# Patient Record
Sex: Male | Born: 1990
Health system: Southern US, Community
[De-identification: ages and names within clinical notes are randomized; demographics above are authoritative.]

## PROBLEM LIST (undated history)

## (undated) DIAGNOSIS — E119 Type 2 diabetes mellitus without complications: Secondary | ICD-10-CM

## (undated) HISTORY — PX: NO PAST SURGERIES: SHX2092

---
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
## 2010-05-23 ENCOUNTER — Ambulatory Visit (INDEPENDENT_AMBULATORY_CARE_PROVIDER_SITE_OTHER): Payer: BC Managed Care – PPO

## 2010-05-23 ENCOUNTER — Inpatient Hospital Stay (INDEPENDENT_AMBULATORY_CARE_PROVIDER_SITE_OTHER)
Admission: RE | Admit: 2010-05-23 | Discharge: 2010-05-23 | Disposition: A | Discharge status: Home / Self Care | Payer: BC Managed Care – PPO | Source: Ambulatory Visit | Attending: Family Medicine | Admitting: Family Medicine

## 2010-05-23 DIAGNOSIS — M779 Enthesopathy, unspecified: Secondary | ICD-10-CM

## 2011-02-10 ENCOUNTER — Emergency Department (INDEPENDENT_AMBULATORY_CARE_PROVIDER_SITE_OTHER)
Admission: EM | Admit: 2011-02-10 | Discharge: 2011-02-10 | Disposition: A | Discharge status: Home / Self Care | Payer: Self-pay | Source: Home / Self Care | Attending: Family Medicine | Admitting: Family Medicine

## 2011-02-10 DIAGNOSIS — J069 Acute upper respiratory infection, unspecified: Secondary | ICD-10-CM

## 2011-02-10 NOTE — ED Notes (Signed)
Patient complains of headache, dizziness, nausea and weakness since yesterday.  MD in room examining patient.

## 2011-02-10 NOTE — ED Provider Notes (Signed)
History     CSN: [id number] Arrival date & time: 02/10/2011  6:31 PM   First MD Initiated Contact with Patient 02/10/11 1810      No chief complaint on file.   (Consider location/radiation/quality/duration/timing/severity/associated sxs/prior treatment) HPI Comments: Dylan Watts presents for evaluation of fever, body aches, nausea, and non-productive cough since yesterday. He reports decreased appetite.   Patient is a 20 y.o. male presenting with URI. The history is provided by the patient.  URI The primary symptoms include fever, fatigue, headaches, sore throat, cough, nausea, vomiting and myalgias. The current episode started yesterday. This is a new problem. The problem has not changed since onset. The fever began yesterday. The fever has been unchanged since its onset. The maximum temperature recorded prior to his arrival was unknown.  The headache began yesterday. Headache is a new problem. The headache is not associated with aura, photophobia, eye pain or paresthesias.  Symptoms associated with the illness include chills, congestion and rhinorrhea.    No past medical history on file.  No past surgical history on file.  No family history on file.  History  Substance Use Topics  . Smoking status: Not on file  . Smokeless tobacco: Not on file  . Alcohol Use: Not on file      Review of Systems  Constitutional: Positive for fever, chills and fatigue.  HENT: Positive for congestion, sore throat, rhinorrhea and voice change.   Eyes: Negative.  Negative for photophobia and pain.  Respiratory: Positive for cough.   Gastrointestinal: Positive for nausea and vomiting.  Genitourinary: Negative.   Musculoskeletal: Positive for myalgias.  Skin: Negative.   Neurological: Positive for headaches. Negative for paresthesias.    Allergies  Review of patient's allergies indicates not on file.  Home Medications  No current outpatient prescriptions on file.  BP 128/86  Pulse 110   Temp(Src) 100.1 F (37.8 C) (Oral)  Resp 20  SpO2 98%  Physical Exam  Constitutional: He is oriented to person, place, and time. He appears well-developed and well-nourished.  HENT:  Head: Normocephalic and atraumatic.  Right Ear: Tympanic membrane and external ear normal.  Left Ear: Tympanic membrane and external ear normal.  Mouth/Throat: Uvula is midline, oropharynx is clear and moist and mucous membranes are normal. No oropharyngeal exudate, posterior oropharyngeal edema or posterior oropharyngeal erythema.  Eyes: Conjunctivae and EOM are normal. Pupils are equal, round, and reactive to light.  Neck: Normal range of motion.  Cardiovascular: Normal rate and regular rhythm.   Pulmonary/Chest: Effort normal and breath sounds normal. He has no wheezes. He has no rhonchi. He has no rales.  Neurological: He is alert and oriented to person, place, and time.  Skin: Skin is warm and dry.    ED Course  Procedures (including critical care time)  Labs Reviewed - No data to display No results found.   No diagnosis found.    MDM          Dylan Priest, MD 02/10/11 [phone]

## 2011-02-10 NOTE — ED Notes (Signed)
Patient given discharge instructions by Dr Juanetta Gosling, expressed understanding and had all questions answered.  Pt given work note to return on Monday

## 2011-02-17 NOTE — ED Notes (Signed)
Patient triaged by Rosalie Gums, rad tech.  This nurse did not assess or collect data for this patient

## 2014-11-28 ENCOUNTER — Encounter (HOSPITAL_COMMUNITY): Payer: Self-pay

## 2014-11-28 ENCOUNTER — Emergency Department (HOSPITAL_COMMUNITY): Payer: BLUE CROSS/BLUE SHIELD

## 2014-11-28 ENCOUNTER — Emergency Department (HOSPITAL_COMMUNITY)
Admission: EM | Admit: 2014-11-28 | Discharge: 2014-11-28 | Disposition: A | Discharge status: Home / Self Care | Payer: BLUE CROSS/BLUE SHIELD | Attending: Emergency Medicine | Admitting: Emergency Medicine

## 2014-11-28 DIAGNOSIS — R630 Anorexia: Secondary | ICD-10-CM | POA: Insufficient documentation

## 2014-11-28 DIAGNOSIS — R55 Syncope and collapse: Secondary | ICD-10-CM | POA: Diagnosis not present

## 2014-11-28 DIAGNOSIS — R000 Tachycardia, unspecified: Secondary | ICD-10-CM | POA: Insufficient documentation

## 2014-11-28 DIAGNOSIS — R110 Nausea: Secondary | ICD-10-CM | POA: Insufficient documentation

## 2014-11-28 DIAGNOSIS — R42 Dizziness and giddiness: Secondary | ICD-10-CM | POA: Insufficient documentation

## 2014-11-28 DIAGNOSIS — J069 Acute upper respiratory infection, unspecified: Secondary | ICD-10-CM | POA: Diagnosis not present

## 2014-11-28 LAB — CBC
HCT: 41.2 % (ref 39.0–52.0)
HEMOGLOBIN: 13.8 g/dL (ref 13.0–17.0)
MCH: 29.1 pg (ref 26.0–34.0)
MCHC: 33.5 g/dL (ref 30.0–36.0)
MCV: 86.9 fL (ref 78.0–100.0)
Platelets: 313 10*3/uL (ref 150–400)
RBC: 4.74 MIL/uL (ref 4.22–5.81)
RDW: 12.9 % (ref 11.5–15.5)
WBC: 16.1 10*3/uL — ABNORMAL HIGH (ref 4.0–10.5)

## 2014-11-28 LAB — BASIC METABOLIC PANEL
ANION GAP: 11 (ref 5–15)
BUN: 6 mg/dL (ref 6–20)
CALCIUM: 9.2 mg/dL (ref 8.9–10.3)
CO2: 25 mmol/L (ref 22–32)
Chloride: 98 mmol/L — ABNORMAL LOW (ref 101–111)
Creatinine, Ser: 1.15 mg/dL (ref 0.61–1.24)
GFR calc non Af Amer: >60 mL/min (ref >60)
Glucose, Bld: 98 mg/dL (ref 65–99)
Potassium: 3.7 mmol/L (ref 3.5–5.1)
Sodium: 134 mmol/L — ABNORMAL LOW (ref 135–145)

## 2014-11-28 LAB — URINE MICROSCOPIC-ADD ON

## 2014-11-28 LAB — URINALYSIS, ROUTINE W REFLEX MICROSCOPIC
GLUCOSE, UA: NEGATIVE mg/dL
Ketones, ur: 15 mg/dL — AB
LEUKOCYTES UA: NEGATIVE
Nitrite: NEGATIVE
PROTEIN: 30 mg/dL — AB
Specific Gravity, Urine: 1.024 (ref 1.005–1.030)
UROBILINOGEN UA: 1 mg/dL (ref 0.0–1.0)
pH: 5.5 (ref 5.0–8.0)

## 2014-11-28 LAB — CBG MONITORING, ED: GLUCOSE-CAPILLARY: 79 mg/dL (ref 65–99)

## 2014-11-28 NOTE — Discharge Instructions (Signed)
Syncope °Syncope is a medical term for fainting or passing out. This means you lose consciousness and drop to the ground. People are generally unconscious for less than 5 minutes. You may have some muscle twitches for up to 15 seconds before waking up and returning to normal. Syncope occurs more often in older adults, but it can happen to anyone. While most causes of syncope are not dangerous, syncope can be a sign of a serious medical problem. It is important to seek medical care.  °CAUSES  °Syncope is caused by a sudden drop in blood flow to the brain. The specific cause is often not determined. Factors that can bring on syncope include: °· Taking medicines that lower blood pressure. °· Sudden changes in posture, such as standing up quickly. °· Taking more medicine than prescribed. °· Standing in one place for too long. °· Seizure disorders. °· Dehydration and excessive exposure to heat. °· Low blood sugar (hypoglycemia). °· Straining to have a bowel movement. °· Heart disease, irregular heartbeat, or other circulatory problems. °· Fear, emotional distress, seeing blood, or severe pain. °SYMPTOMS  °Right before fainting, you may: °· Feel dizzy or light-headed. °· Feel nauseous. °· See all white or all black in your field of vision. °· Have cold, clammy skin. °DIAGNOSIS  °Your health care provider will ask about your symptoms, perform a physical exam, and perform an electrocardiogram (ECG) to record the electrical activity of your heart. Your health care provider may also perform other heart or blood tests to determine the cause of your syncope which may include: °· Transthoracic echocardiogram (TTE). During echocardiography, sound waves are used to evaluate how blood flows through your heart. °· Transesophageal echocardiogram (TEE). °· Cardiac monitoring. This allows your health care provider to monitor your heart rate and rhythm in real time. °· Holter monitor. This is a portable device that records your  heartbeat and can help diagnose heart arrhythmias. It allows your health care provider to track your heart activity for several days, if needed. °· Stress tests by exercise or by giving medicine that makes the heart beat faster. °TREATMENT  °In most cases, no treatment is needed. Depending on the cause of your syncope, your health care provider may recommend changing or stopping some of your medicines. °HOME CARE INSTRUCTIONS °· Have someone stay with you until you feel stable. °· Do not drive, use machinery, or play sports until your health care provider says it is okay. °· Keep all follow-up appointments as directed by your health care provider. °· Lie down right away if you start feeling like you might faint. Breathe deeply and steadily. Wait until all the symptoms have passed. °· Drink enough fluids to keep your urine clear or pale yellow. °· If you are taking blood pressure or heart medicine, get up slowly and take several minutes to sit and then stand. This can reduce dizziness. °SEEK IMMEDIATE MEDICAL CARE IF:  °· You have a severe headache. °· You have unusual pain in the chest, abdomen, or back. °· You are bleeding from your mouth or rectum, or you have black or tarry stool. °· You have an irregular or very fast heartbeat. °· You have pain with breathing. °· You have repeated fainting or seizure-like jerking during an episode. °· You faint when sitting or lying down. °· You have confusion. °· You have trouble walking. °· You have severe weakness. °· You have vision problems. °If you fainted, call your local emergency services (911 in [place] Do not drive   yourself to the hospital.  MAKE SURE YOU:  Understand these instructions.  Will watch your condition.  Will get help right away if you are not doing well or get worse. Document Released: 03/05/2005 Document Revised: 03/10/2013 Document Reviewed: 05/04/2011 [facility] Patient Information 2015 [place], [place]. This information is not intended to replace  advice given to you by your health care provider. Make sure you discuss any questions you have with your health care provider.  Upper Respiratory Infection, Adult An upper respiratory infection (URI) is also sometimes known as the common cold. The upper respiratory tract includes the nose, sinuses, throat, trachea, and bronchi. Bronchi are the airways leading to the lungs. Most people improve within 1 week, but symptoms can last up to 2 weeks. A residual cough may last even longer.  CAUSES Many different viruses can infect the tissues lining the upper respiratory tract. The tissues become irritated and inflamed and often become very moist. Mucus production is also common. A cold is contagious. You can easily spread the virus to others by oral contact. This includes kissing, sharing a glass, coughing, or sneezing. Touching your mouth or nose and then touching a surface, which is then touched by another person, can also spread the virus. SYMPTOMS  Symptoms typically develop 1 to 3 days after you come in contact with a cold virus. Symptoms vary from person to person. They may include:  Runny nose.  Sneezing.  Nasal congestion.  Sinus irritation.  Sore throat.  Loss of voice (laryngitis).  Cough.  Fatigue.  Muscle aches.  Loss of appetite.  Headache.  Low-grade fever. DIAGNOSIS  You might diagnose your own cold based on familiar symptoms, since most people get a cold 2 to 3 times a year. Your caregiver can confirm this based on your exam. Most importantly, your caregiver can check that your symptoms are not due to another disease such as strep throat, sinusitis, pneumonia, asthma, or epiglottitis. Blood tests, throat tests, and X-rays are not necessary to diagnose a common cold, but they may sometimes be helpful in excluding other more serious diseases. Your caregiver will decide if any further tests are required. RISKS AND COMPLICATIONS  You may be at risk for a more severe case of  the common cold if you smoke cigarettes, have chronic heart disease (such as heart failure) or lung disease (such as asthma), or if you have a weakened immune system. The very young and very old are also at risk for more serious infections. Bacterial sinusitis, middle ear infections, and bacterial pneumonia can complicate the common cold. The common cold can worsen asthma and chronic obstructive pulmonary disease (COPD). Sometimes, these complications can require emergency medical care and may be life-threatening. PREVENTION  The best way to protect against getting a cold is to practice good hygiene. Avoid oral or hand contact with people with cold symptoms. Wash your hands often if contact occurs. There is no clear evidence that vitamin C, vitamin E, echinacea, or exercise reduces the chance of developing a cold. However, it is always recommended to get plenty of rest and practice good nutrition. TREATMENT  Treatment is directed at relieving symptoms. There is no cure. Antibiotics are not effective, because the infection is caused by a virus, not by bacteria. Treatment may include:  Increased fluid intake. Sports drinks offer valuable electrolytes, sugars, and fluids.  Breathing heated mist or steam (vaporizer or shower).  Eating chicken soup or other clear broths, and maintaining good nutrition.  Getting plenty of rest.  Using  gargles or lozenges for comfort.  Controlling fevers with ibuprofen or acetaminophen as directed by your caregiver.  Increasing usage of your inhaler if you have asthma. Zinc gel and zinc lozenges, taken in the first 24 hours of the common cold, can shorten the duration and lessen the severity of symptoms. Pain medicines may help with fever, muscle aches, and throat pain. A variety of non-prescription medicines are available to treat congestion and runny nose. Your caregiver can make recommendations and may suggest nasal or lung inhalers for other symptoms.  HOME CARE  INSTRUCTIONS   Only take over-the-counter or prescription medicines for pain, discomfort, or fever as directed by your caregiver.  Use a warm mist humidifier or inhale steam from a shower to increase air moisture. This may keep secretions moist and make it easier to breathe.  Drink enough water and fluids to keep your urine clear or pale yellow.  Rest as needed.  Return to work when your temperature has returned to normal or as your caregiver advises. You may need to stay home longer to avoid infecting others. You can also use a face mask and careful hand washing to prevent spread of the virus. SEEK MEDICAL CARE IF:   After the first few days, you feel you are getting worse rather than better.  You need your caregiver's advice about medicines to control symptoms.  You develop chills, worsening shortness of breath, or brown or red sputum. These may be signs of pneumonia.  You develop yellow or brown nasal discharge or pain in the face, especially when you bend forward. These may be signs of sinusitis.  You develop a fever, swollen neck glands, pain with swallowing, or white areas in the back of your throat. These may be signs of strep throat. SEEK IMMEDIATE MEDICAL CARE IF:   You have a fever.  You develop severe or persistent headache, ear pain, sinus pain, or chest pain.  You develop wheezing, a prolonged cough, cough up blood, or have a change in your usual mucus (if you have chronic lung disease).  You develop sore muscles or a stiff neck. Document Released: 08/29/2000 Document Revised: 05/28/2011 Document Reviewed: 06/10/2013 [facility] Patient Information 2015 [place], [place]. This information is not intended to replace advice given to you by your health care provider. Make sure you discuss any questions you have with your health care provider.

## 2014-11-28 NOTE — ED Notes (Signed)
PA at the bedside.

## 2014-11-28 NOTE — ED Notes (Signed)
CBG 79 

## 2014-11-28 NOTE — ED Notes (Signed)
Patient returned from X-ray 

## 2014-11-28 NOTE — ED Provider Notes (Signed)
CSN: [id number]     Arrival date & time 11/28/14  1631 History   First MD Initiated Contact with Patient 11/28/14 1655     Chief Complaint  Patient presents with  . Loss of Consciousness   HPI  Mr. Trivedi is a 24 year old male presenting after LOC. Pt states he has been sick with a cold for the past 2-3 days. He endorses chills, congestion, headache, cough and rhinorrhea. Pt states he has been sleeping a lot and not eating much over the past few days and has not eaten or drank at all today. While at work, he began to feel dizzy and passed out. Bystanders report patient "slumped to the floor" with no arm or leg jerking. Pt states he regained consciousness quickly and was able to stand up and walk without further dizziness. Denies hitting his head or injuries sustained in the fall. Denies fevers, sweats, sinus pressure, sore throat, chest pain, SOB, abdominal pain, vomiting, diarrhea, muscle aches, weakness or headaches. Pt states his dizziness resolved after his syncopal event and he is no longer dizzy or lightheaded.   History reviewed. No pertinent past medical history. History reviewed. No pertinent past surgical history. No family history on file. Social History  Substance Use Topics  . Smoking status: Never Smoker   . Smokeless tobacco: Never Used  . Alcohol Use: Yes     Comment: socially    Review of Systems  Constitutional: Positive for chills and appetite change. Negative for fever and diaphoresis.  HENT: Positive for congestion and rhinorrhea. Negative for sore throat.   Respiratory: Negative for shortness of breath.   Cardiovascular: Negative for chest pain.  Gastrointestinal: Positive for nausea. Negative for vomiting, abdominal pain and diarrhea.  Musculoskeletal: Negative for myalgias, back pain, joint swelling, arthralgias, gait problem and neck pain.  Skin: Negative for wound.  Neurological: Positive for dizziness and syncope. Negative for weakness, numbness and headaches.       Allergies  Review of patient's allergies indicates no known allergies.  Home Medications   Prior to Admission medications   Not on File   BP 130/71 mmHg  Pulse 108  Temp(Src) 97.4 F (36.3 C) (Oral)  Resp 10  SpO2 99% Physical Exam  Constitutional: He is oriented to person, place, and time. He appears well-developed and well-nourished. No distress.  HENT:  Head: Normocephalic and atraumatic.  Nose: Mucosal edema and rhinorrhea present. Right sinus exhibits no maxillary sinus tenderness and no frontal sinus tenderness. Left sinus exhibits no maxillary sinus tenderness and no frontal sinus tenderness.  Mouth/Throat: Uvula is midline, oropharynx is clear and moist and mucous membranes are normal. No oropharyngeal exudate, posterior oropharyngeal edema or posterior oropharyngeal erythema.  Eyes: Conjunctivae and EOM are normal. Pupils are equal, round, and reactive to light.  Neck: Normal range of motion.  Cardiovascular: Normal rate, regular rhythm and normal heart sounds.   Pulmonary/Chest: Effort normal and breath sounds normal. No respiratory distress. He has no wheezes. He has no rales.  Abdominal: Soft. There is no tenderness. There is no rebound and no guarding.  Musculoskeletal: Normal range of motion.  Ambulatory. Moving extremities spontaneously without pain  Neurological: He is alert and oriented to person, place, and time. No cranial nerve deficit.  5/5 motor strength upper and lower extremities. Sensation to light touch intact throughout.  Skin: Skin is warm and dry.  Psychiatric: He has a normal mood and affect. His behavior is normal.  Nursing note and vitals reviewed.   ED  Course  Procedures (including critical care time) Labs Review Labs Reviewed  BASIC METABOLIC PANEL - Abnormal; Notable for the following:    Sodium 134 (*)    Chloride 98 (*)    All other components within normal limits  CBC - Abnormal; Notable for the following:    WBC 16.1 (*)     All other components within normal limits  URINALYSIS, ROUTINE W REFLEX MICROSCOPIC (NOT AT [facility]) - Abnormal; Notable for the following:    Color, Urine AMBER (*)    Hgb urine dipstick MODERATE (*)    Bilirubin Urine SMALL (*)    Ketones, ur 15 (*)    Protein, ur 30 (*)    All other components within normal limits  URINE MICROSCOPIC-ADD ON - Abnormal; Notable for the following:    Bacteria, UA FEW (*)    Casts HYALINE CASTS (*)    All other components within normal limits  CBG MONITORING, ED    Imaging Review Dg Chest 2 View  11/28/2014   CLINICAL DATA:  Weakness. Cold like symptoms for 3 days. Syncopal episode.  EXAM: CHEST  2 VIEW  COMPARISON:  None.  FINDINGS: Cardiopericardial silhouette within normal limits. Mediastinal contours normal. Trachea midline. No airspace disease or effusion. RIGHT-greater-than-LEFT basilar atelectasis. No focal consolidation.  IMPRESSION: No active cardiopulmonary disease.   Electronically Signed   By: Andreas Newport M.D.   On: 11/28/2014 20:12   I have personally reviewed and evaluated these images and lab results as part of my medical decision-making.   EKG Interpretation   Date/Time:  Sunday November 28 2014 16:39:16 EDT Ventricular Rate:  129 PR Interval:  131 QRS Duration: 77 QT Interval:  291 QTC Calculation: 426 R Axis:   49 Text Interpretation:  Sinus tachycardia Borderline T abnormalities,  diffuse leads No previous ECGs available Confirmed by [facility] MD, ERIN  ([id number]) on 11/28/2014 6:46:16 PM      MDM   Final diagnoses:  Syncope, unspecified syncope type  URI (upper respiratory infection)   Pt presenting after LOC at work. Pt reports he has had chills, congestion, headache, cough, fatigue and decreased appetite for past 2-3 days. Pt was at work today and had syncopal event. Bystanders deny seizure-like activity. Pt denies head injury. Pt tachycardic. Pt nontoxic, resting comfortably and denying current dizziness. Non-focal  neuro exam. Heart RRR. Lungs CTAB. WBC 16.1. CBG 79. CXR negative for cardiopulmonary disease. Pt received 1 L bolus in ED. Pt most likely with combination of viral URI and dehydration resulting in syncopal event. Discussed pt with Dr. Dalene Seltzer who agrees with treatment plan. Return precautions given in discharge paperwork and discussed with pt. Pt stable for discharge.     Rolm Gala Ayerim Berquist, PA-C 11/28/14 2159  Alvira Monday, MD 12/02/14 1217

## 2014-11-28 NOTE — ED Notes (Signed)
Per EMS, Patient was at work and hadn't eaten today. Patient has had cold like symptoms for about three days. Patient started to feel weak at work and was in line getting a drink when patient had a witnessed syncopal episode. Patient fell on tile, denies hitting his head. Bystanders report patient "slumped to the floor" with no seizure like activity. Vitals per EMS: 121/74, 115 HR, 16 RR, 119 CBG. Alert and Oriented x4 upon arrival.

## 2016-08-14 ENCOUNTER — Encounter (HOSPITAL_COMMUNITY): Payer: Self-pay | Admitting: Emergency Medicine

## 2016-08-14 ENCOUNTER — Ambulatory Visit (HOSPITAL_COMMUNITY)
Admission: EM | Admit: 2016-08-14 | Discharge: 2016-08-14 | Disposition: A | Discharge status: Home / Self Care | Payer: BLUE CROSS/BLUE SHIELD | Attending: Family Medicine | Admitting: Family Medicine

## 2016-08-14 DIAGNOSIS — K580 Irritable bowel syndrome with diarrhea: Secondary | ICD-10-CM

## 2016-08-14 LAB — POCT URINALYSIS DIP (DEVICE)
BILIRUBIN URINE: NEGATIVE
Glucose, UA: NEGATIVE mg/dL
HGB URINE DIPSTICK: NEGATIVE
Ketones, ur: NEGATIVE mg/dL
LEUKOCYTES UA: NEGATIVE
Nitrite: NEGATIVE
Protein, ur: NEGATIVE mg/dL
SPECIFIC GRAVITY, URINE: 1.025 (ref 1.005–1.030)
Urobilinogen, UA: 0.2 mg/dL (ref 0.0–1.0)
pH: 5.5 (ref 5.0–8.0)

## 2016-08-14 MED ORDER — DICYCLOMINE HCL 20 MG PO TABS: 20.0000 mg | ORAL_TABLET | Freq: Four times a day (QID) | ORAL | 1 refills | Status: DC | PRN

## 2016-08-14 NOTE — Medical Student Note (Signed)
[facility] Provider Student Note For educational purposes for Medical, PA and NP students only and not part of the legal medical record.   CSN: [id number] Arrival date & time: 08/14/16  1318     History   Chief Complaint Chief Complaint  Patient presents with  . Abdominal Pain    HPI Dylan GageQuinton Watts is a 26 y.o. male.  Pt is a 26 year old male that presents to the clinic today with reoccurent abd pain with diarrhea for the past 6 months. Sts that the episodes usually last about 2 days with 4 or 5 episodes of runny diarrhea. Denies any vomiting. sts when the episodes come he has diaphoresis and chills. sts the pain is generalized and radiates into RLQ, right flank and back. sts that his urine has been dark. Denies any dysuria or penile discharge. Denies recent travel or fevers.    The history is provided by the patient.  Abdominal Pain  Pain location:  Generalized Pain quality: bloating and cramping   Pain radiates to:  RLQ, back and R flank Pain severity:  Mild Onset quality:  Gradual Timing:  Intermittent Progression:  Waxing and waning Chronicity:  Chronic Context: not alcohol use, not diet changes, not laxative use, not recent illness, not recent travel, not sick contacts and not suspicious food intake   Relieved by:  Bowel activity Worsened by:  Nothing Associated symptoms: chills, diarrhea, fatigue and nausea   Associated symptoms: no constipation, no dysuria, no fever, no melena and no vomiting   Diarrhea:    Quality:  Watery   Number of occurrences:  5   Severity:  Moderate   Timing:  Intermittent   History reviewed. No pertinent past medical history.  There are no active problems to display for this patient.   History reviewed. No pertinent surgical history.     Home Medications    Prior to Admission medications   Not on File    Family History History reviewed. No pertinent family history.  Social History Social History  Substance  Use Topics  . Smoking status: Never Smoker  . Smokeless tobacco: Never Used  . Alcohol use Yes     Comment: socially     Allergies   Patient has no known allergies.   Review of Systems Review of Systems  Constitutional: Positive for chills and fatigue. Negative for fever.  HENT: Negative.   Eyes: Negative.   Respiratory: Negative.   Cardiovascular: Negative.   Gastrointestinal: Positive for abdominal pain, diarrhea and nausea. Negative for constipation, melena and vomiting.  Endocrine: Negative.   Genitourinary: Positive for flank pain. Negative for difficulty urinating, discharge, dysuria, frequency, penile pain and urgency.  Musculoskeletal: Positive for back pain.  Skin: Negative.   Allergic/Immunologic: Negative.   Neurological: Negative.   Hematological: Negative.   Psychiatric/Behavioral: Negative.      Physical Exam Updated Vital Signs BP (!) 153/109 (BP Location: Left Wrist)   Pulse (!) 111   Temp 99.3 F (37.4 C) (Oral)   Resp 20   SpO2 98%   Physical Exam  Constitutional: He is oriented to person, place, and time. He appears well-developed and well-nourished. No distress.  HENT:  Head: Normocephalic and atraumatic.  Right Ear: External ear normal.  Left Ear: External ear normal.  Nose: Nose normal.  Mouth/Throat: Oropharynx is clear and moist.  Eyes: Conjunctivae and EOM are normal. Pupils are equal, round, and reactive to light.  Neck: Normal range of motion. Neck supple.  Cardiovascular: Normal  rate, regular rhythm and normal heart sounds.   Pulmonary/Chest: Effort normal and breath sounds normal.  Abdominal: Soft. Bowel sounds are normal. He exhibits no distension and no mass. There is tenderness. There is no rebound and no guarding. No hernia.  Pt exhibits tenderness to palpation of mid abdominal area, RLQ, LLQ. No rebound tenderness. Positive for CVA tenderness and tenderness to lower back.   Musculoskeletal: Normal range of motion.  Neurological:  He is alert and oriented to person, place, and time.  Skin: Skin is warm and dry.  Psychiatric: He has a normal mood and affect.     ED Treatments / Results  Labs (all labs ordered are listed, but only abnormal results are displayed) Labs Reviewed - No data to display  EKG  EKG Interpretation None       Radiology No results found.  Procedures Procedures (including critical care time)  Medications Ordered in ED Medications - No data to display   Initial Impression / Assessment and Plan / ED Course  I have reviewed the triage vital signs and the nursing notes.  Pertinent labs & imaging results that were available during my care of the patient were reviewed by me and considered in my medical decision making (see chart for details).       Final Clinical Impressions(s) / ED Diagnoses   Final diagnoses:  None    New Prescriptions New Prescriptions   No medications on file

## 2016-08-14 NOTE — Discharge Instructions (Signed)
Based on your history, and physical exam findings, it is likely have irritable bowel syndrome. We have concluded the contact information for a GI office, contact their office to set up an appointment for further evaluation. I've also provided the contact information for community health and wellness, contact them to establish for primary care. For your symptoms, we have started you on a drug called Bentyl, take 1 tablet up to 4 times a day as needed for your symptoms. Increase dietary fiber, you can do this with over-the-counter Metamucil products, or fresh fruits and vegetables.

## 2016-08-14 NOTE — ED Triage Notes (Signed)
[facility] a [facility] pain with N/D that recurs monthly. The patient also reported lower back pain.

## 2016-08-14 NOTE — ED Provider Notes (Signed)
CSN: [id number]     Arrival date & time 08/14/16  1318 History   First MD Initiated Contact with Patient 08/14/16 1451     Chief Complaint  Patient presents with  . Abdominal Pain   (Consider location/radiation/quality/duration/timing/severity/associated sxs/prior Treatment) Dylan Watts is a 26 year old male who presents to the urgent care today with a chief complaint of recurrent abdominal pain, and diarrhea. This is been an ongoing problem for him for the past 6 months, with his symptoms waxing and waning. States when he usually gets an episode of the symptoms, the last 2-3 days, and he has 4-5 episodes of runny diarrhea. Denies any vomiting, but has had one episode of diaphoresis and chills. States pain is generalized, and does radiate to the right lower quadrant, right flank, and his back. He denies any dysuria, penile discharge, he has had no travel outside of the country, no fevers.   The history is provided by the patient.  Abdominal Pain  Associated symptoms: chills, diarrhea, fatigue and nausea   Associated symptoms: no constipation, no dysuria, no fever and no vomiting     History reviewed. No pertinent past medical history. History reviewed. No pertinent surgical history. History reviewed. No pertinent family history. Social History  Substance Use Topics  . Smoking status: Never Smoker  . Smokeless tobacco: Never Used  . Alcohol use Yes     Comment: socially    Review of Systems  Constitutional: Positive for chills and fatigue. Negative for fever.  HENT: Negative.   Eyes: Negative.   Respiratory: Negative.   Cardiovascular: Negative.   Gastrointestinal: Positive for abdominal pain, diarrhea and nausea. Negative for constipation and vomiting.  Genitourinary: Positive for flank pain. Negative for difficulty urinating, discharge, dysuria, penile pain and urgency.  Musculoskeletal: Positive for back pain.  Skin: Negative.   Neurological: Negative.     Allergies  Patient  has no known allergies.  Home Medications   Prior to Admission medications   Medication Sig Start Date End Date Taking? Authorizing Provider  dicyclomine (BENTYL) 20 MG tablet Take 1 tablet (20 mg total) by mouth 4 (four) times daily as needed for spasms. 08/14/16   Dylan Bodo, NP   Meds Ordered and Administered this Visit  Medications - No data to display  BP (!) 153/109 (BP Location: Left Wrist)   Pulse (!) 111   Temp 99.3 F (37.4 C) (Oral)   Resp 20   SpO2 98%  No data found.   Physical Exam  Constitutional: He is oriented to person, place, and time. He appears well-developed and well-nourished. No distress.  HENT:  Head: Normocephalic.  Right Ear: External ear normal.  Left Ear: External ear normal.  Eyes: Conjunctivae are normal.  Neck: Normal range of motion.  Cardiovascular: Normal rate and regular rhythm.   Pulmonary/Chest: Effort normal.  Abdominal: There is tenderness in the right lower quadrant, epigastric area, periumbilical area and left lower quadrant. There is CVA tenderness. There is no rebound, no tenderness at McBurney's point and negative Murphy's sign.  Neurological: He is alert and oriented to person, place, and time.  Skin: Skin is warm and dry. Capillary refill takes less than 2 seconds. He is not diaphoretic.  Psychiatric: He has a normal mood and affect. His behavior is normal.  Nursing note and vitals reviewed.   Urgent Care Course     Procedures (including critical care time)  Labs Review Labs Reviewed  POCT URINALYSIS DIP (DEVICE)    Imaging Review No results found.  MDM   1. Irritable bowel syndrome with diarrhea    Patient's symptoms are consistent with rome criteria, for irritable bowel syndrome. Started on Bentyl, right counseling on diet, and fiber intake, provided contact information for gastroenterology, and for community health and wellness. Follow-up with GI for further evaluation of these symptoms.    Dylan Watts,  [place], NP 08/14/16 1534

## 2016-08-14 NOTE — ED Notes (Signed)
Dirty and clean urine collected. 

## 2016-12-27 ENCOUNTER — Other Ambulatory Visit: Payer: Self-pay

## 2016-12-27 ENCOUNTER — Encounter (HOSPITAL_COMMUNITY): Payer: Self-pay | Admitting: Emergency Medicine

## 2016-12-27 ENCOUNTER — Emergency Department (HOSPITAL_COMMUNITY): Payer: 59

## 2016-12-27 DIAGNOSIS — R911 Solitary pulmonary nodule: Secondary | ICD-10-CM | POA: Diagnosis not present

## 2016-12-27 DIAGNOSIS — R0981 Nasal congestion: Secondary | ICD-10-CM | POA: Diagnosis not present

## 2016-12-27 DIAGNOSIS — J40 Bronchitis, not specified as acute or chronic: Secondary | ICD-10-CM | POA: Insufficient documentation

## 2016-12-27 DIAGNOSIS — R0602 Shortness of breath: Secondary | ICD-10-CM | POA: Diagnosis present

## 2016-12-27 DIAGNOSIS — R05 Cough: Secondary | ICD-10-CM | POA: Insufficient documentation

## 2016-12-27 LAB — CBC WITH DIFFERENTIAL/PLATELET
BASOS ABS: 0 10*3/uL (ref 0.0–0.1)
BASOS PCT: 0 %
EOS PCT: 3 %
Eosinophils Absolute: 0.4 10*3/uL (ref 0.0–0.7)
HCT: 40.4 % (ref 39.0–52.0)
Hemoglobin: 13 g/dL (ref 13.0–17.0)
LYMPHS PCT: 13 %
Lymphs Abs: 1.7 10*3/uL (ref 0.7–4.0)
MCH: 27.5 pg (ref 26.0–34.0)
MCHC: 32.2 g/dL (ref 30.0–36.0)
MCV: 85.4 fL (ref 78.0–100.0)
MONO ABS: 1.8 10*3/uL — AB (ref 0.1–1.0)
Monocytes Relative: 13 %
NEUTROS ABS: 9.8 10*3/uL — AB (ref 1.7–7.7)
Neutrophils Relative %: 71 %
PLATELETS: 316 10*3/uL (ref 150–400)
RBC: 4.73 MIL/uL (ref 4.22–5.81)
RDW: 13.4 % (ref 11.5–15.5)
WBC: 13.8 10*3/uL — AB (ref 4.0–10.5)

## 2016-12-27 LAB — BASIC METABOLIC PANEL
ANION GAP: 9 (ref 5–15)
BUN: 7 mg/dL (ref 6–20)
CALCIUM: 9 mg/dL (ref 8.9–10.3)
CO2: 24 mmol/L (ref 22–32)
Chloride: 99 mmol/L — ABNORMAL LOW (ref 101–111)
Creatinine, Ser: 0.81 mg/dL (ref 0.61–1.24)
Glucose, Bld: 109 mg/dL — ABNORMAL HIGH (ref 65–99)
POTASSIUM: 4 mmol/L (ref 3.5–5.1)
Sodium: 132 mmol/L — ABNORMAL LOW (ref 135–145)

## 2016-12-27 MED ORDER — ALBUTEROL SULFATE (2.5 MG/3ML) 0.083% IN NEBU
INHALATION_SOLUTION | RESPIRATORY_TRACT | Status: AC
  Filled 2016-12-27: qty 15

## 2016-12-27 MED ORDER — ALBUTEROL SULFATE (2.5 MG/3ML) 0.083% IN NEBU
5.0000 mg | INHALATION_SOLUTION | Freq: Once | RESPIRATORY_TRACT | Status: AC
  Administered 2016-12-27: 5 mg via RESPIRATORY_TRACT

## 2016-12-27 NOTE — ED Triage Notes (Signed)
Patient reports SOB , productive cough , nasal congestion/rhinorrhea onset yesterday . Denies fever or chills .

## 2016-12-28 ENCOUNTER — Encounter (HOSPITAL_COMMUNITY): Payer: Self-pay

## 2016-12-28 ENCOUNTER — Emergency Department (HOSPITAL_COMMUNITY)
Admission: EM | Admit: 2016-12-28 | Discharge: 2016-12-28 | Disposition: A | Discharge status: Home / Self Care | Payer: 59 | Attending: Emergency Medicine | Admitting: Emergency Medicine

## 2016-12-28 ENCOUNTER — Emergency Department (HOSPITAL_COMMUNITY): Payer: 59

## 2016-12-28 DIAGNOSIS — J40 Bronchitis, not specified as acute or chronic: Secondary | ICD-10-CM

## 2016-12-28 DIAGNOSIS — R911 Solitary pulmonary nodule: Secondary | ICD-10-CM

## 2016-12-28 LAB — D-DIMER, QUANTITATIVE: D-Dimer, Quant: 0.73 ug/mL-FEU — ABNORMAL HIGH (ref 0.00–0.50)

## 2016-12-28 MED ORDER — PREDNISONE 10 MG (21) PO TBPK: ORAL_TABLET | Freq: Every day | ORAL | 1 refills | Status: DC

## 2016-12-28 MED ORDER — SODIUM CHLORIDE 0.9 % IV SOLN: INTRAVENOUS | Status: DC

## 2016-12-28 MED ORDER — IOPAMIDOL (ISOVUE-370) INJECTION 76%
INTRAVENOUS | Status: AC
  Administered 2016-12-28: 100 mL
  Filled 2016-12-28: qty 100

## 2016-12-28 MED ORDER — ALBUTEROL SULFATE (2.5 MG/3ML) 0.083% IN NEBU
5.0000 mg | INHALATION_SOLUTION | Freq: Once | RESPIRATORY_TRACT | Status: AC
  Administered 2016-12-28: 5 mg via RESPIRATORY_TRACT
  Filled 2016-12-28 (×2): qty 6

## 2016-12-28 MED ORDER — SODIUM CHLORIDE 0.9 % IV BOLUS (SEPSIS)
1000.0000 mL | Freq: Once | INTRAVENOUS | Status: AC
  Administered 2016-12-28: 1000 mL via INTRAVENOUS

## 2016-12-28 MED ORDER — ALBUTEROL SULFATE HFA 108 (90 BASE) MCG/ACT IN AERS: 2.0000 | INHALATION_SPRAY | Freq: Four times a day (QID) | RESPIRATORY_TRACT | 2 refills | Status: DC | PRN

## 2016-12-28 MED ORDER — METHYLPREDNISOLONE SODIUM SUCC 125 MG IJ SOLR
125.0000 mg | Freq: Once | INTRAMUSCULAR | Status: AC
  Administered 2016-12-28: 125 mg via INTRAVENOUS
  Filled 2016-12-28: qty 2

## 2016-12-28 NOTE — ED Notes (Signed)
Patient transported to CT 

## 2016-12-28 NOTE — ED Provider Notes (Signed)
[facility] Provider Note   CSN: [id number] Arrival date & time: 12/27/16  2220     History   Chief Complaint Chief Complaint  Patient presents with  . Shortness of Breath    Cough  . Nasal Congestion    HPI Dylan Watts is a 26 y.o. male.  26 year old male presents with cough and congestion 2 days. He has noted increased dyspnea on exertion as well without fever or chills. Has had emesis 3 today without diarrhea. No leg pain or swelling. States that the sharp chest pain to get better after receiving an albuterol treatment. No prior history of PE. Does smoke occasional marijuana. Denies any orthopnea or anginal type pain.      History reviewed. No pertinent past medical history.  There are no active problems to display for this patient.   History reviewed. No pertinent surgical history.     Home Medications    Prior to Admission medications   Medication Sig Start Date End Date Taking? Authorizing Provider  dicyclomine (BENTYL) 20 MG tablet Take 1 tablet (20 mg total) by mouth 4 (four) times daily as needed for spasms. 08/14/16   Dorena Bodo, NP    Family History No family history on file.  Social History Social History  Substance Use Topics  . Smoking status: Never Smoker  . Smokeless tobacco: Never Used  . Alcohol use Yes     Comment: socially     Allergies   Patient has no known allergies.   Review of Systems Review of Systems  All other systems reviewed and are negative.    Physical Exam Updated Vital Signs BP (!) 158/103 (BP Location: Left Arm)   Pulse 98   Temp 98.4 F (36.9 C) (Oral)   Resp 20   Ht 1.6 m ( )   Wt 95.7 kg (211 lb)   SpO2 99%   BMI 37.38 kg/m   Physical Exam  Constitutional: He is oriented to person, place, and time. He appears well-developed and well-nourished.  Non-toxic appearance. No distress.  HENT:  Head: Normocephalic and atraumatic.  Eyes: Pupils are equal, round, and reactive to light.  Conjunctivae, EOM and lids are normal.  Neck: Normal range of motion. Neck supple. No tracheal deviation present. No thyroid mass present.  Cardiovascular: Normal rate, regular rhythm and normal heart sounds.  Exam reveals no gallop.   No murmur heard. Pulmonary/Chest: Effort normal. No stridor. No respiratory distress. He has decreased breath sounds in the right lower field and the left lower field. He has no wheezes. He has no rhonchi. He has no rales.  Abdominal: Soft. Normal appearance and bowel sounds are normal. He exhibits no distension. There is no tenderness. There is no rebound and no CVA tenderness.  Musculoskeletal: Normal range of motion. He exhibits no edema or tenderness.  Neurological: He is alert and oriented to person, place, and time. He has normal strength. No cranial nerve deficit or sensory deficit. GCS eye subscore is 4. GCS verbal subscore is 5. GCS motor subscore is 6.  Skin: Skin is warm and dry. No abrasion and no rash noted.  Psychiatric: He has a normal mood and affect. His speech is normal and behavior is normal.  Nursing note and vitals reviewed.    ED Treatments / Results  Labs (all labs ordered are listed, but only abnormal results are displayed) Labs Reviewed  CBC WITH DIFFERENTIAL/PLATELET - Abnormal; Notable for the following:       Result Value   WBC  13.8 (*)    Neutro Abs 9.8 (*)    Monocytes Absolute 1.8 (*)    All other components within normal limits  BASIC METABOLIC PANEL - Abnormal; Notable for the following:    Sodium 132 (*)    Chloride 99 (*)    Glucose, Bld 109 (*)    All other components within normal limits  D-DIMER, QUANTITATIVE (NOT AT [facility])    EKG  EKG Interpretation  Date/Time:  Thursday December 27 2016 22:28:13 EDT Ventricular Rate:  115 PR Interval:  132 QRS Duration: 74 QT Interval:  310 QTC Calculation: 428 R Axis:   57 Text Interpretation:  Sinus tachycardia Otherwise normal ECG Confirmed by Lorre Nick ([id number]) on  12/28/2016 1:00:11 AM       Radiology Dg Chest 2 View  Result Date: 12/27/2016 CLINICAL DATA:  Dyspnea and productive cough, onset yesterday. EXAM: CHEST  2 VIEW COMPARISON:  11/28/2014 FINDINGS: The lungs are clear. The pulmonary vasculature is normal. Heart size is normal. Hilar and mediastinal contours are unremarkable. There is no pleural effusion. IMPRESSION: No active cardiopulmonary disease. Electronically Signed   By: Ellery Plunk M.D.   On: 12/27/2016 23:21    Procedures Procedures (including critical care time)  Medications Ordered in ED Medications  sodium chloride 0.9 % bolus 1,000 mL (not administered)  0.9 %  sodium chloride infusion (not administered)  methylPREDNISolone sodium succinate (SOLU-MEDROL) 125 mg/2 mL injection 125 mg (not administered)  albuterol (PROVENTIL) (2.5 MG/3ML) 0.083% nebulizer solution 5 mg (not administered)  albuterol (PROVENTIL) (2.5 MG/3ML) 0.083% nebulizer solution 5 mg (5 mg Nebulization Given 12/27/16 2234)     Initial Impression / Assessment and Plan / ED Course  I have reviewed the triage vital signs and the nursing notes.  Pertinent labs & imaging results that were available during my care of the patient were reviewed by me and considered in my medical decision making (see chart for details).     Patient treated for likely bronchitis with albuterol and Solu-Medrol and he felt better. Had significant tachycardia as well as some tachypnea and d-dimer was elevated. Subsequent chest CT showed pulmonary nodule but no signs of infection or pulmonary embolus. Will discharge home on prednisone taper as well as albuterol inhaler. Follow-up instructions given concerning patient's pulmonary nodule  Final Clinical Impressions(s) / ED Diagnoses   Final diagnoses:  None    New Prescriptions New Prescriptions   No medications on file     Lorre Nick, MD 12/28/16 [id number]

## 2016-12-28 NOTE — ED Notes (Addendum)
Pt reports congestion, cough, and chest tightness x 2 days. Denies fever, chills, or diarrhea. Pt endorses nausea and 3 episodes of vomiting today. Pt ambulatory to room without difficulty.

## 2016-12-28 NOTE — ED Notes (Signed)
ED Provider at bedside. 

## 2017-09-07 ENCOUNTER — Encounter (HOSPITAL_COMMUNITY): Payer: Self-pay

## 2017-09-07 ENCOUNTER — Other Ambulatory Visit: Payer: Self-pay

## 2017-09-07 ENCOUNTER — Inpatient Hospital Stay (HOSPITAL_COMMUNITY)
Admission: EM | Admit: 2017-09-07 | Discharge: 2017-09-12 | DRG: 638 | Disposition: A | Discharge status: Home / Self Care | Payer: BLUE CROSS/BLUE SHIELD | Attending: Internal Medicine | Admitting: Internal Medicine

## 2017-09-07 ENCOUNTER — Emergency Department (HOSPITAL_COMMUNITY): Payer: BLUE CROSS/BLUE SHIELD

## 2017-09-07 DIAGNOSIS — D72829 Elevated white blood cell count, unspecified: Secondary | ICD-10-CM | POA: Diagnosis present

## 2017-09-07 DIAGNOSIS — Z713 Dietary counseling and surveillance: Secondary | ICD-10-CM

## 2017-09-07 DIAGNOSIS — N179 Acute kidney failure, unspecified: Secondary | ICD-10-CM | POA: Diagnosis present

## 2017-09-07 DIAGNOSIS — Z79899 Other long term (current) drug therapy: Secondary | ICD-10-CM

## 2017-09-07 DIAGNOSIS — E860 Dehydration: Secondary | ICD-10-CM | POA: Diagnosis present

## 2017-09-07 DIAGNOSIS — E1110 Type 2 diabetes mellitus with ketoacidosis without coma: Secondary | ICD-10-CM | POA: Diagnosis present

## 2017-09-07 DIAGNOSIS — Z833 Family history of diabetes mellitus: Secondary | ICD-10-CM

## 2017-09-07 DIAGNOSIS — I4581 Long QT syndrome: Secondary | ICD-10-CM | POA: Diagnosis present

## 2017-09-07 DIAGNOSIS — E875 Hyperkalemia: Secondary | ICD-10-CM | POA: Diagnosis present

## 2017-09-07 DIAGNOSIS — I10 Essential (primary) hypertension: Secondary | ICD-10-CM | POA: Diagnosis present

## 2017-09-07 DIAGNOSIS — E1310 Other specified diabetes mellitus with ketoacidosis without coma: Secondary | ICD-10-CM

## 2017-09-07 DIAGNOSIS — R17 Unspecified jaundice: Secondary | ICD-10-CM | POA: Diagnosis not present

## 2017-09-07 DIAGNOSIS — R000 Tachycardia, unspecified: Secondary | ICD-10-CM | POA: Diagnosis present

## 2017-09-07 DIAGNOSIS — E876 Hypokalemia: Secondary | ICD-10-CM | POA: Diagnosis present

## 2017-09-07 DIAGNOSIS — Z6841 Body Mass Index (BMI) 40.0 and over, adult: Secondary | ICD-10-CM

## 2017-09-07 LAB — BLOOD GAS, VENOUS
ACID-BASE DEFICIT: 10.6 mmol/L — AB (ref 0.0–2.0)
BICARBONATE: 15.2 mmol/L — AB (ref 20.0–28.0)
FIO2: 21
O2 SAT: 86.4 %
PATIENT TEMPERATURE: 98.6
PH VEN: 7.266 (ref 7.250–7.430)
pCO2, Ven: 34.6 mmHg — ABNORMAL LOW (ref 44.0–60.0)
pO2, Ven: 59.7 mmHg — ABNORMAL HIGH (ref 32.0–45.0)

## 2017-09-07 LAB — URINALYSIS, ROUTINE W REFLEX MICROSCOPIC
BACTERIA UA: NONE SEEN
Bilirubin Urine: NEGATIVE
Glucose, UA: >=500 mg/dL — AB
Hgb urine dipstick: NEGATIVE
KETONES UR: 80 mg/dL — AB
LEUKOCYTES UA: NEGATIVE
Nitrite: NEGATIVE
Protein, ur: NEGATIVE mg/dL
Specific Gravity, Urine: 1.028 (ref 1.005–1.030)
pH: 5 (ref 5.0–8.0)

## 2017-09-07 LAB — CBG MONITORING, ED
GLUCOSE-CAPILLARY: 495 mg/dL — AB (ref 65–99)
GLUCOSE-CAPILLARY: 578 mg/dL — AB (ref 65–99)
Glucose-Capillary: >600 mg/dL (ref 65–99)

## 2017-09-07 LAB — I-STAT CHEM 8, ED
BUN: 11 mg/dL (ref 6–20)
BUN: 12 mg/dL (ref 6–20)
CREATININE: 0.9 mg/dL (ref 0.61–1.24)
Calcium, Ion: 1.12 mmol/L — ABNORMAL LOW (ref 1.15–1.40)
Calcium, Ion: 1.15 mmol/L (ref 1.15–1.40)
Chloride: 101 mmol/L (ref 101–111)
Chloride: 99 mmol/L — ABNORMAL LOW (ref 101–111)
Creatinine, Ser: 0.9 mg/dL (ref 0.61–1.24)
HCT: 50 % (ref 39.0–52.0)
HEMATOCRIT: 51 % (ref 39.0–52.0)
HEMOGLOBIN: 17.3 g/dL — AB (ref 13.0–17.0)
HEMOGLOBIN: 17 g/dL (ref 13.0–17.0)
Potassium: 6.5 mmol/L (ref 3.5–5.1)
Potassium: 7.6 mmol/L (ref 3.5–5.1)
SODIUM: 129 mmol/L — AB (ref 135–145)
SODIUM: 130 mmol/L — AB (ref 135–145)
TCO2: 17 mmol/L — AB (ref 22–32)
TCO2: 16 mmol/L — AB (ref 22–32)

## 2017-09-07 LAB — CBC WITH DIFFERENTIAL/PLATELET
Basophils Absolute: 0 10*3/uL (ref 0.0–0.1)
Basophils Relative: 0 %
EOS ABS: 0 10*3/uL (ref 0.0–0.7)
EOS PCT: 0 %
HCT: 48.1 % (ref 39.0–52.0)
Hemoglobin: 16.4 g/dL (ref 13.0–17.0)
Lymphocytes Relative: 17 %
Lymphs Abs: 1.7 10*3/uL (ref 0.7–4.0)
MCH: 28.1 pg (ref 26.0–34.0)
MCHC: 34.1 g/dL (ref 30.0–36.0)
MCV: 82.5 fL (ref 78.0–100.0)
MONO ABS: 0.6 10*3/uL (ref 0.1–1.0)
Monocytes Relative: 6 %
NEUTROS PCT: 77 %
Neutro Abs: 7.5 10*3/uL (ref 1.7–7.7)
PLATELETS: 284 10*3/uL (ref 150–400)
RBC: 5.83 MIL/uL — ABNORMAL HIGH (ref 4.22–5.81)
WBC: 9.9 10*3/uL (ref 4.0–10.5)

## 2017-09-07 LAB — COMPREHENSIVE METABOLIC PANEL
ALBUMIN: 4 g/dL (ref 3.5–5.0)
ALT: 36 U/L (ref 17–63)
ANION GAP: 19 — AB (ref 5–15)
AST: 20 U/L (ref 15–41)
Alkaline Phosphatase: 132 U/L — ABNORMAL HIGH (ref 38–126)
BILIRUBIN TOTAL: 1.3 mg/dL — AB (ref 0.3–1.2)
BUN: 9 mg/dL (ref 6–20)
CO2: 14 mmol/L — ABNORMAL LOW (ref 22–32)
Calcium: 9.3 mg/dL (ref 8.9–10.3)
Chloride: 107 mmol/L (ref 101–111)
Creatinine, Ser: 1.18 mg/dL (ref 0.61–1.24)
GFR calc non Af Amer: >60 mL/min (ref >60)
GLUCOSE: 624 mg/dL — AB (ref 65–99)
POTASSIUM: 4.1 mmol/L (ref 3.5–5.1)
SODIUM: 140 mmol/L (ref 135–145)
Total Protein: 9.4 g/dL — ABNORMAL HIGH (ref 6.5–8.1)

## 2017-09-07 LAB — LIPASE, BLOOD: Lipase: 24 U/L (ref 11–51)

## 2017-09-07 LAB — I-STAT TROPONIN, ED: TROPONIN I, POC: 0 ng/mL (ref 0.00–0.08)

## 2017-09-07 LAB — MAGNESIUM: Magnesium: 2.3 mg/dL (ref 1.7–2.4)

## 2017-09-07 MED ORDER — SODIUM CHLORIDE 0.9 % IV BOLUS
1000.0000 mL | Freq: Once | INTRAVENOUS | Status: AC
  Administered 2017-09-07: 1000 mL via INTRAVENOUS

## 2017-09-07 MED ORDER — DEXTROSE-NACL 5-0.45 % IV SOLN
INTRAVENOUS | Status: DC
  Administered 2017-09-08: 07:00:00 via INTRAVENOUS

## 2017-09-07 MED ORDER — DEXTROSE-NACL 5-0.45 % IV SOLN: INTRAVENOUS | Status: DC

## 2017-09-07 MED ORDER — SODIUM CHLORIDE 0.9 % IV SOLN
INTRAVENOUS | Status: DC
  Administered 2017-09-08: 01:00:00 via INTRAVENOUS

## 2017-09-07 MED ORDER — LACTATED RINGERS IV BOLUS
1000.0000 mL | Freq: Once | INTRAVENOUS | Status: AC
  Administered 2017-09-07: 1000 mL via INTRAVENOUS

## 2017-09-07 MED ORDER — SODIUM CHLORIDE 0.9 % IV SOLN
INTRAVENOUS | Status: DC
  Administered 2017-09-07: 5.4 [IU]/h via INTRAVENOUS
  Filled 2017-09-07 (×2): qty 1

## 2017-09-07 MED ORDER — ENOXAPARIN SODIUM 40 MG/0.4ML ~~LOC~~ SOLN
40.0000 mg | SUBCUTANEOUS | Status: DC
  Administered 2017-09-08 – 2017-09-11 (×4): 40 mg via SUBCUTANEOUS
  Filled 2017-09-07 (×4): qty 0.4

## 2017-09-07 MED ORDER — POTASSIUM CHLORIDE 10 MEQ/100ML IV SOLN
10.0000 meq | INTRAVENOUS | Status: AC
  Administered 2017-09-08: 10 meq via INTRAVENOUS
  Filled 2017-09-07: qty 100

## 2017-09-07 NOTE — ED Notes (Signed)
Date and time results received: 09/07/17 2207   Test: glucose  Critical Value: 624   Name of Provider Notified:ER

## 2017-09-07 NOTE — ED Notes (Signed)
Called down to lab and they are still working on the blood work- had to do a couple dilutions.

## 2017-09-07 NOTE — ED Notes (Signed)
I Stat Chem 8: K 6.5 and Glucose >700 Rn and Provider notified

## 2017-09-07 NOTE — ED Notes (Signed)
Blood redrawn and sent to lab

## 2017-09-07 NOTE — ED Notes (Signed)
Repeat I Stat Chem 8: K 7.6 and glucose >700 RN and Provider notified

## 2017-09-07 NOTE — ED Provider Notes (Signed)
[facility]-EMERGENCY DEPT Provider Note   CSN: [id number] Arrival date & time: 09/07/17  1542     History   Chief Complaint Chief Complaint  Patient presents with  . Shortness of Breath  . Emesis  . Dizziness  . Hyperglycemia    HPI Dylan Watts is a 27 y.o. male.  The history is provided by the patient. No language interpreter was used.  Shortness of Breath  Associated symptoms include vomiting.  Emesis    Dizziness  Associated symptoms: shortness of breath and vomiting   Hyperglycemia  Associated symptoms: dizziness, shortness of breath and vomiting    Dylan Watts is a 27 y.o. male who presents to the Emergency Department complaining of sob, vomiting. He reports two days of nausea and vomiting, four episodes of emesis daily. He also has shortness of breath with minimal exertion. He denies any fevers, chest pain, abdominal pain, diarrhea. No dysuria. He has no known medical problems but does not have a family doctor. Symptoms are severe, constant, worsening. History reviewed. No pertinent past medical history.  Patient Active Problem List   Diagnosis Date Noted  . Diabetic ketoacidosis (HCC) 09/07/2017    History reviewed. No pertinent surgical history.      Home Medications    Prior to Admission medications   Medication Sig Start Date End Date Taking? Authorizing Provider  Loperamide HCl (IMODIUM PO) Take 1 tablet by mouth daily as needed (diarrhea).   Yes [provider]    Family History Family History  Problem Relation Age of Onset  . Diabetes Mother   . Diabetes Father     Social History Social History   Tobacco Use  . Smoking status: Never Smoker  . Smokeless tobacco: Never Used  Substance Use Topics  . Alcohol use: Yes    Comment: socially  . Drug use: No     Allergies   Patient has no known allergies.   Review of Systems Review of Systems  Respiratory: Positive for shortness of breath.     Gastrointestinal: Positive for vomiting.  Neurological: Positive for dizziness.  All other systems reviewed and are negative.    Physical Exam Updated Vital Signs BP (!) 159/110 (BP Location: Left Arm)   Pulse (!) 128   Temp 98.2 F (36.8 C) (Oral)   Resp 18   Ht 6\' 1"  (1.854 m)   Wt (!) 142.9 kg (315 lb)   SpO2 99%   BMI 41.56 kg/m   Physical Exam  Constitutional: He is oriented to person, place, and time. He appears well-developed and well-nourished.  HENT:  Head: Normocephalic and atraumatic.  Cardiovascular: Regular rhythm.  No murmur heard. Tachycardic  Pulmonary/Chest: Effort normal and breath sounds normal. No respiratory distress.  Abdominal: Soft. There is no tenderness. There is no rebound and no guarding.  Musculoskeletal: He exhibits no edema or tenderness.  Neurological: He is alert and oriented to person, place, and time.  Skin: Skin is warm and dry.  Psychiatric: He has a normal mood and affect. His behavior is normal.  Nursing note and vitals reviewed.    ED Treatments / Results  Labs (all labs ordered are listed, but only abnormal results are displayed) Labs Reviewed  URINALYSIS, ROUTINE W REFLEX MICROSCOPIC - Abnormal; Notable for the following components:      Result Value   Color, Urine STRAW (*)    Glucose, UA >=500 (*)    Ketones, ur 80 (*)    All other components within normal limits  BLOOD GAS, VENOUS - Abnormal; Notable for the following components:   pCO2, Ven 34.6 (*)    pO2, Ven 59.7 (*)    Bicarbonate 15.2 (*)    Acid-base deficit 10.6 (*)    All other components within normal limits  CBC WITH DIFFERENTIAL/PLATELET - Abnormal; Notable for the following components:   RBC 5.83 (*)    All other components within normal limits  COMPREHENSIVE METABOLIC PANEL - Abnormal; Notable for the following components:   CO2 14 (*)    Glucose, Bld 624 (*)    Total Protein 9.4 (*)    Alkaline Phosphatase 132 (*)    Total Bilirubin 1.3 (*)     Anion gap 19 (*)    All other components within normal limits  CBG MONITORING, ED - Abnormal; Notable for the following components:   Glucose-Capillary >600 (*)    All other components within normal limits  I-STAT CHEM 8, ED - Abnormal; Notable for the following components:   Sodium 129 (*)    Potassium 6.5 (*)    Chloride 99 (*)    Glucose, Bld >700 (*)    TCO2 17 (*)    Hemoglobin 17.3 (*)    All other components within normal limits  I-STAT CHEM 8, ED - Abnormal; Notable for the following components:   Sodium 130 (*)    Potassium 7.6 (*)    Glucose, Bld >700 (*)    Calcium, Ion 1.12 (*)    TCO2 16 (*)    All other components within normal limits  CBG MONITORING, ED - Abnormal; Notable for the following components:   Glucose-Capillary 578 (*)    All other components within normal limits  CBG MONITORING, ED - Abnormal; Notable for the following components:   Glucose-Capillary 495 (*)    All other components within normal limits  LIPASE, BLOOD  MAGNESIUM  CBC WITH DIFFERENTIAL/PLATELET  HIV ANTIBODY (ROUTINE TESTING)  BASIC METABOLIC PANEL  BASIC METABOLIC PANEL  BASIC METABOLIC PANEL  CREATININE, SERUM  CBC  HEMOGLOBIN A1C  I-STAT TROPONIN, ED    EKG EKG Interpretation  Date/Time:  Saturday September 07 2017 16:44:44 EDT Ventricular Rate:  120 PR Interval:    QRS Duration: 82 QT Interval:  366 QTC Calculation: 518 R Axis:   45 Text Interpretation:  Sinus tachycardia Prolonged QT interval Confirmed by Tilden Fossaees, Lanah Steines [phone]([id number]) on 09/07/2017 4:57:52 PM   Radiology Dg Chest 2 View  Result Date: 09/07/2017 CLINICAL DATA:  Shortness of breath, dizziness and vomiting for the past 2 days. EXAM: CHEST - 2 VIEW COMPARISON:  12/27/2016 and chest CTA dated 12/28/2016 FINDINGS: Normal sized heart. Mild central peribronchial thickening. Clear lungs with normal vascularity. No pleural fluid. Normal appearing bones. IMPRESSION: Mild bronchitic changes. Electronically Signed   By:  Beckie SaltsSteven  Reid M.D.   On: 09/07/2017 17:42    Procedures Procedures (including critical care time) CRITICAL CARE Performed by: Tilden FossaElizabeth Roshaun Pound   Total critical care time: 35 minutes  Critical care time was exclusive of separately billable procedures and treating other patients.  Critical care was necessary to treat or prevent imminent or life-threatening deterioration.  Critical care was time spent personally by me on the following activities: development of treatment plan with patient and/or surrogate as well as nursing, discussions with consultants, evaluation of patient's response to treatment, examination of patient, obtaining history from patient or surrogate, ordering and performing treatments and interventions, ordering and review of laboratory studies, ordering and review of radiographic studies, pulse oximetry and re-evaluation  of patient's condition.  Medications Ordered in ED Medications  dextrose 5 %-0.45 % sodium chloride infusion (has no administration in time range)  insulin regular (NOVOLIN R,HUMULIN R) 100 Units in sodium chloride 0.9 % 100 mL (1 Units/mL) infusion (4.4 Units/hr Intravenous Rate/Dose Change 09/07/17 2346)  enoxaparin (LOVENOX) injection 40 mg (has no administration in time range)  0.9 %  sodium chloride infusion (has no administration in time range)  potassium chloride 10 mEq in 100 mL IVPB (has no administration in time range)  sodium chloride 0.9 % bolus 1,000 mL (0 mLs Intravenous Stopped 09/07/17 1821)  sodium chloride 0.9 % bolus 1,000 mL (0 mLs Intravenous Stopped 09/07/17 1902)  lactated ringers bolus 1,000 mL (1,000 mLs Intravenous New Bag/Given 09/07/17 2236)     Initial Impression / Assessment and Plan / ED Course  I have reviewed the triage vital signs and the nursing notes.  Pertinent labs & imaging results that were available during my care of the patient were reviewed by me and considered in my medical decision making (see chart for  details).     Patient here for evaluation of nausea, vomiting, shortness of breath. Blood sugar on ED presentation to call high. He was treated with IV fluids. There was difficulty in resulting CMP from the lab. I stat chem 8 did note significant hyperkalemia but there was question of hemolysis. When final CMP returned from lab there was no hyperkalemia. He was treated with insulin drip for DKA, additional fluids. Patient and family updated of findings of studies and recommendation for admission and they are in agreement with plan. Hospitalist consulted for admission.  Final Clinical Impressions(s) / ED Diagnoses   Final diagnoses:  None    ED Discharge Orders    None       Tilden Fossa, MD 09/08/17 [phone]

## 2017-09-07 NOTE — H&P (Signed)
History and Physical  Dylan Watts HQI:[id number] DOB: [birth year] DOA: 09/07/2017 1553  Referring physician: n/a PCP: Pt, No Pcp Per (Inactive)  Outpatient Specialists: n/a  Chief Complaint:  Nausea and malaise - DKA  HPI: Dylan GageQuinton Watts is a 27 y.o. male with morbid obesity but otherwise no significant PMH presented to ED after 2-3 days of increasing nausea, poor appetitie, and malais and weakness. Reports concurrent increased urinary frequency and thirst. No recent illness or sick contacts. Prior to these sx he had been in his usual state of health. No fevers or cough or other focal resp sx. No new neurological sx.   ED course: HR 126 T 98.2 BP 154/106RR 18 SO2 99% on RA; Blood glucose >700 then 652.+urine ketones and glucose; pH7.26 and anion gap19; received NS boluses x 3L; starting insulin drip whilein ED   Review of Systems:  + nausea, malaise, overall weakness + increased urinary frequency (no dysuria) - no fevers/chills - no cough - no chest pain, dyspnea on exertion - no edema, PND, orthopnea - no tarry, melanotic or bloody stools - no weight changes  Rest of systems reviewed are negative, except as per above history.   History reviewed. No pertinent past medical history. History reviewed. No pertinent surgical history. Social History:  reports that he has never smoked. He has never used smokeless tobacco. He reports that he drinks alcohol. He reports that he does not use drugs.  Allergies: No Known Allergies  Family History  Problem Relation Age of Onset  . Diabetes Mother   . Diabetes Father      Prior to Admission medications   Medication Sig Start Date End Date Taking? Authorizing Provider  Loperamide HCl (IMODIUM PO) Take 1 tablet by mouth daily as needed (diarrhea).   Yes [provider]    Temp:  [98.2 F (36.8 C)] 98.2 F (36.8 C) (06/22 1549) Pulse Rate:  [113-126] 121 (06/22 2000) Resp:  [12-22] 22 (06/22 2000) BP: (127-157)/(83-106)  127/101 (06/22 2000) SpO2:  [97 %-100 %] 97 % (06/22 2000) Weight:  [142.9 kg (315 lb)] 142.9 kg (315 lb) (06/22 1553)  PHYSICAL EXAM:  BP (!) 127/101 (BP Location: Right Arm)   Pulse (!) 121   Temp 98.2 F (36.8 C) (Oral)   Resp (!) 22   Ht 6\' 1"  (1.854 m)   Wt (!) 142.9 kg (315 lb)   SpO2 97%   BMI 41.56 kg/m   GEN morbidly obese young african-american male; sitting comfortably in bed; asking for food  HEENT NCAT EOM PERRL; clear oropharynx, no cervical LAD; moist mucus membranes  JVP estimated 4 cm H2O above RA; no HJR ; no carotid bruits b/l ;  CV regular tachycardic; normal S1 and S2; no m/r/g or S3/S4; PMI not palpable; no parasternal heave  RESP CTA b/l; breathing unlabored and symmetric  ABD soft NT ND obese +normoactive BS  EXT warm throughout b/l; no peripheral edema b/l  PULSES  DP and radials intact b/l  SKIN/MSK no rashes or lesions NEURO/PSYCH no focal deficits   LABS ON ADMISSION:  Basic Metabolic Panel: Recent Labs  Lab 09/07/17 1631 09/07/17 1706 09/07/17 2007  NA 129* 130* 140  K 6.5* 7.6* 4.1  CL 99* 101 107  CO2  --   --  14*  GLUCOSE >700* >700* 624*  BUN 11 12 9   CREATININE 0.90 0.90 1.18  CALCIUM  --   --  9.3   CBC: Recent Labs  Lab 09/07/17 1631 09/07/17  1700 09/07/17 1706  WBC  --  9.9  --   NEUTROABS  --  7.5  --   HGB 17.3* 16.4 17.0  HCT 51.0 48.1 50.0  MCV  --  82.5  --   PLT  --  284  --    VBG Results for Dylan Watts, Dylan Watts (MRN [id number]) as of 09/07/2017 22:25  09/07/2017 16:55  Sample type VEIN  Delivery systems ROOM AIR  FIO2 21.00  pH, Ven 7.266  pCO2, Ven 34.6 (L)  pO2, Ven 59.7 (H)  Acid-base deficit 10.6 (H)  Bicarbonate 15.2 (L)  O2 Saturation 86.4    Liver Function Tests: Recent Labs  Lab 09/07/17 2007  AST 20  ALT 36  ALKPHOS 132*  BILITOT 1.3*  PROT 9.4*  ALBUMIN 4.0   Recent Labs  Lab 09/07/17 2007  LIPASE 24   No results for input(s): AMMONIA in the last 168 hours. Coagulation:  No results  found for: INR, PROTIME No results found for: PTT Lactic Acid, Venous:  No results found for: LATICACIDVEN Cardiac Enzymes: No results for input(s): CKTOTAL, CKMB, CKMBINDEX, TROPONINI in the last 168 hours.  BNP (last 3 results) No results for input(s): PROBNP in the last 8760 hours. CBG: Recent Labs  Lab 09/07/17 1551 09/07/17 2046  GLUCAP >600* 578*    Radiological Exams on Admission: Dg Chest 2 View  Result Date: 09/07/2017 CLINICAL DATA:  Shortness of breath, dizziness and vomiting for the past 2 days. EXAM: CHEST - 2 VIEW COMPARISON:  12/27/2016 and chest CTA dated 12/28/2016 FINDINGS: Normal sized heart. Mild central peribronchial thickening. Clear lungs with normal vascularity. No pleural fluid. Normal appearing bones. IMPRESSION: Mild bronchitic changes. Electronically Signed   By: Beckie Salts M.D.   On: 09/07/2017 17:42    EKG: Independently reviewed. Sinus tachycardia. Note QTc prolongation reported by machine likely due to tachycardia  Assessment/Plan Dylan Watts is a 27 y.o. male with morbid obesity who presents with nausea and malaise found to have blood glucose 600s, pH 7.26, +urine ketones and glucosuria and initial anion gap 19 consistent with DKA. Likely cause was untreated diabetes. This is a new diagnosis for the patient who understandably is taken aback with being told he has diabetes at his age and that he will require insulin going forward. He will need close outpatient follow up -- does not currently have a PCP. In fact, would recommend endocrinology as outpatient given his age and to increase follow up frequency.   Active Problems:   Diabetic ketoacidosis (HCC)   # DKA in setting of untreated diabetes and new diagnosis of diabetes  > initial glucose > 600s and AG 19.  - insulin gtt via glucose stabilizer - continue IVF as per protocol - high goal potassium repletion as per protocol - nutrition and diabetes consult - will need teaching regarding insulin  prior to DC - recommend outpatient endocrinology referral - encourage weight loss and healthy eating habits  # Morbid obesity - nutrition eval and outpatient follow up as above  DVT Prophylaxis: lovenox Code Status:  Full Code Family Communication: mother at bedside  Disposition Plan:  admit to medicine until DKA resolution Extended Emergency Contact Information Primary Emergency Contact: NANCE,DENNIS Address: [address]  [address] Home Phone: [phone] Relation: None Secondary Emergency Contact: Mariel Aloe [place] Mobile Phone: [phone] Relation: Mother  Time spent: > 45 minutes  Ike Bene, MD [facility] Pager [phone]  If 7PM-7AM, please contact night-coverage [email or web address] Password [facility] 09/07/2017, 10:42 PM

## 2017-09-07 NOTE — ED Triage Notes (Addendum)
Patient c/o SOB, dizziness, and vomiting x 2 days. CBG reading "HIGH". Patient states he is not diabetic.

## 2017-09-08 ENCOUNTER — Other Ambulatory Visit: Payer: Self-pay

## 2017-09-08 ENCOUNTER — Encounter (HOSPITAL_COMMUNITY): Payer: Self-pay | Admitting: *Deleted

## 2017-09-08 DIAGNOSIS — E876 Hypokalemia: Secondary | ICD-10-CM | POA: Diagnosis not present

## 2017-09-08 DIAGNOSIS — D72829 Elevated white blood cell count, unspecified: Secondary | ICD-10-CM | POA: Diagnosis not present

## 2017-09-08 DIAGNOSIS — N179 Acute kidney failure, unspecified: Secondary | ICD-10-CM

## 2017-09-08 DIAGNOSIS — E1110 Type 2 diabetes mellitus with ketoacidosis without coma: Secondary | ICD-10-CM | POA: Diagnosis not present

## 2017-09-08 LAB — BASIC METABOLIC PANEL
ANION GAP: 18 — AB (ref 5–15)
ANION GAP: 14 (ref 5–15)
ANION GAP: 11 (ref 5–15)
Anion gap: 10 (ref 5–15)
BUN: 9 mg/dL (ref 6–20)
BUN: 8 mg/dL (ref 6–20)
BUN: 7 mg/dL (ref 6–20)
BUN: 7 mg/dL (ref 6–20)
CALCIUM: 9.2 mg/dL (ref 8.9–10.3)
CALCIUM: 9 mg/dL (ref 8.9–10.3)
CHLORIDE: 110 mmol/L (ref 101–111)
CO2: 15 mmol/L — AB (ref 22–32)
CO2: 18 mmol/L — AB (ref 22–32)
CO2: 22 mmol/L (ref 22–32)
CO2: 22 mmol/L (ref 22–32)
Calcium: 9.2 mg/dL (ref 8.9–10.3)
Calcium: 9.3 mg/dL (ref 8.9–10.3)
Chloride: 110 mmol/L (ref 101–111)
Chloride: 112 mmol/L — ABNORMAL HIGH (ref 101–111)
Chloride: 110 mmol/L (ref 101–111)
Creatinine, Ser: 1.26 mg/dL — ABNORMAL HIGH (ref 0.61–1.24)
Creatinine, Ser: 1 mg/dL (ref 0.61–1.24)
Creatinine, Ser: 0.85 mg/dL (ref 0.61–1.24)
Creatinine, Ser: 0.91 mg/dL (ref 0.61–1.24)
GFR calc Af Amer: >60 mL/min (ref >60)
GFR calc Af Amer: >60 mL/min (ref >60)
GFR calc Af Amer: >60 mL/min (ref >60)
GFR calc non Af Amer: >60 mL/min (ref >60)
GFR calc non Af Amer: >60 mL/min (ref >60)
GLUCOSE: 411 mg/dL — AB (ref 65–99)
GLUCOSE: 150 mg/dL — AB (ref 65–99)
Glucose, Bld: 237 mg/dL — ABNORMAL HIGH (ref 65–99)
Glucose, Bld: 241 mg/dL — ABNORMAL HIGH (ref 65–99)
POTASSIUM: 4.2 mmol/L (ref 3.5–5.1)
POTASSIUM: 3.8 mmol/L (ref 3.5–5.1)
POTASSIUM: 3.4 mmol/L — AB (ref 3.5–5.1)
Potassium: 3.4 mmol/L — ABNORMAL LOW (ref 3.5–5.1)
SODIUM: 142 mmol/L (ref 135–145)
Sodium: 143 mmol/L (ref 135–145)
Sodium: 144 mmol/L (ref 135–145)
Sodium: 143 mmol/L (ref 135–145)

## 2017-09-08 LAB — GLUCOSE, CAPILLARY
GLUCOSE-CAPILLARY: 235 mg/dL — AB (ref 65–99)
GLUCOSE-CAPILLARY: 202 mg/dL — AB (ref 65–99)
GLUCOSE-CAPILLARY: 146 mg/dL — AB (ref 65–99)
GLUCOSE-CAPILLARY: 141 mg/dL — AB (ref 65–99)
GLUCOSE-CAPILLARY: 138 mg/dL — AB (ref 65–99)
GLUCOSE-CAPILLARY: 136 mg/dL — AB (ref 65–99)
GLUCOSE-CAPILLARY: 230 mg/dL — AB (ref 65–99)
GLUCOSE-CAPILLARY: 154 mg/dL — AB (ref 65–99)
GLUCOSE-CAPILLARY: 265 mg/dL — AB (ref 65–99)
Glucose-Capillary: 357 mg/dL — ABNORMAL HIGH (ref 65–99)
Glucose-Capillary: 298 mg/dL — ABNORMAL HIGH (ref 65–99)
Glucose-Capillary: 231 mg/dL — ABNORMAL HIGH (ref 65–99)
Glucose-Capillary: 156 mg/dL — ABNORMAL HIGH (ref 65–99)
Glucose-Capillary: 215 mg/dL — ABNORMAL HIGH (ref 65–99)
Glucose-Capillary: 182 mg/dL — ABNORMAL HIGH (ref 65–99)
Glucose-Capillary: 180 mg/dL — ABNORMAL HIGH (ref 65–99)
Glucose-Capillary: 183 mg/dL — ABNORMAL HIGH (ref 65–99)

## 2017-09-08 LAB — CBC
HEMATOCRIT: 45.8 % (ref 39.0–52.0)
HEMOGLOBIN: 15.7 g/dL (ref 13.0–17.0)
MCH: 29.1 pg (ref 26.0–34.0)
MCHC: 34.3 g/dL (ref 30.0–36.0)
MCV: 84.8 fL (ref 78.0–100.0)
Platelets: 237 10*3/uL (ref 150–400)
RBC: 5.4 MIL/uL (ref 4.22–5.81)
RDW: 13.6 % (ref 11.5–15.5)
WBC: 13.7 10*3/uL — ABNORMAL HIGH (ref 4.0–10.5)

## 2017-09-08 LAB — PHOSPHORUS: Phosphorus: 1.8 mg/dL — ABNORMAL LOW (ref 2.5–4.6)

## 2017-09-08 LAB — RAPID URINE DRUG SCREEN, HOSP PERFORMED
AMPHETAMINES: NOT DETECTED
BENZODIAZEPINES: NOT DETECTED
COCAINE: NOT DETECTED
Opiates: NOT DETECTED
Tetrahydrocannabinol: NOT DETECTED

## 2017-09-08 LAB — MRSA PCR SCREENING: MRSA by PCR: NEGATIVE

## 2017-09-08 LAB — HEMOGLOBIN A1C
HEMOGLOBIN A1C: 13.3 % — AB (ref 4.8–5.6)
MEAN PLASMA GLUCOSE: 335.01 mg/dL

## 2017-09-08 LAB — CBG MONITORING, ED
Glucose-Capillary: 386 mg/dL — ABNORMAL HIGH (ref 65–99)
Glucose-Capillary: 410 mg/dL — ABNORMAL HIGH (ref 65–99)

## 2017-09-08 LAB — MAGNESIUM: Magnesium: 2 mg/dL (ref 1.7–2.4)

## 2017-09-08 MED ORDER — POTASSIUM CHLORIDE CRYS ER 20 MEQ PO TBCR
40.0000 meq | EXTENDED_RELEASE_TABLET | Freq: Two times a day (BID) | ORAL | Status: AC
  Administered 2017-09-08 (×2): 40 meq via ORAL
  Filled 2017-09-08 (×2): qty 2

## 2017-09-08 MED ORDER — INSULIN ASPART 100 UNIT/ML ~~LOC~~ SOLN: 0.0000 [IU] | Freq: Three times a day (TID) | SUBCUTANEOUS | Status: DC

## 2017-09-08 MED ORDER — INSULIN ASPART 100 UNIT/ML ~~LOC~~ SOLN
0.0000 [IU] | Freq: Every day | SUBCUTANEOUS | Status: DC
  Administered 2017-09-08: 3 [IU] via SUBCUTANEOUS

## 2017-09-08 MED ORDER — INSULIN STARTER KIT- PEN NEEDLES (ENGLISH)
1.0000 | Freq: Once | Status: AC
  Administered 2017-09-08: 1
  Filled 2017-09-08: qty 1

## 2017-09-08 MED ORDER — LIVING WELL WITH DIABETES BOOK
Freq: Once | Status: AC
  Administered 2017-09-08: 12:00:00
  Filled 2017-09-08: qty 1

## 2017-09-08 MED ORDER — INSULIN DETEMIR 100 UNIT/ML ~~LOC~~ SOLN
15.0000 [IU] | Freq: Every day | SUBCUTANEOUS | Status: DC
  Administered 2017-09-08: 15 [IU] via SUBCUTANEOUS
  Filled 2017-09-08 (×2): qty 0.15

## 2017-09-08 MED ORDER — K PHOS MONO-SOD PHOS DI & MONO 155-852-130 MG PO TABS
500.0000 mg | ORAL_TABLET | Freq: Two times a day (BID) | ORAL | Status: AC
  Administered 2017-09-08 (×2): 500 mg via ORAL
  Filled 2017-09-08 (×2): qty 2

## 2017-09-08 MED ORDER — INSULIN ASPART 100 UNIT/ML ~~LOC~~ SOLN
0.0000 [IU] | Freq: Three times a day (TID) | SUBCUTANEOUS | Status: DC
  Administered 2017-09-09: 8 [IU] via SUBCUTANEOUS
  Administered 2017-09-09: 15 [IU] via SUBCUTANEOUS

## 2017-09-08 NOTE — ED Notes (Signed)
ED TO INPATIENT HANDOFF REPORT  Name/Age/Gender Dylan Watts 27 y.o. male  Code Status    Code Status Orders  (From admission, onward)        Start     Ordered   09/07/17 2239  Full code  Continuous     09/07/17 2241    Code Status History    This patient has a current code status but no historical code status.      Home/SNF/Other Home  Chief Complaint Shortness of Breath/ Fatigue / Vomitting / Over 2 days ago   Level of Care/Admitting Diagnosis ED Disposition    ED Disposition Condition Comment   Admit  Hospital Area: [facility] [facility] [[id number]]  Level of Care: Med-Surg [16]  Diagnosis: Diabetic ketoacidosis [facility]) [650354]  Admitting Physician: Colbert Ewing [[id number]]  Attending Physician: Colbert Ewing [[id number]]  PT Class (Do Not Modify): Observation [104]  PT Acc Code (Do Not Modify): Observation [10022]       Medical History History reviewed. No pertinent past medical history.  Allergies No Known Allergies  IV Location/Drains/Wounds Patient Lines/Drains/Airways Status   Active Line/Drains/Airways    Name:   Placement date:   Placement time:   Site:   Days:   Peripheral IV 09/07/17 Left Hand   09/07/17    1702    Hand   1   Peripheral IV 09/07/17 Right Forearm   09/07/17    2231    Forearm   1          Labs/Imaging Results for orders placed or performed during the hospital encounter of 09/07/17 (from the past 48 hour(s))  CBG monitoring, ED     Status: Abnormal   Collection Time: 09/07/17  3:51 PM  Result Value Ref Range   Glucose-Capillary >600 (HH) 65 - 99 mg/dL  Urinalysis, Routine w reflex microscopic     Status: Abnormal   Collection Time: 09/07/17  4:05 PM  Result Value Ref Range   Color, Urine STRAW (A) YELLOW   APPearance CLEAR CLEAR   Specific Gravity, Urine 1.028 1.005 - 1.030   pH 5.0 5.0 - 8.0   Glucose, UA >=500 (A) NEGATIVE mg/dL   Hgb urine dipstick NEGATIVE NEGATIVE   Bilirubin Urine NEGATIVE NEGATIVE   Ketones, ur 80 (A) NEGATIVE mg/dL   Protein, ur NEGATIVE NEGATIVE mg/dL   Nitrite NEGATIVE NEGATIVE   Leukocytes, UA NEGATIVE NEGATIVE   RBC / HPF 0-5 0 - 5 RBC/hpf   WBC, UA 0-5 0 - 5 WBC/hpf   Bacteria, UA NONE SEEN NONE SEEN   Mucus PRESENT     Comment: Performed at [facility], [place] [address], [place], [place] [address]  I-stat troponin, ED     Status: None   Collection Time: 09/07/17  4:25 PM  Result Value Ref Range   Troponin i, poc 0.00 0.00 - 0.08 ng/mL   Comment 3            Comment: Due to the release kinetics of cTnI, a negative result within the first hours of the onset of symptoms does not rule out myocardial infarction with certainty. If myocardial infarction is still suspected, repeat the test at appropriate intervals.   I-stat Chem 8, ED     Status: Abnormal   Collection Time: 09/07/17  4:31 PM  Result Value Ref Range   Sodium 129 (L) 135 - 145 mmol/L   Potassium 6.5 (HH) 3.5 - 5.1 mmol/L   Chloride 99 (L) 101 -  111 mmol/L   BUN 11 6 - 20 mg/dL   Creatinine, Ser 0.90 0.61 - 1.24 mg/dL   Glucose, Bld >700 (HH) 65 - 99 mg/dL   Calcium, Ion 1.15 1.15 - 1.40 mmol/L   TCO2 17 (L) 22 - 32 mmol/L   Hemoglobin 17.3 (H) 13.0 - 17.0 g/dL   HCT 51.0 39.0 - 52.0 %   Comment NOTIFIED PHYSICIAN   Blood gas, venous     Status: Abnormal   Collection Time: 09/07/17  4:55 PM  Result Value Ref Range   FIO2 21.00    Delivery systems ROOM AIR    pH, Ven 7.266 7.250 - 7.430   pCO2, Ven 34.6 (L) 44.0 - 60.0 mmHg   pO2, Ven 59.7 (H) 32.0 - 45.0 mmHg   Bicarbonate 15.2 (L) 20.0 - 28.0 mmol/L   Acid-base deficit 10.6 (H) 0.0 - 2.0 mmol/L   O2 Saturation 86.4 %   Patient temperature 98.6    Collection site VEIN    Drawn by RN    Sample type VEIN     Comment: Performed at [facility], [place] [address] [place], [place] [address]  CBC with Differential/Platelet     Status: Abnormal   Collection Time: 09/07/17  5:00 PM  Result Value Ref  Range   WBC 9.9 4.0 - 10.5 K/uL   RBC 5.83 (H) 4.22 - 5.81 MIL/uL   Hemoglobin 16.4 13.0 - 17.0 g/dL   HCT 48.1 39.0 - 52.0 %   MCV 82.5 78.0 - 100.0 fL   MCH 28.1 26.0 - 34.0 pg   MCHC 34.1 30.0 - 36.0 g/dL   Platelets 284 150 - 400 K/uL   Neutrophils Relative % 77 %   Neutro Abs 7.5 1.7 - 7.7 K/uL   Lymphocytes Relative 17 %   Lymphs Abs 1.7 0.7 - 4.0 K/uL   Monocytes Relative 6 %   Monocytes Absolute 0.6 0.1 - 1.0 K/uL   Eosinophils Relative 0 %   Eosinophils Absolute 0.0 0.0 - 0.7 K/uL   Basophils Relative 0 %   Basophils Absolute 0.0 0.0 - 0.1 K/uL    Comment: Performed at [facility], [place] [address], [place], [place] [address]  I-stat chem 8, ed     Status: Abnormal   Collection Time: 09/07/17  5:06 PM  Result Value Ref Range   Sodium 130 (L) 135 - 145 mmol/L   Potassium 7.6 (HH) 3.5 - 5.1 mmol/L   Chloride 101 101 - 111 mmol/L   BUN 12 6 - 20 mg/dL   Creatinine, Ser 0.90 0.61 - 1.24 mg/dL   Glucose, Bld >700 (HH) 65 - 99 mg/dL   Calcium, Ion 1.12 (L) 1.15 - 1.40 mmol/L   TCO2 16 (L) 22 - 32 mmol/L   Hemoglobin 17.0 13.0 - 17.0 g/dL   HCT 50.0 39.0 - 52.0 %   Comment NOTIFIED PHYSICIAN   Comprehensive metabolic panel     Status: Abnormal   Collection Time: 09/07/17  8:07 PM  Result Value Ref Range   Sodium 140 135 - 145 mmol/L    Comment: DELTA CHECK NOTED   Potassium 4.1 3.5 - 5.1 mmol/L    Comment: DELTA CHECK NOTED   Chloride 107 101 - 111 mmol/L   CO2 14 (L) 22 - 32 mmol/L   Glucose, Bld 624 (HH) 65 - 99 mg/dL    Comment: CRITICAL RESULT CALLED TO, READ BACK BY AND VERIFIED WITH: GARIFO,A RN [id number] [place]  BUN 9 6 - 20 mg/dL   Creatinine, Ser 1.18 0.61 - 1.24 mg/dL   Calcium 9.3 8.9 - 10.3 mg/dL   Total Protein 9.4 (H) 6.5 - 8.1 g/dL   Albumin 4.0 3.5 - 5.0 g/dL   AST 20 15 - 41 U/L   ALT 36 17 - 63 U/L   Alkaline Phosphatase 132 (H) 38 - 126 U/L   Total Bilirubin 1.3 (H) 0.3 - 1.2 mg/dL   GFR calc non Af Amer >60 >60  mL/min   GFR calc Af Amer >60 >60 mL/min    Comment: (NOTE) The eGFR has been calculated using the CKD EPI equation. This calculation has not been validated in all clinical situations. eGFR's persistently <60 mL/min signify possible Chronic Kidney Disease.    Anion gap 19 (H) 5 - 15    Comment: Performed at [facility], [place] [address], [place], [place] [address]  Lipase, blood     Status: None   Collection Time: 09/07/17  8:07 PM  Result Value Ref Range   Lipase 24 11 - 51 U/L    Comment: Performed at [facility], [place] [address] [place], [place] [address]  Magnesium     Status: None   Collection Time: 09/07/17  8:07 PM  Result Value Ref Range   Magnesium 2.3 1.7 - 2.4 mg/dL    Comment: Performed at [facility], [place] [address] [place], [place] [address]  CBG monitoring, ED     Status: Abnormal   Collection Time: 09/07/17  8:46 PM  Result Value Ref Range   Glucose-Capillary 578 (HH) 65 - 99 mg/dL   Comment 1 Notify RN   CBG monitoring, ED     Status: Abnormal   Collection Time: 09/07/17 11:40 PM  Result Value Ref Range   Glucose-Capillary 495 (H) 65 - 99 mg/dL   Dg Chest 2 View  Result Date: 09/07/2017 CLINICAL DATA:  Shortness of breath, dizziness and vomiting for the past 2 days. EXAM: CHEST - 2 VIEW COMPARISON:  12/27/2016 and chest CTA dated 12/28/2016 FINDINGS: Normal sized heart. Mild central peribronchial thickening. Clear lungs with normal vascularity. No pleural fluid. Normal appearing bones. IMPRESSION: Mild bronchitic changes. Electronically Signed   By: Claudie Revering M.D.   On: 09/07/2017 17:42    Pending Labs Unresulted Labs (From admission, onward)   Start     Ordered   09/14/17 0500  Creatinine, serum  (enoxaparin (LOVENOX)    CrCl >/= 30 ml/min)  Weekly,   R    Comments:  while on enoxaparin therapy    09/07/17 2241   09/08/17 0500  CBC  (enoxaparin (LOVENOX)    CrCl >/= 30 ml/min)  Tomorrow morning,    R    Comments:  Baseline for enoxaparin therapy IF NOT ALREADY DRAWN.  Notify MD if PLT < 100 K.    09/07/17 2243   09/08/17 0500  Hemoglobin A1c  Tomorrow morning,   R     09/07/17 2301   09/08/17 6283  Basic metabolic panel  STAT Now then every 4 hours ,   STAT     09/07/17 2241   09/07/17 2239  HIV antibody (Routine Testing)  Once,   R     09/07/17 2241   09/07/17 2239  Creatinine, serum  (enoxaparin (LOVENOX)    CrCl >/= 30 ml/min)  Once,   R    Comments:  Baseline for enoxaparin therapy IF NOT ALREADY DRAWN.  09/07/17 2241   09/07/17 1605  CBC with Differential  Once,   STAT     09/07/17 1605      Vitals/Pain Today's Vitals   09/07/17 1808 09/07/17 1830 09/07/17 2000 09/07/17 2338  BP: (!) 138/102 133/83 (!) 127/101 (!) 159/110  Pulse: (!) 115 (!) 113 (!) 121 (!) 128  Resp: 16 12 (!) 22 18  Temp:      TempSrc:      SpO2: 99% 100% 97% 99%  Weight:      Height:      PainSc:        Isolation Precautions No active isolations  Medications Medications  dextrose 5 %-0.45 % sodium chloride infusion (has no administration in time range)  insulin regular (NOVOLIN R,HUMULIN R) 100 Units in sodium chloride 0.9 % 100 mL (1 Units/mL) infusion (4.4 Units/hr Intravenous Rate/Dose Change 09/07/17 2346)  enoxaparin (LOVENOX) injection 40 mg (has no administration in time range)  0.9 %  sodium chloride infusion (has no administration in time range)  potassium chloride 10 mEq in 100 mL IVPB (has no administration in time range)  sodium chloride 0.9 % bolus 1,000 mL (0 mLs Intravenous Stopped 09/07/17 1821)  sodium chloride 0.9 % bolus 1,000 mL (0 mLs Intravenous Stopped 09/07/17 1902)  lactated ringers bolus 1,000 mL (1,000 mLs Intravenous New Bag/Given 09/07/17 2236)    Mobility walks

## 2017-09-08 NOTE — Care Management Note (Addendum)
Case Management Note  Patient Details  Name: Dylan GageQuinton Watts MRN: [id number] Date of Birth: 1990-09-06  Subjective/Objective:   New DM                 Action/Plan: NCM spoke to pt and mother at bedside. Pt states he has insurance with his job. His BCBS will cover meds if not new brand name insulins that may have a high copay and which usually require prior auth. Unable to check benefits for meds on weekend.  Reviewed insurance and BCBS is not active. Pt states it may have lapsed. Explained to pt that NCM will arrange [facility] or other [facility] on 09/09/2017 and [facility]. Pt states he works part-time and able to afford $3 copay.  Levemir pens are acceptable to with MATCH. Updated attending.    Expected Discharge Date:  09/10/17               Expected Discharge Plan:  Home/Self Care  In-House Referral:  NA  Discharge planning Services  CM Consult  Post Acute Care Choice:  NA Choice offered to:  NA  DME Arranged:  N/A DME Agency:  NA  HH Arranged:  NA HH Agency:  NA  Status of Service:  Completed, signed off  If discussed at Long Length of Stay Meetings, dates discussed:    Additional Comments:  Elliot CousinShavis, Lauree Yurick Ellen, RN 09/08/2017, 10:12 AM

## 2017-09-08 NOTE — Progress Notes (Signed)
Reviewed with patient how to give himself a shot of insulin.  Patient was able to give himself the 15 units of Levamir insulin in his R thigh.  Patient was able to give himself the insulin shot sub q without any problem.  Continue to teach patient how to give himself the shots of insulin and review diabetes information with the patient.  Yael Angerer Debroah LoopArnold RN

## 2017-09-08 NOTE — Progress Notes (Addendum)
Inpatient Diabetes Program Recommendations  AACE/ADA: New Consensus Statement on Inpatient Glycemic Control (2019)  Target Ranges:  Prepandial:   less than 140 mg/dL      Peak postprandial:   less than 180 mg/dL (1-2 hours)      Critically ill patients:  140 - 180 mg/dL  Results for FEDERICO, MAIORINO (MRN [id number]) as of 09/08/2017 07:49  Ref. Range 09/08/2017 00:42 09/08/2017 01:32 09/08/2017 02:53 09/08/2017 03:52 09/08/2017 05:18 09/08/2017 06:28 09/08/2017 07:22  Glucose-Capillary Latest Ref Range: 65 - 99 mg/dL 410 (H) 386 (H) 357 (H) 298 (H) 231 (H) 235 (H) 202 (H)   Results for TEREZ, FREIMARK (MRN [id number]) as of 09/08/2017 07:49  Ref. Range 09/07/2017 16:31 09/07/2017 17:06 09/07/2017 20:07  Glucose Latest Ref Range: 65 - 99 mg/dL >700 (HH) >700 (HH) 624 (HH)   Review of Glycemic Control  Diabetes history: No Outpatient Diabetes medications: NA Current orders for Inpatient glycemic control: IV insulin drip  Inpatient Diabetes Program Recommendations:  Insulin - IV drip/GlucoStabilizer: Noted initial glucose >700 mg/dl, patient was acidotic per labs, and is currently on an insulin drip. Noted glucose now down to 202 mg/dl but insulin drip rates are still very high (10.5 and 9.9 units/hr over the last 2 hours). MD may want to consider keeping patient on IV insulin drip for 8-12 hours after acidosis is cleared to help determine amount of SQ insulin patient is going to require. A1C: A1C in process.  NOTE: Noted consult for Diabetes Coordinator. Diabetes Coordinator is not on campus over the weekend but available for questions by pager from 8am to 5pm. Patient has no documented history of DM but does have a family history of DM (Mother and Father both have DM per H&P). Patient was acidotic when he presented to the hospital and glucose was >700 mg/dl so patient was started on IV insulin drip. Glucose is trending down and currently down to 202 mg/dl and acidosis is improving (CO2 18 and AG 8 @ 5:46  today). IV insulin drip rates are very high (10.5 and 9.9 units/hour for the last 2 hours). MD may want to consider continuing IV insulin even after acidosis is cleared and glucose is in the target ranges of 140-180 mg/dl to help determine how much insulin the patient will require. Ordered: Living Well with DM book, RD consult for diet education, CM consult (needs PCP),Insulin starter kit, DM videos, and patient education by bedside RNs. Diabetes Coordinator will plan to follow up with patient on Monday (09/09/17).  Addendum 09/08/17 @ 12:15- Spoke with patient over phone about new diabetes diagnosis. Patient reports that he has a family history of DM on both sides and he states that his mother takes insulin.  Discussed A1C results (13.3% on 09/08/17) and explained what an A1C is and informed patient that his current A1C indicates an average glucose of 335 mg/dl over the past 2-3 months. Discussed basic pathophysiology of DM Type 2, basic home care, importance of checking CBGs and maintaining good CBG control to prevent long-term and short-term complications. Reviewed glucose and A1C goals and explained that glucose may not be in target prior to discharge as he will need to get back to his normal daily activity and check glucose 4 times per day so that insulin can be adjusted if needed.  Reviewed signs and symptoms of hyperglycemia and hypoglycemia along with treatment for both. Discussed impact of nutrition, exercise, stress, sickness, and medications on diabetes control. Reviewed Living Well with diabetes booklet and encouraged patient to  read through entire book. Patient states that he has [employer] but he does not have PCP.  Patient states that [facility] usually covers okay on medications. Patient reports that he has been told that he will be ordered Levemir. Discussed Levemir and Novolog insulin in more detail. Informed patient that once acidosis is cleared it has been recommended he stay on the insulin drip for  a few more hours to help determine insulin needs but explained it would be up to the doctor as to how long he is continued on insulin drip.   Asked patient to check his glucose 4 times per day (before meals and at bedtime) and to keep a log book of glucose readings and insulin taken. Explained how the doctor he follows up with can use the log book to continue to make insulin adjustments if needed. Informed patient that RN will be asking him to self-administer insulin to ensure proper technique and ability to administer self insulin shots.   Patient verbalized understanding of information discussed and he states that he has no further questions at this time related to diabetes.   RNs to provide ongoing basic DM education at bedside with this patient and engage patient to actively check blood glucose and administer insulin injections. Called RN to update on conversation with patient. Will plan to have Diabetes Coordinator see patient tomorrow (09/09/17).  Thanks, Barnie Alderman, RN, MSN, CDE Diabetes Coordinator Inpatient Diabetes Program [phone] (Team Pager from 8am to 5pm)

## 2017-09-08 NOTE — Progress Notes (Signed)
PROGRESS NOTE    [name]  ZOX:[id number] DOB: 10-Feb-1991 DOA: 09/07/2017 PCP: Pt, No Pcp Per (Inactive)   Brief Narrative:  Dylan Watts is a 27 y.o. male with morbid obesity but otherwise no significant PMH who presented to ED after 2-3 days of increasing nausea, poor appetitie, and malais and weakness. Reports concurrent increased urinary frequency and thirst. No recent illness or sick contacts. Prior to these sx he had been in his usual state of health. No fevers or cough or other focal resp sx. No new neurological sx. In the ED he was found to have a Blood glucose >700 then 652.+Urine ketones and glucose; pH7.26 and anion gap19; received NS boluses x 3L; He was insulin drip and admitted to the stepdown unit for Diabetic Ketoacidosis and is improving.  Assessment & Plan:   Active Problems:   Diabetic ketoacidosis (HCC)   Obesity, Class III, BMI 40-49.9 (morbid obesity) (HCC)   Leukocytosis   AKI (acute kidney injury) (HCC)   Hypokalemia   Hypophosphatemia  DKA in setting of untreated diabetes and new diagnosis of Diabetes Mellitus -On Admission initial Glucose > 700s and AG 19.  -C/w insulin gtt via glucose stabilizer for now and transition appropriately  -Continued IVF as per protocol with NS at a rate of 150 mL/hr and change to D5W 1/2 NS when Blood Sugar is <250 -Continue to Monitor BMP's q4h and transition to Long Acting Insulin when AG is closed and CO2 >20 x2; Transition with Insulin gtt remaining on for 1-2 Hours after Long Acting Insulin Administration  -Previous AG was 14 and CO2 was 18, Repeat AG now 11, and CO2 ws 22 -Will repeat BMP this Afternoon at 1400 and if Gap is closed and CO2 >20 then will advance his diet to Regular Diet and then Transition Insulin gtt off to Levemir  -Consulted Nutrition and Diabetes consult; diet advanced on a clear liquid diet if patient tolerates this we will advance to Carb Modified Diet when ready to transition -HbA1c was 13.3 -Will need  teaching regarding insulin prior to DC -Recommending outpatient Endocrinology referral -Encourage weight loss and healthy eating habits  Morbid Obesity -BMI was 41.56 kg/m2  -Weight Loss Counseling given nutrition eval and outpatient follow up as above  Pseudohyponatremia -Improved  -Patient's Na+ on Admission was 130 and low in the setting of Hyperglycemia as Blood Sugar was >700 -C/w D5W 1/2NS as above  Leukocytosis -Patient's WBC went from 9.9 -> 13.7 -Likely Reactive -Continue to Monitor for S/Sx of Infection -Repeat CBC in AM   Hyperkalemia -> Hypokalema -In the setting of DKA -Improved and is now Hypokalemia -Potassium was 7.0 on admission is now 3.4 -Replete with p.o. potassium chloride 40 mg x 2 doses as well as p.o. K-Phos Neutral 500 mg p.o. twice daily x2 doses -Continue to monitor and replete as necessary -Repeat C MP in a.m.  AKI  -Mild. In the setting of Dehydration from DKA -BUN/Cr now improved to 7/0.85 and was 9/1.26 on admission -Continue IVF as above and stop when patient is fully eating -Repeat CMP in AM   Hypophosphatemia -Patient's phosphorus level this morning was 1.8 -Replete with p.o. K-Phos Neutral 500 mill grams p.o. twice daily x2 doses -Continue to monitor and replete as necessary -Repeat phosphorus level in a.m.  DVT prophylaxis: Enoxaparin 40 mg sq q24h Code Status: FULL CODE Family Communication: Discussed with family present at bedside Disposition Plan: Anticipate D/C Home in next 24-48 hours if medically stable and improved.  Consultants:  None   Procedures: None   Antimicrobials:  Anti-infectives (From admission, onward)   None     Subjective: Seen and examined at bedside and was doing better and states he felt better than when he came in.  No chest pain, shortness breath, nausea, vomiting. States that his family has diabetes.  No other complaints or concerns at this time  Objective: Vitals:   09/08/17 0759 09/08/17  0800 09/08/17 1000 09/08/17 1137  BP:  (!) 138/93 (!) 142/98   Pulse:  (!) 107 (!) 107   Resp:  (!) 22 (!) 23   Temp: 97.6 F (36.4 C)   98.6 F (37 C)  TempSrc: Oral   Oral  SpO2:  100% 98%   Weight:      Height:        Intake/Output Summary (Last 24 hours) at 09/08/2017 1146 Last data filed at 09/08/2017 1026 Gross per 24 hour  Intake 2678.49 ml  Output 500 ml  Net 2178.49 ml   Filed Weights   09/07/17 1553  Weight: (!) 142.9 kg (315 lb)   Examination: Physical Exam:  Constitutional: WN/WD morbidly obese AAM in NAD and appears calm and comfortable Eyes: Lids and conjunctivae normal, sclerae anicteric  ENMT: External Ears, Nose appear normal. Grossly normal hearing. Mucous membranes are moist.  Neck: Appears normal, supple, no cervical masses, normal ROM, no appreciable thyromegaly, no JVD Respiratory: Diminished to auscultation bilaterally, no wheezing, rales, rhonchi or crackles. Normal respiratory effort and patient is not tachypenic. No accessory muscle use.  Cardiovascular: RRR, no murmurs / rubs / gallops. S1 and S2 auscultated. No extremity edema. .  Abdomen: Soft, non-tender, Distended 2/2 to body habitus. No masses palpated. No appreciable hepatosplenomegaly. Bowel sounds positive x4.  GU: Deferred. Musculoskeletal: No clubbing / cyanosis of digits/nails. No joint deformity upper and lower extremities.  Skin: No rashes, lesions, ulcers on a limited skin evaluation. No induration; Warm and dry.  Neurologic: CN 2-12 grossly intact with no focal deficits.  Romberg sign and cerebellar reflexes not assessed.  Psychiatric: Normal judgment and insight. Alert and oriented x 3. Normal mood and appropriate affect.   Data Reviewed: I have personally reviewed following labs and imaging studies  CBC: Recent Labs  Lab 09/07/17 1631 09/07/17 1700 09/07/17 1706 09/08/17 0546  WBC  --  9.9  --  13.7*  NEUTROABS  --  7.5  --   --   HGB 17.3* 16.4 17.0 15.7  HCT 51.0 48.1  50.0 45.8  MCV  --  82.5  --  84.8  PLT  --  284  --  237   Basic Metabolic Panel: Recent Labs  Lab 09/07/17 1706 09/07/17 2007 09/08/17 0219 09/08/17 0546 09/08/17 0748 09/08/17 0952  NA 130* 140 143 144  --  143  K 7.6* 4.1 4.2 3.8  --  3.4*  CL 101 107 110 112*  --  110  CO2  --  14* 15* 18*  --  22  GLUCOSE >700* 624* 411* 237*  --  150*  BUN 12 9 9 8   --  7  CREATININE 0.90 1.18 1.26* 1.00  --  0.85  CALCIUM  --  9.3 9.2 9.2  --  9.3  MG  --  2.3  --   --  2.0  --   PHOS  --   --   --   --  1.8*  --    GFR: Estimated Creatinine Clearance: 192.3 mL/min (by C-G formula based on SCr  of 0.85 mg/dL). Liver Function Tests: Recent Labs  Lab 09/07/17 2007  AST 20  ALT 36  ALKPHOS 132*  BILITOT 1.3*  PROT 9.4*  ALBUMIN 4.0   Recent Labs  Lab 09/07/17 2007  LIPASE 24   No results for input(s): AMMONIA in the last 168 hours. Coagulation Profile: No results for input(s): INR, PROTIME in the last 168 hours. Cardiac Enzymes: No results for input(s): CKTOTAL, CKMB, CKMBINDEX, TROPONINI in the last 168 hours. BNP (last 3 results) No results for input(s): PROBNP in the last 8760 hours. HbA1C: Recent Labs    09/08/17 0546  HGBA1C 13.3*   CBG: Recent Labs  Lab 09/08/17 0722 09/08/17 0827 09/08/17 0921 09/08/17 1023 09/08/17 1121  GLUCAP 202* 156* 146* 141* 138*   Lipid Profile: No results for input(s): CHOL, HDL, LDLCALC, TRIG, CHOLHDL, LDLDIRECT in the last 72 hours. Thyroid Function Tests: No results for input(s): TSH, T4TOTAL, FREET4, T3FREE, THYROIDAB in the last 72 hours. Anemia Panel: No results for input(s): VITAMINB12, FOLATE, FERRITIN, TIBC, IRON, RETICCTPCT in the last 72 hours. Sepsis Labs: No results for input(s): PROCALCITON, LATICACIDVEN in the last 168 hours.  Recent Results (from the past 240 hour(s))  MRSA PCR Screening     Status: None   Collection Time: 09/08/17  2:19 AM  Result Value Ref Range Status   MRSA by PCR NEGATIVE NEGATIVE  Final    Comment:        The GeneXpert MRSA Assay (FDA approved for NASAL specimens only), is one component of a comprehensive MRSA colonization surveillance program. It is not intended to diagnose MRSA infection nor to guide or monitor treatment for MRSA infections. Performed at [facility], [address], [place], [place] [address]      Radiology Studies: Dg Chest 2 View  Result Date: 09/07/2017 CLINICAL DATA:  Shortness of breath, dizziness and vomiting for the past 2 days. EXAM: CHEST - 2 VIEW COMPARISON:  12/27/2016 and chest CTA dated 12/28/2016 FINDINGS: Normal sized heart. Mild central peribronchial thickening. Clear lungs with normal vascularity. No pleural fluid. Normal appearing bones. IMPRESSION: Mild bronchitic changes. Electronically Signed   By: Beckie Salts M.D.   On: 09/07/2017 17:42   Scheduled Meds: . enoxaparin (LOVENOX) injection  40 mg Subcutaneous Q24H  . phosphorus  500 mg Oral BID  . potassium chloride  40 mEq Oral BID   Continuous Infusions: . dextrose 5 % and 0.45% NaCl 75 mL/hr at 09/08/17 6045  . insulin (NOVOLIN-R) infusion 5.7 Units/hr (09/08/17 1026)    LOS: 0 days   Merlene Laughter, DO [facility] Pager [phone]  If 7PM-7AM, please contact night-coverage [email or web address] Password [facility] 09/08/2017, 11:46 AM

## 2017-09-08 NOTE — Plan of Care (Signed)
Nutrition Education Note  RD consulted for nutrition education regarding diabetes.  Spoke with pt at bedside. Discussed pt's dietary recall. Pt reports typically eating 2-3 meals daily. Breakfast might be skipped or might include eggs with 2 glasses of juice. Lunch typically includes a burger, fries, and a soda. Dinner is home-cooked and includes fish, Brussels sprouts, and mashed potatoes. Pt drinks juice, water, and soda throughout the day.  Lab Results  Component Value Date   HGBA1C 13.3 (H) 09/08/2017    RD provided "Carbohydrate Counting for People with Diabetes" handout from the Academy of Nutrition and Dietetics. Discussed different food groups and their effects on blood sugar, emphasizing carbohydrate-containing foods. Provided list of carbohydrates and recommended serving sizes of common foods.  Discussed importance of controlled and consistent carbohydrate intake throughout the day. Provided examples of ways to balance meals/snacks and encouraged intake of high-fiber, whole grain complex carbohydrates. Teach back method used.  Expect good compliance.  Body mass index is 41.56 kg/m. Pt meets criteria for Obesity Class III based on current BMI.  Current diet order is Clear Liquid Diet. No meal completion logged in pt's chart at this time. Labs and medications reviewed. No further nutrition interventions warranted at this time. All questions answered. If additional nutrition issues arise, please re-consult RD.  Dylan ReadingKate Watts Dylan Meloni, MS, RD, LDN Pager: [phone] Weekend/After Hours: [phone]

## 2017-09-09 DIAGNOSIS — E1110 Type 2 diabetes mellitus with ketoacidosis without coma: Secondary | ICD-10-CM | POA: Diagnosis not present

## 2017-09-09 DIAGNOSIS — E876 Hypokalemia: Secondary | ICD-10-CM | POA: Diagnosis not present

## 2017-09-09 DIAGNOSIS — N179 Acute kidney failure, unspecified: Secondary | ICD-10-CM | POA: Diagnosis not present

## 2017-09-09 LAB — BASIC METABOLIC PANEL
ANION GAP: 16 — AB (ref 5–15)
ANION GAP: 14 (ref 5–15)
ANION GAP: 12 (ref 5–15)
BUN: 8 mg/dL (ref 6–20)
BUN: 9 mg/dL (ref 6–20)
BUN: 9 mg/dL (ref 6–20)
CALCIUM: 8.8 mg/dL — AB (ref 8.9–10.3)
CALCIUM: 8.9 mg/dL (ref 8.9–10.3)
CO2: 17 mmol/L — ABNORMAL LOW (ref 22–32)
CO2: 16 mmol/L — AB (ref 22–32)
CO2: 20 mmol/L — AB (ref 22–32)
CREATININE: 0.97 mg/dL (ref 0.61–1.24)
CREATININE: 0.87 mg/dL (ref 0.61–1.24)
Calcium: 8.8 mg/dL — ABNORMAL LOW (ref 8.9–10.3)
Chloride: 106 mmol/L (ref 101–111)
Chloride: 110 mmol/L (ref 101–111)
Chloride: 111 mmol/L (ref 101–111)
Creatinine, Ser: 0.83 mg/dL (ref 0.61–1.24)
GFR calc Af Amer: >60 mL/min (ref >60)
GFR calc non Af Amer: >60 mL/min (ref >60)
GFR calc non Af Amer: >60 mL/min (ref >60)
GLUCOSE: 419 mg/dL — AB (ref 65–99)
GLUCOSE: 336 mg/dL — AB (ref 65–99)
GLUCOSE: 309 mg/dL — AB (ref 65–99)
Potassium: 3.8 mmol/L (ref 3.5–5.1)
Potassium: 4 mmol/L (ref 3.5–5.1)
Potassium: 3.9 mmol/L (ref 3.5–5.1)
Sodium: 139 mmol/L (ref 135–145)
Sodium: 140 mmol/L (ref 135–145)
Sodium: 143 mmol/L (ref 135–145)

## 2017-09-09 LAB — GLUCOSE, CAPILLARY
GLUCOSE-CAPILLARY: 272 mg/dL — AB (ref 65–99)
Glucose-Capillary: 295 mg/dL — ABNORMAL HIGH (ref 65–99)
Glucose-Capillary: 365 mg/dL — ABNORMAL HIGH (ref 65–99)
Glucose-Capillary: 265 mg/dL — ABNORMAL HIGH (ref 65–99)

## 2017-09-09 LAB — CBC WITH DIFFERENTIAL/PLATELET
BASOS ABS: 0 10*3/uL (ref 0.0–0.1)
BASOS PCT: 0 %
EOS ABS: 0.1 10*3/uL (ref 0.0–0.7)
EOS PCT: 1 %
HEMATOCRIT: 40.5 % (ref 39.0–52.0)
Hemoglobin: 13.6 g/dL (ref 13.0–17.0)
Lymphocytes Relative: 31 %
Lymphs Abs: 3.2 10*3/uL (ref 0.7–4.0)
MCH: 28.5 pg (ref 26.0–34.0)
MCHC: 33.6 g/dL (ref 30.0–36.0)
MCV: 84.9 fL (ref 78.0–100.0)
Monocytes Absolute: 0.9 10*3/uL (ref 0.1–1.0)
Monocytes Relative: 9 %
NEUTROS ABS: 6.2 10*3/uL (ref 1.7–7.7)
Neutrophils Relative %: 59 %
PLATELETS: 195 10*3/uL (ref 150–400)
RBC: 4.77 MIL/uL (ref 4.22–5.81)
RDW: 13.6 % (ref 11.5–15.5)
WBC: 10.4 10*3/uL (ref 4.0–10.5)

## 2017-09-09 LAB — COMPREHENSIVE METABOLIC PANEL
ALBUMIN: 3.2 g/dL — AB (ref 3.5–5.0)
ALT: 27 U/L (ref 17–63)
ANION GAP: 14 (ref 5–15)
AST: 19 U/L (ref 15–41)
Alkaline Phosphatase: 93 U/L (ref 38–126)
BUN: 9 mg/dL (ref 6–20)
CHLORIDE: 108 mmol/L (ref 101–111)
CO2: 18 mmol/L — ABNORMAL LOW (ref 22–32)
Calcium: 8.8 mg/dL — ABNORMAL LOW (ref 8.9–10.3)
Creatinine, Ser: 0.93 mg/dL (ref 0.61–1.24)
GFR calc Af Amer: >60 mL/min (ref >60)
GFR calc non Af Amer: >60 mL/min (ref >60)
GLUCOSE: 331 mg/dL — AB (ref 65–99)
POTASSIUM: 3.6 mmol/L (ref 3.5–5.1)
Sodium: 140 mmol/L (ref 135–145)
Total Bilirubin: 1.3 mg/dL — ABNORMAL HIGH (ref 0.3–1.2)
Total Protein: 7.7 g/dL (ref 6.5–8.1)

## 2017-09-09 LAB — HIV ANTIBODY (ROUTINE TESTING W REFLEX): HIV Screen 4th Generation wRfx: NONREACTIVE

## 2017-09-09 LAB — MAGNESIUM: Magnesium: 1.6 mg/dL — ABNORMAL LOW (ref 1.7–2.4)

## 2017-09-09 LAB — PHOSPHORUS: PHOSPHORUS: 3.8 mg/dL (ref 2.5–4.6)

## 2017-09-09 MED ORDER — HYDRALAZINE HCL 20 MG/ML IJ SOLN
10.0000 mg | Freq: Four times a day (QID) | INTRAMUSCULAR | Status: DC | PRN
  Administered 2017-09-09: 10 mg via INTRAVENOUS
  Filled 2017-09-09: qty 1

## 2017-09-09 MED ORDER — MAGNESIUM SULFATE 2 GM/50ML IV SOLN
2.0000 g | Freq: Once | INTRAVENOUS | Status: AC
  Administered 2017-09-09: 2 g via INTRAVENOUS
  Filled 2017-09-09: qty 50

## 2017-09-09 MED ORDER — SODIUM CHLORIDE 0.9 % IV SOLN
INTRAVENOUS | Status: DC
  Administered 2017-09-09 – 2017-09-11 (×3): via INTRAVENOUS

## 2017-09-09 MED ORDER — INSULIN ASPART 100 UNIT/ML ~~LOC~~ SOLN: 6.0000 [IU] | Freq: Three times a day (TID) | SUBCUTANEOUS | Status: DC

## 2017-09-09 MED ORDER — INSULIN ASPART 100 UNIT/ML ~~LOC~~ SOLN
0.0000 [IU] | Freq: Every day | SUBCUTANEOUS | Status: DC
Start: 2017-09-09 — End: 2017-09-12
  Administered 2017-09-09 – 2017-09-10 (×2): 3 [IU] via SUBCUTANEOUS
  Administered 2017-09-11: 2 [IU] via SUBCUTANEOUS

## 2017-09-09 MED ORDER — INSULIN DETEMIR 100 UNIT/ML ~~LOC~~ SOLN
20.0000 [IU] | Freq: Two times a day (BID) | SUBCUTANEOUS | Status: DC
  Administered 2017-09-09: 20 [IU] via SUBCUTANEOUS
  Filled 2017-09-09 (×2): qty 0.2

## 2017-09-09 MED ORDER — METOPROLOL TARTRATE 25 MG PO TABS
25.0000 mg | ORAL_TABLET | Freq: Two times a day (BID) | ORAL | Status: DC
  Administered 2017-09-09 – 2017-09-12 (×7): 25 mg via ORAL
  Filled 2017-09-09 (×8): qty 1

## 2017-09-09 MED ORDER — INSULIN ASPART 100 UNIT/ML ~~LOC~~ SOLN
7.0000 [IU] | Freq: Three times a day (TID) | SUBCUTANEOUS | Status: DC
  Administered 2017-09-09 – 2017-09-10 (×3): 7 [IU] via SUBCUTANEOUS

## 2017-09-09 MED ORDER — INSULIN ASPART 100 UNIT/ML ~~LOC~~ SOLN
4.0000 [IU] | Freq: Three times a day (TID) | SUBCUTANEOUS | Status: DC
  Administered 2017-09-09 (×2): 4 [IU] via SUBCUTANEOUS

## 2017-09-09 MED ORDER — INSULIN DETEMIR 100 UNIT/ML ~~LOC~~ SOLN
15.0000 [IU] | Freq: Two times a day (BID) | SUBCUTANEOUS | Status: DC
  Administered 2017-09-09: 15 [IU] via SUBCUTANEOUS
  Filled 2017-09-09 (×2): qty 0.15

## 2017-09-09 MED ORDER — INSULIN ASPART 100 UNIT/ML ~~LOC~~ SOLN
0.0000 [IU] | Freq: Three times a day (TID) | SUBCUTANEOUS | Status: DC
  Administered 2017-09-09 – 2017-09-10 (×2): 11 [IU] via SUBCUTANEOUS
  Administered 2017-09-10: 7 [IU] via SUBCUTANEOUS
  Administered 2017-09-10: 15 [IU] via SUBCUTANEOUS
  Administered 2017-09-11 – 2017-09-12 (×4): 7 [IU] via SUBCUTANEOUS
  Administered 2017-09-12: 4 [IU] via SUBCUTANEOUS

## 2017-09-09 NOTE — Progress Notes (Signed)
Inpatient Diabetes Program Recommendations  AACE/ADA: New Consensus Statement on Inpatient Glycemic Control (2015)  Target Ranges:  Prepandial:   less than 140 mg/dL      Peak postprandial:   less than 180 mg/dL (1-2 hours)      Critically ill patients:  140 - 180 mg/dL   Lab Results  Component Value Date   GLUCAP 295 (H) 09/09/2017   HGBA1C 13.3 (H) 09/08/2017    Review of Glycemic Control  Long discussion with pt regarding new diagnosis of DM. HgbA1C - 13.3%. 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 Will also need to establish care with PCP. Has given insulin injection with syringe. Will teach insulin pen administration this afternoon.  Pt appears motivated to control his blood sugars at home.  Have discussed with RN.  Agree with titration of Levemir to 15 units bid and Novolog 6 units tidwc. Will likely need more meal coverage insulin. Continue to monitor closely.  Thank you. Ailene Ardshonda Jackalyn Haith, RD, LDN, CDE Inpatient Diabetes Coordinator [phone]

## 2017-09-09 NOTE — Progress Notes (Signed)
PROGRESS NOTE    Dylan GageQuinton Henneke  ZOX:[id number] DOB: 07/13/1990 DOA: 09/07/2017 PCP: Pt, No Pcp Per (Inactive)   Brief Narrative:  Dylan Watts is a 27 y.o. male with morbid obesity but otherwise no significant PMH who presented to ED after 2-3 days of increasing nausea, poor appetitie, and malais and weakness. Reports concurrent increased urinary frequency and thirst. No recent illness or sick contacts. Prior to these sx he had been in his usual state of health. No fevers or cough or other focal resp sx. No new neurological sx. In the ED he was found to have a Blood glucose >700 then 652.+Urine ketones and glucose; pH7.26 and anion gap19; received NS boluses x 3L; He was insulin drip and admitted to the stepdown unit for Diabetic Ketoacidosis and was improving however gap started opening up again and Blood Sugars worsened.  Assessment & Plan:   Active Problems:   Diabetic ketoacidosis (HCC)   Obesity, Class III, BMI 40-49.9 (morbid obesity) (HCC)   Leukocytosis   AKI (acute kidney injury) (HCC)   Hypokalemia   Hypophosphatemia  DKA in setting of untreated diabetes and new diagnosis of Diabetes Mellitus, was improving but worsened today  -On Admission initial Glucose > 700s and AG 19.  -Insulin gtt via glucose stabilizer transitioned to Long Acting Levemir appropriately yesterday but Gap increasing and Sugars remain elevated -Continued IVF as per protocol with NS at a rate of 150 mL/hr and changed to D5W 1/2 NS when Blood Sugar is <250 and was stopped but will restart NS at 75 mL/hr  -Continued to Monitor BMP's q4h and transition to Long Acting Insulin when AG is closed and CO2 >20 x2; Transitioned with Insulin gtt remaining on for 1-2 Hours after Long Acting Insulin Administration  -Placed on Levemir 15 units and increased to BID but will now increase to 20 units BID, Moderate Novolog SSI AC/HS increased to Resistant Novolog SSI AC/HS, 4 units TIDwm increased to 6 and now 7 -Diabetes  Education Coordinator consulted and apperciate Recc's -Previous AG was 10 and CO2 was 22 yesterday, Repeat AG now 16, and CO2 was 16 this afternoon with Blood Sugar being 419 -Will repeat BMP q4h x2 to continue to Monitor  -Placed on Carb Modified Diet  -Continue Carb Modified Diet  -HbA1c was 13.3 -CBG's ranging from 154-295 -Will need teaching regarding insulin prior to DC -Recommending outpatient Endocrinology referral -Encourage weight loss and healthy eating habits -Continue to Monitor CBG's and BMP's closely   Morbid Obesity -BMI was 41.56 kg/m2  -Weight Loss Counseling given -Will need nutrition eval and outpatient follow up as above  Pseudohyponatremia -Improved  -Patient's Na+ on Admission was 130 and low in the setting of Hyperglycemia as Blood Sugar was >700 -Stopped D5W 1/2 NS as above and started NS at 75 mL/r  Leukocytosis, improving  -Patient's WBC went from 9.9 -> 13.7 -> 10.4 -Likely Reactive -Continue to Monitor for S/Sx of Infection -Repeat CBC in AM   Hyperkalemia -> Hypokalema, improved  -In the setting of DKA -Improved and is now Hypokalemia -Potassium was 7.0 on admission is now 3.8 -Replete with p.o. potassium chloride 40 mg x 2 doses as well as p.o. K-Phos Neutral 500 mg p.o. twice daily x2 doses yesterday  -Continue to monitor and replete as necessary -Repeat CMP in a.m.  AKI  -Mild. In the setting of Dehydration from DKA -BUN/Cr now improved to 9/0.93 and was 9/1.26 on admission -IVF Rehydration now restarted  -Repeat CMP in AM  Hypophosphatemia -Patient's phosphorus level was 1.8 and improved to 3.8 -Continue to monitor and replete as necessary -Repeat phosphorus level in a.m.  Hypomagnesemia -Patient's Mag Level was 1.6 -Replete with IV Mag Sulfate 2 grams -Continue to Monitor and Replete as Necessary -Repeat Mag Level in AM   Hyperbilirubinemia -Patient's T Bili was 1.3 this AM -Continue to Monitor and Repeat CMP in AM   Sinus  Tachycardia -Likely Reactive -Restarted IVF with NS at 75 mL/hr -Also started Metoprolol 25 mg po BID while hospitalized as BP and HR increased  DVT prophylaxis: Enoxaparin 40 mg sq q24h Code Status: FULL CODE Family Communication: No family present at bedside Disposition Plan: Anticipate D/C Home in next 24-48 hours if medically stable and improved.  Consultants:   None   Procedures: None   Antimicrobials:  Anti-infectives (From admission, onward)   None     Subjective: Seen and examined at bedside this was the best she is felt in few days.  No chest pain, shortness breath, nausea, vomiting.  Blood sugars still remain uncontrolled and started going up and anion gap started increasing.  I have adjusted insulin regimen and have informed the patient about additional blood draws and he is agreeable.  Objective: Vitals:   09/09/17 0057 09/09/17 0303 09/09/17 0525 09/09/17 0600  BP: (!) 160/92  (!) 179/98 (!) 164/86  Pulse: 96   94  Resp: 20   (!) 23  Temp:  97.6 F (36.4 C)    TempSrc:  Oral    SpO2: 100%   98%  Weight:      Height:        Intake/Output Summary (Last 24 hours) at 09/09/2017 7829 Last data filed at 09/09/2017 0500 Gross per 24 hour  Intake 2946.6 ml  Output 900 ml  Net 2046.6 ml   Filed Weights   09/07/17 1553  Weight: (!) 142.9 kg (315 lb)   Examination: Physical Exam:  Constitutional: Well-nourished, well-developed morbidly obese African-American male who is currently in no acute distress appears calm Eyes: Sclera anicteric.  Lids and conjunctive are normal ENMT: External ears and nose appear normal.  Grossly normal hearing Neck: Neck is supple with no JVD Respiratory: Diminished to auscultation bilaterally no appreciable wheezing, rales, rhonchi.  Patient has normal respiratory effort and is not tachypneic using accessory muscle breathe and has unlabored breathing. Cardiovascular: Tachycardic rate but regular rhythm.  No appreciable murmurs,  rubs, gallops.  No lower extremity edema noted Abdomen: Soft, nontender, distended secondary body habitus.  Bowel sounds present in 4 quadrants GU: Deferred Musculoskeletal: No contractures or cyanosis.  No joint deformities noted Skin: No appreciable rashes or lesions limited skin evaluation Neurologic: Cranial nerves II through XII grossly intact with no appreciable focal deficits. Psychiatric: Normal mood and affect.  Intact judgment insight.  Patient is awake, alert, and oriented x3  Data Reviewed: I have personally reviewed following labs and imaging studies  CBC: Recent Labs  Lab 09/07/17 1631 09/07/17 1700 09/07/17 1706 09/08/17 0546 09/09/17 0318  WBC  --  9.9  --  13.7* 10.4  NEUTROABS  --  7.5  --   --  6.2  HGB 17.3* 16.4 17.0 15.7 13.6  HCT 51.0 48.1 50.0 45.8 40.5  MCV  --  82.5  --  84.8 84.9  PLT  --  284  --  237 195   Basic Metabolic Panel: Recent Labs  Lab 09/07/17 2007 09/08/17 0219 09/08/17 0546 09/08/17 0748 09/08/17 0952 09/08/17 1421 09/09/17 0318  NA 140  143 144  --  143 142 140  K 4.1 4.2 3.8  --  3.4* 3.4* 3.6  CL 107 110 112*  --  110 110 108  CO2 14* 15* 18*  --  22 22 18*  GLUCOSE 624* 411* 237*  --  150* 241* 331*  BUN 9 9 8   --  7 7 9   CREATININE 1.18 1.26* 1.00  --  0.85 0.91 0.93  CALCIUM 9.3 9.2 9.2  --  9.3 9.0 8.8*  MG 2.3  --   --  2.0  --   --  1.6*  PHOS  --   --   --  1.8*  --   --  3.8   GFR: Estimated Creatinine Clearance: 175.8 mL/min (by C-G formula based on SCr of 0.93 mg/dL). Liver Function Tests: Recent Labs  Lab 09/07/17 2007 09/09/17 0318  AST 20 19  ALT 36 27  ALKPHOS 132* 93  BILITOT 1.3* 1.3*  PROT 9.4* 7.7  ALBUMIN 4.0 3.2*   Recent Labs  Lab 09/07/17 2007  LIPASE 24   No results for input(s): AMMONIA in the last 168 hours. Coagulation Profile: No results for input(s): INR, PROTIME in the last 168 hours. Cardiac Enzymes: No results for input(s): CKTOTAL, CKMB, CKMBINDEX, TROPONINI in the last 168  hours. BNP (last 3 results) No results for input(s): PROBNP in the last 8760 hours. HbA1C: Recent Labs    09/08/17 0546  HGBA1C 13.3*   CBG: Recent Labs  Lab 09/08/17 1621 09/08/17 1719 09/08/17 1822 09/08/17 2102 09/09/17 0723  GLUCAP 180* 183* 154* 265* 295*   Lipid Profile: No results for input(s): CHOL, HDL, LDLCALC, TRIG, CHOLHDL, LDLDIRECT in the last 72 hours. Thyroid Function Tests: No results for input(s): TSH, T4TOTAL, FREET4, T3FREE, THYROIDAB in the last 72 hours. Anemia Panel: No results for input(s): VITAMINB12, FOLATE, FERRITIN, TIBC, IRON, RETICCTPCT in the last 72 hours. Sepsis Labs: No results for input(s): PROCALCITON, LATICACIDVEN in the last 168 hours.  Recent Results (from the past 240 hour(s))  MRSA PCR Screening     Status: None   Collection Time: 09/08/17  2:19 AM  Result Value Ref Range Status   MRSA by PCR NEGATIVE NEGATIVE Final    Comment:        The GeneXpert MRSA Assay (FDA approved for NASAL specimens only), is one component of a comprehensive MRSA colonization surveillance program. It is not intended to diagnose MRSA infection nor to guide or monitor treatment for MRSA infections. Performed at [facility], [address], [place], [place] [address]     Radiology Studies: Dg Chest 2 View  Result Date: 09/07/2017 CLINICAL DATA:  Shortness of breath, dizziness and vomiting for the past 2 days. EXAM: CHEST - 2 VIEW COMPARISON:  12/27/2016 and chest CTA dated 12/28/2016 FINDINGS: Normal sized heart. Mild central peribronchial thickening. Clear lungs with normal vascularity. No pleural fluid. Normal appearing bones. IMPRESSION: Mild bronchitic changes. Electronically Signed   By: Beckie Salts M.D.   On: 09/07/2017 17:42   Scheduled Meds: . enoxaparin (LOVENOX) injection  40 mg Subcutaneous Q24H  . insulin aspart  0-15 Units Subcutaneous TID WC  . insulin aspart  0-5 Units Subcutaneous QHS  . insulin aspart  4 Units  Subcutaneous TID WC  . insulin detemir  15 Units Subcutaneous BID   Continuous Infusions: . insulin (NOVOLIN-R) infusion Stopped (09/08/17 1851)  . magnesium sulfate 1 - 4 g bolus IVPB      LOS: 1 day  Merlene Laughter, DO [facility] Pager [phone]  If 7PM-7AM, please contact night-coverage [email or web address] Password [facility] 09/09/2017, 7:38 AM

## 2017-09-09 NOTE — Progress Notes (Signed)
Inpatient Diabetes Program Recommendations  AACE/ADA: New Consensus Statement on Inpatient Glycemic Control (2015)  Target Ranges:  Prepandial:   less than 140 mg/dL      Peak postprandial:   less than 180 mg/dL (1-2 hours)      Critically ill patients:  140 - 180 mg/dL   Lab Results  Component Value Date   GLUCAP 365 (H) 09/09/2017   HGBA1C 13.3 (H) 09/08/2017    Review of Glycemic Control  CO2 continues dropping - now 16. AG improved to 14. RN states he ate very well at breakfast and lunch.  Meal coverage insulin of 4 units tidwc not adequate.  Orders changed:  Levemir increased to 20 units bid Novolog increased to 0-20 units tidwc and hs + 7 units tidwc.  Recommendations:  BMETs Q4H x 12H  Levemir 25 units QHS - 1x dose for 6/24 @ 2200. (Had 15 units this am which will give total of 40 units today.) Start Levemir 20 units bid on 6/25 at 1000.  Increase Novolog to 10 units tidwc for meal coverage insulin.  If CO2 continues to drop, may need to restart IV insulin.  Discussed with Dr. Marland McalpineSheikh.  Continue to follow.   Thank you. Ailene Ardshonda Skyelar Halliday, RD, LDN, CDE Inpatient Diabetes Coordinator [phone]

## 2017-09-10 DIAGNOSIS — N179 Acute kidney failure, unspecified: Secondary | ICD-10-CM | POA: Diagnosis not present

## 2017-09-10 DIAGNOSIS — E1110 Type 2 diabetes mellitus with ketoacidosis without coma: Secondary | ICD-10-CM | POA: Diagnosis not present

## 2017-09-10 DIAGNOSIS — E876 Hypokalemia: Secondary | ICD-10-CM | POA: Diagnosis not present

## 2017-09-10 LAB — PHOSPHORUS: PHOSPHORUS: 4.3 mg/dL (ref 2.5–4.6)

## 2017-09-10 LAB — GLUCOSE, CAPILLARY
GLUCOSE-CAPILLARY: 250 mg/dL — AB (ref 70–99)
GLUCOSE-CAPILLARY: 295 mg/dL — AB (ref 70–99)
Glucose-Capillary: 336 mg/dL — ABNORMAL HIGH (ref 70–99)
Glucose-Capillary: 259 mg/dL — ABNORMAL HIGH (ref 70–99)

## 2017-09-10 LAB — COMPREHENSIVE METABOLIC PANEL
ALK PHOS: 102 U/L (ref 38–126)
ALT: 23 U/L (ref 0–44)
AST: 16 U/L (ref 15–41)
Albumin: 3.2 g/dL — ABNORMAL LOW (ref 3.5–5.0)
Anion gap: 10 (ref 5–15)
BILIRUBIN TOTAL: 0.9 mg/dL (ref 0.3–1.2)
BUN: 8 mg/dL (ref 6–20)
CALCIUM: 8.6 mg/dL — AB (ref 8.9–10.3)
CO2: 22 mmol/L (ref 22–32)
CREATININE: 0.77 mg/dL (ref 0.61–1.24)
Chloride: 108 mmol/L (ref 98–111)
GFR calc non Af Amer: >60 mL/min (ref >60)
Glucose, Bld: 255 mg/dL — ABNORMAL HIGH (ref 70–99)
Potassium: 3.1 mmol/L — ABNORMAL LOW (ref 3.5–5.1)
Sodium: 140 mmol/L (ref 135–145)
TOTAL PROTEIN: 7.5 g/dL (ref 6.5–8.1)

## 2017-09-10 LAB — CBC WITH DIFFERENTIAL/PLATELET
Basophils Absolute: 0 10*3/uL (ref 0.0–0.1)
Basophils Relative: 0 %
Eosinophils Absolute: 0.2 10*3/uL (ref 0.0–0.7)
Eosinophils Relative: 2 %
HEMATOCRIT: 39.9 % (ref 39.0–52.0)
HEMOGLOBIN: 13.3 g/dL (ref 13.0–17.0)
LYMPHS ABS: 2.9 10*3/uL (ref 0.7–4.0)
LYMPHS PCT: 37 %
MCH: 28.4 pg (ref 26.0–34.0)
MCHC: 33.3 g/dL (ref 30.0–36.0)
MCV: 85.3 fL (ref 78.0–100.0)
Monocytes Absolute: 0.6 10*3/uL (ref 0.1–1.0)
Monocytes Relative: 8 %
NEUTROS ABS: 4 10*3/uL (ref 1.7–7.7)
NEUTROS PCT: 53 %
Platelets: 187 10*3/uL (ref 150–400)
RBC: 4.68 MIL/uL (ref 4.22–5.81)
RDW: 14 % (ref 11.5–15.5)
WBC: 7.7 10*3/uL (ref 4.0–10.5)

## 2017-09-10 LAB — MAGNESIUM: Magnesium: 1.7 mg/dL (ref 1.7–2.4)

## 2017-09-10 MED ORDER — POTASSIUM CHLORIDE CRYS ER 20 MEQ PO TBCR
40.0000 meq | EXTENDED_RELEASE_TABLET | Freq: Two times a day (BID) | ORAL | Status: DC
  Administered 2017-09-10 – 2017-09-12 (×5): 40 meq via ORAL
  Filled 2017-09-10 (×6): qty 2

## 2017-09-10 MED ORDER — INSULIN DETEMIR 100 UNIT/ML ~~LOC~~ SOLN
27.0000 [IU] | Freq: Two times a day (BID) | SUBCUTANEOUS | Status: DC
  Administered 2017-09-10 – 2017-09-11 (×3): 27 [IU] via SUBCUTANEOUS
  Filled 2017-09-10 (×4): qty 0.27

## 2017-09-10 MED ORDER — INSULIN ASPART 100 UNIT/ML ~~LOC~~ SOLN
10.0000 [IU] | Freq: Three times a day (TID) | SUBCUTANEOUS | Status: DC
  Administered 2017-09-10 – 2017-09-11 (×3): 10 [IU] via SUBCUTANEOUS

## 2017-09-10 NOTE — Progress Notes (Signed)
Inpatient Diabetes Program Recommendations  AACE/ADA: New Consensus Statement on Inpatient Glycemic Control (2015)  Target Ranges:  Prepandial:   less than 140 mg/dL      Peak postprandial:   less than 180 mg/dL (1-2 hours)      Critically ill patients:  140 - 180 mg/dL   Lab Results  Component Value Date   GLUCAP 336 (H) 09/10/2017   HGBA1C 13.3 (H) 09/08/2017    Review of Glycemic Control  Blood sugars improving slowly. Post-prandials elevated. Need increase in Novolog meal coverage.  Recommendations:  Increase Novolog to 10 units tidwc.  Instructed pt on insulin pen administration. Pt was able to return demonstration with no problems. Pt states he feels competent using syringe and pen for his insulin.  Continue with ongoing education. Allow pt to stick finger for glucose monitoring.  Spoke with MD regarding titration. Continue to follow closely.  Thank you. Ailene Ardshonda Roben Tatsch, RD, LDN, CDE Inpatient Diabetes Coordinator [phone]

## 2017-09-10 NOTE — Progress Notes (Signed)
Patient transferred from ICU, VSS, tele applied. Agree with previous RN assessment. Denies any needs at this time. Will continue to monitor.  

## 2017-09-10 NOTE — Progress Notes (Signed)
PROGRESS NOTE    Dylan Watts  ZOX:[id number] DOB: 07/28/1990 DOA: 09/07/2017 PCP: Pt, No Pcp Per (Inactive)   Brief Narrative:  Dylan Watts is a 27 y.o. male with morbid obesity but otherwise no significant PMH who presented to ED after 2-3 days of increasing nausea, poor appetitie, and malais and weakness. Reports concurrent increased urinary frequency and thirst. No recent illness or sick contacts. Prior to these sx he had been in his usual state of health. No fevers or cough or other focal resp sx. No new neurological sx. In the ED he was found to have a Blood glucose >700 then 652.+Urine ketones and glucose; pH7.26 and anion gap19; received NS boluses x 3L; He was insulin drip and admitted to the stepdown unit for Diabetic Ketoacidosis and was improving however gap started opening up again and Blood Sugars worsened. Gap is now closed but remains hyperglycemic so will continue to adjust regimen and anticipate D/C in AM if medically stable and Blood Sugars are better.   Assessment & Plan:   Active Problems:   Diabetic ketoacidosis (HCC)   Obesity, Class III, BMI 40-49.9 (morbid obesity) (HCC)   Leukocytosis   AKI (acute kidney injury) (HCC)   Hypokalemia   Hypophosphatemia  DKA in setting of untreated diabetes and new diagnosis of Diabetes Mellitus, was improving but worsened yesterday and is improved today -On Admission initial Glucose > 700s and AG 19.  -Insulin gtt via glucose stabilizer transitioned to Long Acting Levemir appropriately yesterday but Gap increasing and Sugars remain elevated -Continued IVF as per protocol with NS at a rate of 150 mL/hr and changed to D5W 1/2 NS when Blood Sugar is <250 and was stopped but will restart NS at 75 mL/hr  -Continued to Monitor BMP's q4h and transition to Long Acting Insulin when AG is closed and CO2 >20 x2; Transitioned with Insulin gtt remaining on for 1-2 Hours after Long Acting Insulin Administration  -Placed on Levemir 15 units  initially and increased to BID but will now increase to 20 units BID; Will further increase to 27 units BID -Moderate Novolog SSI AC/HS increased to Resistant Novolog SSI AC/HS,  -Novology 4 units TIDwm increased to 10 units with meals  -Diabetes Education Coordinator consulted and apperciate Recc's -Repeat AG now 16, and CO2 was 16 yesterday afternoon with Blood Sugar being 419 -Patient's anion gap is now closed 10 and patient CO2 is 22 this morning however continues to be hyperglycemic and uncontrolled -Placed on Carb Modified Diet  -Continue Carb Modified Diet  -HbA1c was 13.3 -CBG's ranging from 265-336 -Will need teaching regarding insulin prior to DC -Recommending outpatient Endocrinology referral -Encourage weight loss and healthy eating habits -Continue to Monitor CBG's and BMP's closely and continue to adjust insulin as necessary -Anticipate discharge in the a.m. if blood sugars are better controlled  Morbid Obesity -BMI was 41.56 kg/m2  -Weight Loss Counseling given -Will need nutrition eval and outpatient follow up as above  Pseudohyponatremia -Improved  -Patient's Na+ on Admission was 130 and low in the setting of Hyperglycemia as Blood Sugar was >700 -Stopped D5W 1/2 NS as above and started NS at 75 mL/hr and will continue today  Leukocytosis, improving  -Patient's WBC went from 9.9 -> 13.7 -> 10.4 -> 7.7 -Likely Reactive -Continue to Monitor for S/Sx of Infection -Repeat CBC in AM   Hypokalemia -Had Hyperkalemia In the setting of DKA -Improved and is now Hypokalemia -Potassium was 7.0 on admission is now 3.1 -Replete with p.o. potassium  chloride 40 mg x 2 doses  Daily  -Continue to monitor and replete as necessary -Repeat CMP in a.m.  AKI  -Mild. In the setting of Dehydration from DKA -BUN/Cr now improved to 8/0.77 and was 9/1.26 on admission -IVF Rehydration now restarted as above -Repeat CMP in AM   Hypophosphatemia -Patient's phosphorus level was 1.8  and improved to 4.3 -Continue to monitor and replete as necessary -Repeat phosphorus level in a.m.  Hypomagnesemia -Patient's Mag Level was 1.7 -Continue to Monitor and Replete as Necessary -Repeat Mag Level in AM   Hyperbilirubinemia -Patient's T Bili was 1.3 and improved to 0.9 -Continue to Monitor and Repeat CMP in AM   Sinus Tachycardia -Likely Reactive and improved  -Restarted IVF with NS at 75 mL/hr -Also started Metoprolol 25 mg po BID while hospitalized as BP and HR increased and will continue  DVT prophylaxis: Enoxaparin 40 mg sq q24h Code Status: FULL CODE Family Communication: No family present at bedside Disposition Plan: Transfer to Telemetry; Anticipate D/C Home in next 24-48 hours if medically stable and improved with Blood Sugars  Consultants:   None   Procedures: None   Antimicrobials:  Anti-infectives (From admission, onward)   None     Subjective: Seen and examined at bedside and was very pleasant and stated this is the best that he is fallen 2 weeks.  No chest pain, shortness breath, nausea, vomiting.  States he has been trying watch his diet and have 0-calorie drinks.  No other concerns or complaints at this time and understands that his insulin needs to be adjusted further.  Objective: Vitals:   09/10/17 0406 09/10/17 0700 09/10/17 0800 09/10/17 1200  BP: (!) 141/97 (!) 145/93 (!) 144/90 (!) 146/88  Pulse: 90 92 100 91  Resp: (!) 21 (!) 21 (!) 23 17  Temp: 97.6 F (36.4 C)  (!) 97.5 F (36.4 C)   TempSrc: Oral  Oral   SpO2: 100% 99% 99% 98%  Weight:      Height:        Intake/Output Summary (Last 24 hours) at 09/10/2017 1424 Last data filed at 09/10/2017 1400 Gross per 24 hour  Intake 2271.25 ml  Output 700 ml  Net 1571.25 ml   Filed Weights   09/07/17 1553  Weight: (!) 142.9 kg (315 lb)   Examination: Physical Exam:  Constitutional: Well-nourished, well-developed morbidly obese African-American who is currently in no distress  appears calm and is very pleasant to speak with. Eyes: Sclera anicteric.  Lids and conjunctive are normal. ENMT: External ears and nose appear normal.  Grossly normal hearing Neck: Neck is supple with no JVD Respiratory: Diminished to auscultation bilaterally with no appreciable wheezing, rales, rhonchi.  Patient has normal respiratory effort is not tachypneic using extra muscles to breathe. Cardiovascular: Regular rate and rhythm today but slightly on the faster side.  No appreciable murmurs, rubs, gallops.  No lower extremity edema noted Abdomen: Soft, nontender, distended secondary body habitus.  Bowel sounds present all 4 quadrants GU: Deferred Musculoskeletal: No contractures or cyanosis.  No joint deformities noted  Skin: Skin is warm and dry with no appreciable rashes or lesions on limited skin evaluation Neurologic: Cranial nerves II through XII grossly intact no appreciable focal deficits Psychiatric: Normal mood and affect.  Intact judgment intact.  Patient is awake and alert and oriented x3  Data Reviewed: I have personally reviewed following labs and imaging studies  CBC: Recent Labs  Lab 09/07/17 1700 09/07/17 1706 09/08/17 0546 09/09/17 0318 09/10/17  0356  WBC 9.9  --  13.7* 10.4 7.7  NEUTROABS 7.5  --   --  6.2 4.0  HGB 16.4 17.0 15.7 13.6 13.3  HCT 48.1 50.0 45.8 40.5 39.9  MCV 82.5  --  84.8 84.9 85.3  PLT 284  --  237 195 187   Basic Metabolic Panel: Recent Labs  Lab 09/07/17 2007  09/08/17 0748  09/09/17 0318 09/09/17 1045 09/09/17 1523 09/09/17 1944 09/10/17 0356  NA 140   < >  --    < > 140 139 140 143 140  K 4.1   < >  --    < > 3.6 3.8 4.0 3.9 3.1*  CL 107   < >  --    < > 108 106 110 111 108  CO2 14*   < >  --    < > 18* 17* 16* 20* 22  GLUCOSE 624*   < >  --    < > 331* 419* 336* 309* 255*  BUN 9   < >  --    < > 9 8 9 9 8   CREATININE 1.18   < >  --    < > 0.93 0.97 0.83 0.87 0.77  CALCIUM 9.3   < >  --    < > 8.8* 8.8* 8.8* 8.9 8.6*  MG 2.3   --  2.0  --  1.6*  --   --   --  1.7  PHOS  --   --  1.8*  --  3.8  --   --   --  4.3   < > = values in this interval not displayed.   GFR: Estimated Creatinine Clearance: 204.4 mL/min (by C-G formula based on SCr of 0.77 mg/dL). Liver Function Tests: Recent Labs  Lab 09/07/17 2007 09/09/17 0318 09/10/17 0356  AST 20 19 16   ALT 36 27 23  ALKPHOS 132* 93 102  BILITOT 1.3* 1.3* 0.9  PROT 9.4* 7.7 7.5  ALBUMIN 4.0 3.2* 3.2*   Recent Labs  Lab 09/07/17 2007  LIPASE 24   No results for input(s): AMMONIA in the last 168 hours. Coagulation Profile: No results for input(s): INR, PROTIME in the last 168 hours. Cardiac Enzymes: No results for input(s): CKTOTAL, CKMB, CKMBINDEX, TROPONINI in the last 168 hours. BNP (last 3 results) No results for input(s): PROBNP in the last 8760 hours. HbA1C: Recent Labs    09/08/17 0546  HGBA1C 13.3*   CBG: Recent Labs  Lab 09/09/17 0723 09/09/17 1156 09/09/17 1716 09/09/17 2107 09/10/17 0736  GLUCAP 295* 365* 272* 265* 336*   Lipid Profile: No results for input(s): CHOL, HDL, LDLCALC, TRIG, CHOLHDL, LDLDIRECT in the last 72 hours. Thyroid Function Tests: No results for input(s): TSH, T4TOTAL, FREET4, T3FREE, THYROIDAB in the last 72 hours. Anemia Panel: No results for input(s): VITAMINB12, FOLATE, FERRITIN, TIBC, IRON, RETICCTPCT in the last 72 hours. Sepsis Labs: No results for input(s): PROCALCITON, LATICACIDVEN in the last 168 hours.  Recent Results (from the past 240 hour(s))  MRSA PCR Screening     Status: None   Collection Time: 09/08/17  2:19 AM  Result Value Ref Range Status   MRSA by PCR NEGATIVE NEGATIVE Final    Comment:        The GeneXpert MRSA Assay (FDA approved for NASAL specimens only), is one component of a comprehensive MRSA colonization surveillance program. It is not intended to diagnose MRSA infection nor to guide or monitor treatment for MRSA infections.  Performed at [facility],  [address] [place], [place] [address]     Radiology Studies: No results found. Scheduled Meds: . enoxaparin (LOVENOX) injection  40 mg Subcutaneous Q24H  . insulin aspart  0-20 Units Subcutaneous TID WC  . insulin aspart  0-5 Units Subcutaneous QHS  . insulin aspart  10 Units Subcutaneous TID WC  . insulin detemir  27 Units Subcutaneous BID  . metoprolol tartrate  25 mg Oral BID  . potassium chloride  40 mEq Oral BID   Continuous Infusions: . sodium chloride 75 mL/hr at 09/10/17 0408    LOS: 2 days   Merlene Laughter, DO [facility] Pager [phone]  If 7PM-7AM, please contact night-coverage [email or web address] Password [facility] 09/10/2017, 2:24 PM

## 2017-09-10 NOTE — Progress Notes (Signed)
Reviewed how to give insulin injections.  Patient able to give self insulin shot without any problem.  Able to walk around unit and in room.  Continue teaching about diabetes and answer any questions patient has about diabetes.  Norely Schlick Debroah LoopArnold RN

## 2017-09-11 DIAGNOSIS — E876 Hypokalemia: Secondary | ICD-10-CM | POA: Diagnosis not present

## 2017-09-11 DIAGNOSIS — N179 Acute kidney failure, unspecified: Secondary | ICD-10-CM | POA: Diagnosis not present

## 2017-09-11 DIAGNOSIS — E1110 Type 2 diabetes mellitus with ketoacidosis without coma: Secondary | ICD-10-CM | POA: Diagnosis not present

## 2017-09-11 DIAGNOSIS — E1310 Other specified diabetes mellitus with ketoacidosis without coma: Secondary | ICD-10-CM | POA: Diagnosis not present

## 2017-09-11 DIAGNOSIS — D72829 Elevated white blood cell count, unspecified: Secondary | ICD-10-CM | POA: Diagnosis not present

## 2017-09-11 LAB — CBC WITH DIFFERENTIAL/PLATELET
BASOS ABS: 0 10*3/uL (ref 0.0–0.1)
Basophils Relative: 0 %
Eosinophils Absolute: 0.2 10*3/uL (ref 0.0–0.7)
Eosinophils Relative: 2 %
HEMATOCRIT: 36.5 % — AB (ref 39.0–52.0)
HEMOGLOBIN: 12.1 g/dL — AB (ref 13.0–17.0)
Lymphocytes Relative: 39 %
Lymphs Abs: 2.9 10*3/uL (ref 0.7–4.0)
MCH: 28.3 pg (ref 26.0–34.0)
MCHC: 33.2 g/dL (ref 30.0–36.0)
MCV: 85.3 fL (ref 78.0–100.0)
MONOS PCT: 8 %
Monocytes Absolute: 0.6 10*3/uL (ref 0.1–1.0)
NEUTROS ABS: 3.7 10*3/uL (ref 1.7–7.7)
NEUTROS PCT: 51 %
PLATELETS: 161 10*3/uL (ref 150–400)
RBC: 4.28 MIL/uL (ref 4.22–5.81)
RDW: 13.8 % (ref 11.5–15.5)
WBC: 7.3 10*3/uL (ref 4.0–10.5)

## 2017-09-11 LAB — COMPREHENSIVE METABOLIC PANEL
ALBUMIN: 2.7 g/dL — AB (ref 3.5–5.0)
ALK PHOS: 88 U/L (ref 38–126)
ALT: 22 U/L (ref 0–44)
ANION GAP: 10 (ref 5–15)
AST: 21 U/L (ref 15–41)
BUN: 7 mg/dL (ref 6–20)
CO2: 22 mmol/L (ref 22–32)
Calcium: 8.2 mg/dL — ABNORMAL LOW (ref 8.9–10.3)
Chloride: 106 mmol/L (ref 98–111)
Creatinine, Ser: 0.62 mg/dL (ref 0.61–1.24)
GFR calc non Af Amer: >60 mL/min (ref >60)
GLUCOSE: 239 mg/dL — AB (ref 70–99)
POTASSIUM: 3.3 mmol/L — AB (ref 3.5–5.1)
SODIUM: 138 mmol/L (ref 135–145)
TOTAL PROTEIN: 6.6 g/dL (ref 6.5–8.1)
Total Bilirubin: 0.8 mg/dL (ref 0.3–1.2)

## 2017-09-11 LAB — MAGNESIUM: Magnesium: 1.5 mg/dL — ABNORMAL LOW (ref 1.7–2.4)

## 2017-09-11 LAB — GLUCOSE, CAPILLARY
GLUCOSE-CAPILLARY: 247 mg/dL — AB (ref 70–99)
GLUCOSE-CAPILLARY: 249 mg/dL — AB (ref 70–99)
Glucose-Capillary: 205 mg/dL — ABNORMAL HIGH (ref 70–99)
Glucose-Capillary: 226 mg/dL — ABNORMAL HIGH (ref 70–99)

## 2017-09-11 LAB — PHOSPHORUS: PHOSPHORUS: 4.3 mg/dL (ref 2.5–4.6)

## 2017-09-11 MED ORDER — INSULIN DETEMIR 100 UNIT/ML ~~LOC~~ SOLN
32.0000 [IU] | Freq: Two times a day (BID) | SUBCUTANEOUS | Status: DC
  Administered 2017-09-11 – 2017-09-12 (×2): 32 [IU] via SUBCUTANEOUS
  Filled 2017-09-11 (×3): qty 0.32

## 2017-09-11 MED ORDER — ENOXAPARIN SODIUM 80 MG/0.8ML ~~LOC~~ SOLN
80.0000 mg | SUBCUTANEOUS | Status: DC
  Administered 2017-09-12: 80 mg via SUBCUTANEOUS
  Filled 2017-09-11: qty 0.8

## 2017-09-11 MED ORDER — MAGNESIUM SULFATE 2 GM/50ML IV SOLN
2.0000 g | Freq: Once | INTRAVENOUS | Status: AC
  Administered 2017-09-11: 2 g via INTRAVENOUS
  Filled 2017-09-11: qty 50

## 2017-09-11 MED ORDER — INSULIN ASPART 100 UNIT/ML ~~LOC~~ SOLN
14.0000 [IU] | Freq: Three times a day (TID) | SUBCUTANEOUS | Status: DC
  Administered 2017-09-12 (×2): 14 [IU] via SUBCUTANEOUS

## 2017-09-11 NOTE — Progress Notes (Signed)
PROGRESS NOTE  Vitor Overbaugh BJY:[id number] DOB: 1991-01-25 DOA: 09/07/2017 PCP: Pt, No Pcp Per (Inactive)  HPI/Recap of past 24 hours: Mr. Otting is a 27 year old morbidly obese male with no significant past medical history presented to ED [facility] with complaints of nausea poor appetite and malaise of 2 to 3 days duration.  Blood sugar on presentation greater than 700 with positive urine ketones and anion gap of 19.  Admitted for DKA in the setting of newly diagnosed type 2 diabetes.  09/11/2017 patient seen and examined with his mother at bedside.  Hemoglobin A1c 13.3.  Has been transitioned to long-acting and short-acting insulin.  Diabetes coordinator following.  He has no new complaints.  Assessment/Plan: Active Problems:   Diabetic ketoacidosis (HCC)   Obesity, Class III, BMI 40-49.9 (morbid obesity) (HCC)   Leukocytosis   AKI (acute kidney injury) (HCC)   Hypokalemia   Hypophosphatemia  DKA in the setting of newly diagnosed type 2 diabetes DKA has resolved Continue long-acting and short acting insulin Continue to titrate subcu insulin for better coverage Hemoglobin A1c 13.3 09/08/17 Continue Accu-Cheks Continue diabetic diet Continue insulin education Diabetic coordinator following.  Appreciate recommendations  Morbid obesity BMI 51 Recommend weight loss outpatient Recommend regular physical activity and and healthy diet  Hypokalemia Potassium 3.3 Repleted with p.o. potassium 40 mEq Repeat BMP and magnesium level in the morning  Hypomagnesemia Magnesium 1.5 Repleted with 2 g of IV magnesium      Code Status: Full Code  Family Communication: Mother at bedside  Disposition Plan: Home when clinically stable   Consultants:  Diabetic coordinator  Procedures:  None  Antimicrobials:  None  DVT prophylaxis: Subcu Lovenox daily   Objective: Vitals:   09/10/17 2137 09/11/17 0644 09/11/17 0918 09/11/17 1436  BP: 111/64 134/88 (!) 145/87 129/64  Pulse:  (!) 104 96 93 93  Resp: 18 18 20 20   Temp: 97.6 F (36.4 C) 98.2 F (36.8 C) 97.7 F (36.5 C) 97.7 F (36.5 C)  TempSrc: Oral Oral Oral Oral  SpO2: 100% 99% 99% 99%  Weight:      Height:        Intake/Output Summary (Last 24 hours) at 09/11/2017 1832 Last data filed at 09/11/2017 1500 Gross per 24 hour  Intake 1200 ml  Output -  Net 1200 ml   Filed Weights   09/07/17 1553 09/10/17 1638  Weight: (!) 142.9 kg (315 lb) (!) 175.7 kg (387 lb 5.6 oz)    Exam:  . General: 27 y.o. year-old male well developed well nourished in no acute distress.  Alert and oriented x3. . Cardiovascular: Regular rate and rhythm with no rubs or gallops.  No thyromegaly or JVD noted.   Marland Kitchen Respiratory: Clear to auscultation with no wheezes or rales. Good inspiratory effort. . Abdomen: Soft nontender nondistended with normal bowel sounds x4 quadrants. . Musculoskeletal: No lower extremity edema. 2/4 pulses in all 4 extremities. . Skin: No ulcerative lesions noted or rashes, . Psychiatry: Mood is appropriate for condition and setting   Data Reviewed: CBC: Recent Labs  Lab 09/07/17 1700 09/07/17 1706 09/08/17 0546 09/09/17 0318 09/10/17 0356 09/11/17 0523  WBC 9.9  --  13.7* 10.4 7.7 7.3  NEUTROABS 7.5  --   --  6.2 4.0 3.7  HGB 16.4 17.0 15.7 13.6 13.3 12.1*  HCT 48.1 50.0 45.8 40.5 39.9 36.5*  MCV 82.5  --  84.8 84.9 85.3 85.3  PLT 284  --  237 195 187 161   Basic  Metabolic Panel: Recent Labs  Lab 09/07/17 2007  09/08/17 1610  09/09/17 0318 09/09/17 1045 09/09/17 1523 09/09/17 1944 09/10/17 0356 09/11/17 0523  NA 140   < >  --    < > 140 139 140 143 140 138  K 4.1   < >  --    < > 3.6 3.8 4.0 3.9 3.1* 3.3*  CL 107   < >  --    < > 108 106 110 111 108 106  CO2 14*   < >  --    < > 18* 17* 16* 20* 22 22  GLUCOSE 624*   < >  --    < > 331* 419* 336* 309* 255* 239*  BUN 9   < >  --    < > 9 8 9 9 8 7   CREATININE 1.18   < >  --    < > 0.93 0.97 0.83 0.87 0.77 0.62  CALCIUM 9.3   < >   --    < > 8.8* 8.8* 8.8* 8.9 8.6* 8.2*  MG 2.3  --  2.0  --  1.6*  --   --   --  1.7 1.5*  PHOS  --   --  1.8*  --  3.8  --   --   --  4.3 4.3   < > = values in this interval not displayed.   GFR: Estimated Creatinine Clearance: 229.8 mL/min (by C-G formula based on SCr of 0.62 mg/dL). Liver Function Tests: Recent Labs  Lab 09/07/17 2007 09/09/17 0318 09/10/17 0356 09/11/17 0523  AST 20 19 16 21   ALT 36 27 23 22   ALKPHOS 132* 93 102 88  BILITOT 1.3* 1.3* 0.9 0.8  PROT 9.4* 7.7 7.5 6.6  ALBUMIN 4.0 3.2* 3.2* 2.7*   Recent Labs  Lab 09/07/17 2007  LIPASE 24   No results for input(s): AMMONIA in the last 168 hours. Coagulation Profile: No results for input(s): INR, PROTIME in the last 168 hours. Cardiac Enzymes: No results for input(s): CKTOTAL, CKMB, CKMBINDEX, TROPONINI in the last 168 hours. BNP (last 3 results) No results for input(s): PROBNP in the last 8760 hours. HbA1C: No results for input(s): HGBA1C in the last 72 hours. CBG: Recent Labs  Lab 09/10/17 1543 09/10/17 2138 09/11/17 0728 09/11/17 1139 09/11/17 1613  GLUCAP 259* 295* 247* 249* 205*   Lipid Profile: No results for input(s): CHOL, HDL, LDLCALC, TRIG, CHOLHDL, LDLDIRECT in the last 72 hours. Thyroid Function Tests: No results for input(s): TSH, T4TOTAL, FREET4, T3FREE, THYROIDAB in the last 72 hours. Anemia Panel: No results for input(s): VITAMINB12, FOLATE, FERRITIN, TIBC, IRON, RETICCTPCT in the last 72 hours. Urine analysis:    Component Value Date/Time   COLORURINE STRAW (A) 09/07/2017 1605   APPEARANCEUR CLEAR 09/07/2017 1605   LABSPEC 1.028 09/07/2017 1605   PHURINE 5.0 09/07/2017 1605   GLUCOSEU >=500 (A) 09/07/2017 1605   HGBUR NEGATIVE 09/07/2017 1605   BILIRUBINUR NEGATIVE 09/07/2017 1605   KETONESUR 80 (A) 09/07/2017 1605   PROTEINUR NEGATIVE 09/07/2017 1605   UROBILINOGEN 0.2 08/14/2016 1453   NITRITE NEGATIVE 09/07/2017 1605   LEUKOCYTESUR NEGATIVE 09/07/2017 1605   Sepsis  Labs: @LABRCNTIP (procalcitonin:4,lacticidven:4)  ) Recent Results (from the past 240 hour(s))  MRSA PCR Screening     Status: None   Collection Time: 09/08/17  2:19 AM  Result Value Ref Range Status   MRSA by PCR NEGATIVE NEGATIVE Final    Comment:  The GeneXpert MRSA Assay (FDA approved for NASAL specimens only), is one component of a comprehensive MRSA colonization surveillance program. It is not intended to diagnose MRSA infection nor to guide or monitor treatment for MRSA infections. Performed at [facility], [address] [place], [place] [id number]       Studies: No results found.  Scheduled Meds: . [START ON 09/12/2017] enoxaparin (LOVENOX) injection  80 mg Subcutaneous Q24H  . insulin aspart  0-20 Units Subcutaneous TID WC  . insulin aspart  0-5 Units Subcutaneous QHS  . [START ON 09/12/2017] insulin aspart  14 Units Subcutaneous TID WC  . insulin detemir  32 Units Subcutaneous BID  . metoprolol tartrate  25 mg Oral BID  . potassium chloride  40 mEq Oral BID    Continuous Infusions:   LOS: 3 days     Darlin Droparole N Angelisa Winthrop, MD [facility] Pager [phone]  If 7PM-7AM, please contact night-coverage [email or web address] Password [facility] 09/11/2017, 6:32 PM

## 2017-09-11 NOTE — Progress Notes (Signed)
Inpatient Diabetes Program Recommendations  AACE/ADA: New Consensus Statement on Inpatient Glycemic Control (2015)  Target Ranges:  Prepandial:   less than 140 mg/dL      Peak postprandial:   less than 180 mg/dL (1-2 hours)      Critically ill patients:  140 - 180 mg/dL   Lab Results  Component Value Date   GLUCAP 249 (H) 09/11/2017   HGBA1C 13.3 (H) 09/08/2017    Review of Glycemic Control  Blood sugars continue > 180 mg/dL. Continue titration.  Increase Levemir to 32 units bid Increase Novolog to 14 units tidwc  Continue to monitor.  Thank you. Dylan Watts, RD, LDN, CDE Inpatient Diabetes Coordinator [phone]

## 2017-09-12 DIAGNOSIS — E1110 Type 2 diabetes mellitus with ketoacidosis without coma: Secondary | ICD-10-CM | POA: Diagnosis not present

## 2017-09-12 DIAGNOSIS — N179 Acute kidney failure, unspecified: Secondary | ICD-10-CM | POA: Diagnosis not present

## 2017-09-12 DIAGNOSIS — E876 Hypokalemia: Secondary | ICD-10-CM | POA: Diagnosis not present

## 2017-09-12 LAB — BASIC METABOLIC PANEL
Anion gap: 9 (ref 5–15)
BUN: 6 mg/dL (ref 6–20)
CALCIUM: 8.7 mg/dL — AB (ref 8.9–10.3)
CO2: 26 mmol/L (ref 22–32)
CREATININE: 0.67 mg/dL (ref 0.61–1.24)
Chloride: 102 mmol/L (ref 98–111)
GFR calc Af Amer: >60 mL/min (ref >60)
Glucose, Bld: 207 mg/dL — ABNORMAL HIGH (ref 70–99)
POTASSIUM: 3.7 mmol/L (ref 3.5–5.1)
SODIUM: 137 mmol/L (ref 135–145)

## 2017-09-12 LAB — CBC
HCT: 41.2 % (ref 39.0–52.0)
Hemoglobin: 13.8 g/dL (ref 13.0–17.0)
MCH: 28.8 pg (ref 26.0–34.0)
MCHC: 33.5 g/dL (ref 30.0–36.0)
MCV: 86 fL (ref 78.0–100.0)
PLATELETS: 156 10*3/uL (ref 150–400)
RBC: 4.79 MIL/uL (ref 4.22–5.81)
RDW: 13.7 % (ref 11.5–15.5)
WBC: 6.8 10*3/uL (ref 4.0–10.5)

## 2017-09-12 LAB — GLUCOSE, CAPILLARY
GLUCOSE-CAPILLARY: 197 mg/dL — AB (ref 70–99)
Glucose-Capillary: 209 mg/dL — ABNORMAL HIGH (ref 70–99)

## 2017-09-12 LAB — MAGNESIUM: MAGNESIUM: 1.7 mg/dL (ref 1.7–2.4)

## 2017-09-12 MED ORDER — BLOOD GLUCOSE MONITOR KIT: PACK | 0 refills | Status: DC

## 2017-09-12 MED ORDER — INSULIN ASPART 100 UNIT/ML ~~LOC~~ SOLN: 0.0000 [IU] | Freq: Every day | SUBCUTANEOUS | 0 refills | Status: DC

## 2017-09-12 MED ORDER — METOPROLOL TARTRATE 25 MG PO TABS: 25.0000 mg | ORAL_TABLET | Freq: Two times a day (BID) | ORAL | 0 refills | Status: DC

## 2017-09-12 MED ORDER — INSULIN ASPART 100 UNIT/ML ~~LOC~~ SOLN: 14.0000 [IU] | Freq: Three times a day (TID) | SUBCUTANEOUS | 0 refills | Status: DC

## 2017-09-12 MED ORDER — INSULIN DETEMIR 100 UNIT/ML ~~LOC~~ SOLN: 32.0000 [IU] | Freq: Two times a day (BID) | SUBCUTANEOUS | 0 refills | Status: DC

## 2017-09-12 MED ORDER — MAGNESIUM SULFATE 2 GM/50ML IV SOLN
2.0000 g | Freq: Once | INTRAVENOUS | Status: AC
  Administered 2017-09-12: 2 g via INTRAVENOUS
  Filled 2017-09-12: qty 50

## 2017-09-12 NOTE — Care Management Note (Signed)
Case Management Note  Patient Details  Name: Dylan GageQuinton Watts MRN: [id number] Date of Birth: [birth year]  Subjective/Objective:  No health insurance, no pcp, unable to afford insulin. Provided w/MAT program-1x use/12 calendar months/select pharmacies/$3 co pay(can afford). PCP listing provided-encouraged [facility], [facility] goes to either of these /clinics.Patient voiced understanding. No further CM needs.                  Action/Plan:d/c home, MATCH program.   Expected Discharge Date:  09/12/17               Expected Discharge Plan:  Home/Self Care  In-House Referral:  NA  Discharge planning Services  CM Consult, MATCH Program, Indigent [facility] Clinic  Post Acute Care Choice:  NA Choice offered to:  NA  DME Arranged:  N/A DME Agency:  NA  HH Arranged:  NA HH Agency:  NA  Status of Service:  Completed, signed off  If discussed at Long Length of Stay Meetings, dates discussed:    Additional Comments:  Lanier ClamMahabir, Miranda Garber, RN 09/12/2017, 3:46 PM

## 2017-09-12 NOTE — Discharge Summary (Signed)
Discharge Summary  Dylan Watts MLJ:[id number] DOB: [birth year]  PCP: Pt, No Pcp Per (Inactive)  Admit date: 09/07/2017 Discharge date: 09/12/2017  Time spent: 25 minutes  Recommendations for Outpatient Follow-up:  1. Follow-up with your PCP within a week 2. Take your medications as prescribed 3. Weight loss with regular exercise and healthy diet  Discharge Diagnoses:  Active Hospital Problems   Diagnosis Date Noted  . Obesity, Class III, BMI 40-49.9 (morbid obesity) ([place]) 09/08/2017  . Leukocytosis 09/08/2017  . AKI (acute kidney injury) ([place]) 09/08/2017  . Hypokalemia 09/08/2017  . Hypophosphatemia 09/08/2017  . Diabetic ketoacidosis ([place]) 09/07/2017    Resolved Hospital Problems  No resolved problems to display.    Discharge Condition:  stable  Diet recommendation: Diabetic diet  Vitals:   09/12/17 0520 09/12/17 0859  BP: 122/75 136/67  Pulse: 85 94  Resp: 20 (!) 28  Temp: 97.7 F (36.5 C)   SpO2: 100% 99%    History of present illness:  Dylan Watts is a 27 year old morbidly obese male with no significant past medical history presented to ED [facility] with complaints of nausea poor appetite and malaise of 2 to 3 days duration.  Blood sugar on presentation greater than 700 with positive urine ketones and anion gap of 19.  Admitted for DKA in the setting of newly diagnosed type 2 diabetes.  09/11/2017 patient seen and examined with his mother at bedside.  Hemoglobin A1c 13.3.  Has been transitioned to long-acting and short-acting insulin.  Diabetes coordinator following.  He has no new complaints.  09/12/2017: Patient seen and examined at his bedside.  He has no new complaints.  Denies nausea, abdominal pain or polyuria.  On the day of discharge patient was hemodynamically stable.  He will need to follow-up with his PCP post hospitalization.   Hospital Course:  Active Problems:   Diabetic ketoacidosis ([facility])   Obesity, Class III, BMI 40-49.9 (morbid obesity) ([facility])   Leukocytosis   AKI (acute kidney injury) ([place])   Hypokalemia   Hypophosphatemia  DKA in the setting of newly diagnosed type 2 diabetes DKA has resolved Continue long-acting and short acting insulin Hemoglobin A1c 13.3 09/08/17 Continue Accu-Cheks Continue diabetic diet  Newly diagnosed type 2 diabetes Management as stated above. Follow-up with PCP within a week posthospitalization. Educated on insulin management while in the hospital At bedside personally counseled the patient on the importance of checking his blood sugar prior to administrating insulin.  Hypertension BP is stable and well controlled Continue metoprolol 25 mg twice daily Follow-up with PCP posthospitalization  Morbid obesity BMI 51 Recommend weight loss  Recommend regular physical activity and and healthy diet  Hypokalemia, resolved From 3.7 from potassium 3.3 yesterday Repleted with p.o. potassium 40 mEq  Hypomagnesemia Magnesium 1.7 from magnesium 1.5 yesterday Repleted with 2 g of IV magnesium   Procedures:  None  Consultations:  Diabetes coordinator  Discharge Exam: BP 136/67 (BP Location: Left Arm)   Pulse 94   Temp 97.7 F (36.5 C) (Oral)   Resp (!) 28   Ht 6' 1" (1.854 m)   Wt (!) 175.7 kg (387 lb 5.6 oz)   SpO2 99%   BMI 51.10 kg/m  . General: 28 y.o. year-old male morbidly obese in no acute distress.  Alert and oriented x3. . Cardiovascular: Regular rate and rhythm with no rubs or gallops.  No thyromegaly or JVD noted.   Marland Kitchen Respiratory: Clear to auscultation with no wheezes or rales. Good inspiratory effort. . Abdomen: Soft  nontender nondistended with normal bowel sounds x4 quadrants. . Musculoskeletal: No lower extremity edema. 2/4 pulses in all 4 extremities. . Skin: No ulcerative lesions noted or rashes . Psychiatry: Mood is appropriate for condition and setting  Discharge Instructions You were cared for by a hospitalist during your hospital stay. If you have any  questions about your discharge medications or the care you received while you were in the hospital after you are discharged, you can call the unit and asked to speak with the hospitalist on call if the hospitalist that took care of you is not available. Once you are discharged, your primary care physician will handle any further medical issues. Please note that NO REFILLS for any discharge medications will be authorized once you are discharged, as it is imperative that you return to your primary care physician (or establish a relationship with a primary care physician if you do not have one) for your aftercare needs so that they can reassess your need for medications and monitor your lab values.   Allergies as of 09/12/2017   No Known Allergies     Medication List    STOP taking these medications   IMODIUM PO     TAKE these medications   blood glucose meter kit and supplies Kit Dispense based on patient and insurance preference. Use up to four times daily as directed. (FOR ICD-9 250.00, 250.01).   insulin aspart 100 UNIT/ML injection Commonly known as:  novoLOG Inject 0-5 Units into the skin at bedtime.   insulin aspart 100 UNIT/ML injection Commonly known as:  novoLOG Inject 14 Units into the skin 3 (three) times daily with meals.   insulin detemir 100 UNIT/ML injection Commonly known as:  LEVEMIR Inject 0.32 mLs (32 Units total) into the skin 2 (two) times daily.   metoprolol tartrate 25 MG tablet Commonly known as:  LOPRESSOR Take 1 tablet (25 mg total) by mouth 2 (two) times daily.      No Known Allergies Follow-up Information    Pt, No Pcp Per .        [facility]. Call in 1 day(s).   Why:  Please call for an appointment. Contact information: [address] [place] [address] [phone]           The results of significant diagnostics from this hospitalization (including imaging, microbiology, ancillary and  laboratory) are listed below for reference.    Significant Diagnostic Studies: Dg Chest 2 View  Result Date: 09/07/2017 CLINICAL DATA:  Shortness of breath, dizziness and vomiting for the past 2 days. EXAM: CHEST - 2 VIEW COMPARISON:  12/27/2016 and chest CTA dated 12/28/2016 FINDINGS: Normal sized heart. Mild central peribronchial thickening. Clear lungs with normal vascularity. No pleural fluid. Normal appearing bones. IMPRESSION: Mild bronchitic changes. Electronically Signed   By: Steven  Reid M.D.   On: 09/07/2017 17:42    Microbiology: Recent Results (from the past 240 hour(s))  MRSA PCR Screening     Status: None   Collection Time: 09/08/17  2:19 AM  Result Value Ref Range Status   MRSA by PCR NEGATIVE NEGATIVE Final    Comment:        The GeneXpert MRSA Assay (FDA approved for NASAL specimens only), is one component of a comprehensive MRSA colonization surveillance program. It is not intended to diagnose MRSA infection nor to guide or monitor treatment for MRSA infections. Performed at [facility], [address] [place],  [address]        Labs: Basic Metabolic Panel: Recent Labs  Lab 09/08/17 0748  09/09/17 0318  09/09/17 1523 09/09/17 1944 09/10/17 0356 09/11/17 0523 09/12/17 0428  NA  --    < > 140   < > 140 143 140 138 137  K  --    < > 3.6   < > 4.0 3.9 3.1* 3.3* 3.7  CL  --    < > 108   < > 110 111 108 106 102  CO2  --    < > 18*   < > 16* 20* 22 22 26  GLUCOSE  --    < > 331*   < > 336* 309* 255* 239* 207*  BUN  --    < > 9   < > 9 9 8 7 6  CREATININE  --    < > 0.93   < > 0.83 0.87 0.77 0.62 0.67  CALCIUM  --    < > 8.8*   < > 8.8* 8.9 8.6* 8.2* 8.7*  MG 2.0  --  1.6*  --   --   --  1.7 1.5* 1.7  PHOS 1.8*  --  3.8  --   --   --  4.3 4.3  --    < > = values in this interval not displayed.   Liver Function Tests: Recent Labs  Lab 09/07/17 2007 09/09/17 0318 09/10/17 0356 09/11/17 0523  AST 20 19 16 21  ALT 36 27 23 22   ALKPHOS 132* 93 102 88  BILITOT 1.3* 1.3* 0.9 0.8  PROT 9.4* 7.7 7.5 6.6  ALBUMIN 4.0 3.2* 3.2* 2.7*   Recent Labs  Lab 09/07/17 2007  LIPASE 24   No results for input(s): AMMONIA in the last 168 hours. CBC: Recent Labs  Lab 09/07/17 1700  09/08/17 0546 09/09/17 0318 09/10/17 0356 09/11/17 0523 09/12/17 0428  WBC 9.9  --  13.7* 10.4 7.7 7.3 6.8  NEUTROABS 7.5  --   --  6.2 4.0 3.7  --   HGB 16.4   < > 15.7 13.6 13.3 12.1* 13.8  HCT 48.1   < > 45.8 40.5 39.9 36.5* 41.2  MCV 82.5  --  84.8 84.9 85.3 85.3 86.0  PLT 284  --  237 195 187 161 156   < > = values in this interval not displayed.   Cardiac Enzymes: No results for input(s): CKTOTAL, CKMB, CKMBINDEX, TROPONINI in the last 168 hours. BNP: BNP (last 3 results) No results for input(s): BNP in the last 8760 hours.  ProBNP (last 3 results) No results for input(s): PROBNP in the last 8760 hours.  CBG: Recent Labs  Lab 09/11/17 1139 09/11/17 1613 09/11/17 2044 09/12/17 0723 09/12/17 1144  GLUCAP 249* 205* 226* 197* 209*       Signed:   N , MD [facility] 09/12/2017, 1:58 PM  

## 2017-09-12 NOTE — Progress Notes (Signed)
09/12/17  1615  Reviewed discharge instructions with patient. Patient verbalized understanding of discharge instructions. Copy of discharge instructions and prescription given to patient.

## 2017-09-12 NOTE — Discharge Instructions (Signed)
Blood Glucose Monitoring, Adult Monitoring your blood sugar (glucose) helps you manage your diabetes. It also helps you and your health care provider determine how well your diabetes management plan is working. Blood glucose monitoring involves checking your blood glucose as often as directed, and keeping a record (log) of your results over time. Why should I monitor my blood glucose? Checking your blood glucose regularly can:  Help you understand how food, exercise, illnesses, and medicines affect your blood glucose.  Let you know what your blood glucose is at any time. You can quickly tell if you are having low blood glucose (hypoglycemia) or high blood glucose (hyperglycemia).  Help you and your health care provider adjust your medicines as needed.  When should I check my blood glucose? Follow instructions from your health care provider about how often to check your blood glucose. This may depend on:  The type of diabetes you have.  How well-controlled your diabetes is.  Medicines you are taking.  If you have type 1 diabetes:  Check your blood glucose at least 2 times a day.  Also check your blood glucose: ? Before every insulin injection. ? Before and after exercise. ? Between meals. ? 2 hours after a meal. ? Occasionally between 2:00 a.m. and 3:00 a.m., as directed. ? Before potentially dangerous tasks, like driving or using heavy machinery. ? At bedtime.  You may need to check your blood glucose more often, up to 6-10 times a day: ? If you use an insulin pump. ? If you need multiple daily injections (MDI). ? If your diabetes is not well-controlled. ? If you are ill. ? If you have a history of severe hypoglycemia. ? If you have a history of not knowing when your blood glucose is getting low (hypoglycemia unawareness). If you have type 2 diabetes:  If you take insulin or other diabetes medicines, check your blood glucose at least 2 times a day.  If you are on intensive  insulin therapy, check your blood glucose at least 4 times a day. Occasionally, you may also need to check between 2:00 a.m. and 3:00 a.m., as directed.  Also check your blood glucose: ? Before and after exercise. ? Before potentially dangerous tasks, like driving or using heavy machinery.  You may need to check your blood glucose more often if: ? Your medicine is being adjusted. ? Your diabetes is not well-controlled. ? You are ill. What is a blood glucose log?  A blood glucose log is a record of your blood glucose readings. It helps you and your health care provider: ? Look for patterns in your blood glucose over time. ? Adjust your diabetes management plan as needed.  Every time you check your blood glucose, write down your result and notes about things that may be affecting your blood glucose, such as your diet and exercise for the day.  Most glucose meters store a record of glucose readings in the meter. Some meters allow you to download your records to a computer. How do I check my blood glucose? Follow these steps to get accurate readings of your blood glucose: Supplies needed   Blood glucose meter.  Test strips for your meter. Each meter has its own strips. You must use the strips that come with your meter.  A needle to prick your finger (lancet). Do not use lancets more than once.  A device that holds the lancet (lancing device).  A journal or log book to write down your results. Procedure  Wash your hands with soap and water.  Prick the side of your finger (not the tip) with the lancet. Use a different finger each time.  Gently rub the finger until a small drop of blood appears.  Follow instructions that come with your meter for inserting the test strip, applying blood to the strip, and using your blood glucose meter.  Write down your result and any notes. Alternative testing sites  Some meters allow you to use areas of your body other than your finger  (alternative sites) to test your blood.  If you think you may have hypoglycemia, or if you have hypoglycemia unawareness, do not use alternative sites. Use your finger instead.  Alternative sites may not be as accurate as the fingers, because blood flow is slower in these areas. This means that the result you get may be delayed, and it may be different from the result that you would get from your finger.  The most common alternative sites are: ? Forearm. ? Thigh. ? Palm of the hand. Additional tips  Always keep your supplies with you.  If you have questions or need help, all blood glucose meters have a 24-hour hotline number that you can call. You may also contact your health care provider.  After you use a few boxes of test strips, adjust (calibrate) your blood glucose meter by following instructions that came with your meter. This information is not intended to replace advice given to you by your health care provider. Make sure you discuss any questions you have with your health care provider. Document Released: 03/08/2003 Document Revised: 09/23/2015 Document Reviewed: 08/15/2015 Elsevier Interactive Patient Education  2017 [employer] With Diabetes Diabetes (type 1 diabetes mellitus or type 2 diabetes mellitus) is a condition in which the body does not have enough of a hormone called insulin, or the body does not respond properly to insulin. Normally, insulin allows sugars (glucose) to enter cells in the body. The cells use glucose for energy. With diabetes, extra glucose builds up in the blood instead of going into cells, which results in high blood glucose (hyperglycemia). How to manage lifestyle changes Managing diabetes includes medical treatments as well as lifestyle changes. If diabetes is not managed well, serious physical and emotional complications can occur. Taking good care of yourself means that you are responsible for:  Monitoring glucose regularly.  Eating  a healthy diet.  Exercising regularly.  Meeting with health care providers.  Taking medicines as directed.  Some people may feel a lot of stress about managing their diabetes. This is known as emotional distress, and it is very common. Living with diabetes can place you at risk for emotional distress, depression, or anxiety. These disorders can be confusing and can make diabetes management more difficult. How to recognize stress Emotional distress Symptoms of emotional distress include:  Anger about having a diagnosis of diabetes.  Fear or frustration about your diagnosis and the changes you need to make to manage the condition.  Being overly worried about the care that you need or the cost of the care you need.  Feeling like you caused your condition by doing something wrong.  Fear of unpredictable situations, like low or high blood glucose.  Feeling judged by your health care providers.  Feeling very alone with the disease.  Getting too tired or "burned out" with the demands of daily care.  Depression Having diabetes means that you are at a higher risk for depression. Having depression also means  that you are at a higher risk for diabetes. Your health care provider may test (screen) you for symptoms of depression. It is important to recognize depression symptoms and to start treatment for it soon after it is diagnosed. The following are some symptoms of depression:  Loss of interest in things that you used to enjoy.  Trouble sleeping, or often waking up early and not being able to get back to sleep.  A change in appetite.  Feeling tired most of the day.  Feeling nervous and anxious.  Feeling guilty and worrying that you are a burden to others.  Feeling depressed more often than you do not feel that way.  Thoughts of hurting yourself or feeling that you want to die.  If you have any of these symptoms for 2 weeks or longer, reach out to a health care provider. Where  to find support  Ask your health care provider to recommend a therapist who understands both depression and diabetes.  Search for information and support from the American Diabetes Association: www.diabetes.org  Find a certified diabetes educator and make an appointment through [employer]: www.diabeteseducator.org Follow these instructions at home: Managing emotional distress The following are some ways to manage emotional distress:  Talk with your health care provider or certified diabetes educator. Consider working with a counselor or therapist.  Learn as much as you can about diabetes and its treatment. Meet with a certified diabetes educator or take a class to learn how to manage your condition.  Keep a journal of your thoughts and concerns.  Accept that some things are out of your control.  Talk with other people who have diabetes. It can help to talk with others about the emotional distress that you feel.  Find ways to manage stress that work for you. These may include art or music therapy, exercise, meditation, and hobbies.  Seek support from spiritual leaders, family, and friends.  General instructions  Follow your diabetes management plan.  Keep all follow-up visits as told by your health care provider. This is important. Get help right away if:  You have thoughts about hurting yourself or others. If you ever feel like you may hurt yourself or others, or have thoughts about taking your own life, get help right away. You can go to your nearest emergency department or call:  Your local emergency services (911 in the [place]  A suicide crisis helpline, such as the [facility] at (309)690-2980. This is open 24 hours a day.  Summary  Diabetes (type 1 diabetes mellitus or type 2 diabetes mellitus) is a condition in which the body does not have enough of a hormone called insulin, or the body does not respond properly  to insulin.  Living with diabetes puts you at risk for medical issues, and it also puts you at risk for emotional issues such as emotional distress, depression, and anxiety.  Recognizing the symptoms of emotional distress and depression may help you avoid problems with your diabetes control. It is important to start treatment for emotional distress and depression soon after they are diagnosed.  Having diabetes means that you are at a higher risk for depression. Ask your health care provider to recommend a therapist who understands both depression and diabetes.  If you experience symptoms of emotional distress or depression, it is important to discuss this with your health care provider, certified diabetes educator, or therapist. This information is not intended to replace advice given to you by  your health care provider. Make sure you discuss any questions you have with your health care provider. Document Released: 07/19/2016 Document Revised: 07/19/2016 Document Reviewed: 07/19/2016 Elsevier Interactive Patient Education  2018 [employer].   Correction Insulin Correction insulin, also called corrective insulin or a supplemental dose, is a small amount of insulin that can be used to lower your blood sugar (glucose) if it is too high. You may be instructed to check your blood glucose at certain times of the day and to use correction insulin as needed to lower your blood glucose to your target range. Correction insulin is primarily used as part of diabetes management. It may also be prescribed for people who do not have diabetes. What is a correction scale? A correction scale, also called a sliding scale, is prescribed by your health care provider to help you determine when you need correction insulin. Your correction scale is based on your individual treatment goals, and it has two parts:  Ranges of blood glucose levels.  How much correction insulin to give yourself if your blood sugar falls  within a certain range.  If your blood glucose is in your desired range, you will not need correction insulin and you should take your normal insulin dose. What type of insulin do I need? Your health care provider may prescribe rapid-acting or short-acting insulin for you to use as correction insulin. Rapid-acting insulin:  Starts working quickly, in as little as 5 minutes.  Can last for 3-6 hours.  Works well when taken right before a meal to quickly lower blood glucose. Short-acting insulin:  Starts working in about 30 minutes.  Can last for 6-8 hours.  Should be taken about 30 minutes before you start eating a meal. Talk with your health care provider or pharmacist about which type of correction insulin to take and when to take it. If you use insulin to control your diabetes, you should use correction insulin in addition to the longer-acting (basal) insulin that you normally use. How do I manage my blood glucose with correction insulin? Giving a correction dose  Check your blood glucose as directed by your health care provider.  Use your correction scale to find the range that your blood glucose is in.  Identify the units of insulin that match your blood glucose range.  Make sure you have food available that you can eat in the next 15-30 minutes, after your correction dose.  Give yourself the dose of correction insulin that your health care provider has prescribed in your correction scale. Always make sure you are using the right type of insulin. ? If your correction insulin is rapid-acting, start eating a meal within 15 minutes after your correction dose to keep your blood glucose from getting too low. ? If your correction insulin is short-acting, start eating a meal within 30 minutes after your correction dose to keep your blood glucose from getting too low. Keeping a blood glucose log  Write down your blood glucose test results and the amount of insulin that you give  yourself. Do this every time you check blood glucose or take insulin. Bring this log with you to your medical visits. This information will help your health care provider to manage your medicines.  Note anything that may affect your blood glucose, such as: ? Changes in normal exercise or activity. ? Changes in your normal schedule, such as changes in your sleep routine, going on vacation, changing your diet, or holidays. ? New over-the-counter or prescription medicines. ?  Illness, stress, or anxiety. ? Changes in the time that you took your medicine or insulin. ? Changes in your meals, such as skipping a meal, having a late meal, or dining out. ? Eating things that may affect blood glucose, such as snacks, meal portions that are larger than normal, drinks that contain sugar, or eating less than usual. What do I need to know about hyperglycemia and hypoglycemia? What is hyperglycemia? Hyperglycemia, also called high blood glucose, occurs when blood glucose is too high. Make sure you know the early signs of hyperglycemia, such as:  Increased thirst.  Hunger.  Feeling very tired.  Needing to urinate more often than usual.  Blurry vision.  What is hypoglycemia? Hypoglycemia is also called low blood glucose. Be aware of stacking your insulin doses. This happens when you correct a high blood glucose by giving yourself extra insulin too soon after a previous correction dose or mealtime dose. This may cause you to have too much insulin in your body and may put you at risk for hypoglycemia. Hypoglycemia occurs with a blood glucose level at or below 70 mg/dL (3.9 mmol/L). It is important to know the symptoms of hypoglycemia and treat it right away. Always have a 15-gram rapid-acting carbohydrate snack with you to treat low blood glucose. Family members and close friends should also know the symptoms and should understand how to treat hypoglycemia, in case you are not able to treat yourself. What  are the symptoms of hypoglycemia? Hypoglycemia symptoms can include:  Hunger.  Anxiety.  Sweating and feeling clammy.  Confusion.  Dizziness or light-headedness.  Sleepiness.  Nausea.  Increased heart rate.  Headache.  Blurry vision.  Jerky movements that you cannot control (seizure).  Nightmares.  Tingling or numbness around the mouth, lips, or tongue.  A change in speech.  Decreased ability to concentrate.  A change in coordination.  Restless sleep.  Tremors or shakes.  Fainting.  Irritability.  How do I treat hypoglycemia? If you are alert and able to swallow safely, follow the 15:15 rule:  Take 15 grams of a rapid-acting carbohydrate. Rapid-acting options include: ? 1 tube of glucose gel. ? 3 glucose pills. ? 6-8 pieces of hard candy. ? 4 oz (120 mL) of fruit juice. ? 4 oz (120 mL) of regular (not diet) soda.  Check your blood glucose 15 minutes after you take the carbohydrate. ? If the repeat blood glucose level is still at or below 70 mg/dL (3.9 mmol/L), take 15 grams of a carbohydrate again. ? If your blood glucose level does not increase above 70 mg/dL (3.9 mmol/L) after 3 tries, seek emergency medical care.  After your blood glucose level returns to normal, eat a meal or a snack within 1 hour.  How do I treat severe hypoglycemia? Severe hypoglycemia is when your blood glucose level is at or below 54 mg/dL (3 mmol/L). Severe hypoglycemia is an emergency. Do not wait to see if the symptoms will go away. Get medical help right away. Call your local emergency services (911 in the [place] Do not drive yourself to the hospital. If you have severe hypoglycemia and you cannot eat or drink, you may need an injection of glucagon. A family member or close friend should learn how to check your blood glucose and how to give you a glucagon injection. Ask your health care provider if you need to have an emergency glucagon injection kit available. Severe  hypoglycemia may need to be treated in a hospital. The treatment may  include getting glucose through an IV tube. You may also need treatment for the cause of your hypoglycemia. Why do I need correction insulin if I do not have diabetes? If you do not have diabetes, your health care provider may prescribe insulin because:  Keeping your blood glucose in the target range is important for your overall health.  You are taking medicines that cause your blood glucose to be higher than normal.  Contact a health care provider if:  You develop low blood glucose that you are not able to treat yourself.  You have high blood glucose that you are not able to correct with correction insulin.  Your blood glucose is often too low.  You used emergency glucagon to treat low blood glucose. Get help right away if:  You become unresponsive. If this happens, someone else should call emergency services (911 in the [place] right away.  Your blood glucose is lower than 54 mg/dL (3.0 mmol/L).  You become confused or you have trouble thinking clearly.  You have difficulty breathing. Summary  Correction insulin is a small amount of insulin that can be used to lower your blood sugar (glucose) if it is too high.  Talk with your health care provider or pharmacist about which type of correction insulin to take and when to take it. If you use insulin to control your diabetes, you should use correction insulin in addition to the longer-acting (basal) insulin that you normally use.  You may be instructed to check your blood glucose at certain times of the day and to use correction insulin as needed to lower your blood glucose to your target range. Always keep a log of your blood glucose values and the amount of insulin that you used.  It is important to know the symptoms of hypoglycemia and treat it right away. Always have a 15-gram rapid-acting carbohydrate snack with you to treat low blood glucose. Family members and  close friends should also know the symptoms and should understand how to treat hypoglycemia, in case you are not able to treat yourself. This information is not intended to replace advice given to you by your health care provider. Make sure you discuss any questions you have with your health care provider. Document Released: 07/27/2010 Document Revised: 12/02/2015 Document Reviewed: 12/02/2015 Elsevier Interactive Patient Education  2018 [employer].   Diabetic Ketoacidosis Diabetic ketoacidosis is a life-threatening complication of diabetes. If it is not treated, it can cause severe dehydration and organ damage and can lead to a coma or death. What are the causes? This condition develops when there is not enough of the hormone insulin in the body. Insulin helps the body to break down sugar for energy. Without insulin, the body cannot break down sugar, so it breaks down fats instead. This leads to the production of acids that are called ketones. Ketones are poisonous at high levels. This condition can be triggered by:  Stress on the body that is brought on by an illness.  Medicines that raise blood glucose levels.  Not taking diabetes medicine.  What are the signs or symptoms? Symptoms of this condition include:  Fatigue.  Weight loss.  Excessive thirst.  Light-headedness.  Fruity or sweet-smelling breath.  Excessive urination.  Vision changes.  Confusion or irritability.  Nausea.  Vomiting.  Rapid breathing.  Abdominal pain.  Feeling flushed.  How is this diagnosed? This condition is diagnosed based on a medical history, a physical exam, and blood tests. You may also have a urine  test that checks for ketones. How is this treated? This condition may be treated with:  Fluid replacement. This may be done to correct dehydration.  Insulin injections. These may be given through the skin or through an IV tube.  Electrolyte replacement. Electrolytes, such as  potassium and sodium, may be given in pill form or through an IV tube.  Antibiotic medicines. These may be prescribed if your condition was caused by an infection.  Follow these instructions at home: Eating and drinking  Drink enough fluids to keep your urine clear or pale yellow.  If you cannot eat, alternate between drinking fluids with sugar (such as juice) and salty fluids (such as broth or bouillon).  If you can eat, follow your usual diet and drink sugar-free liquids, such as water. Other Instructions   Take insulin as directed by your health care provider. Do not skip insulin injections. Do not use expired insulin.  If your blood sugar is over 240 mg/dL, monitor your urine ketones every 4-6 hours.  If you were prescribed an antibiotic medicine, finish all of it even if you start to feel better.  Rest and exercise only as directed by your health care provider.  If you get sick, call your health care provider and begin treatment quickly. Your body often needs extra insulin to fight an illness.  Check your blood glucose levels regularly. If your blood glucose is high, drink plenty of fluids. This helps to flush out ketones. Contact a health care provider if:  Your blood glucose level is too high or too low.  You have ketones in your urine.  You have a fever.  You cannot eat.  You cannot tolerate fluids.  You have been vomiting for more than 2 hours.  You continue to have symptoms of this condition.  You develop new symptoms. Get help right away if:  Your blood glucose levels continue to be high (elevated).  Your monitor reads high even when you are taking insulin.  You faint.  You have chest pain.  You have trouble breathing.  You have a sudden, severe headache.  You have sudden weakness in one arm or one leg.  You have sudden trouble speaking or swallowing.  You have vomiting or diarrhea that gets worse after 3 hours.  You feel severely  fatigued.  You have trouble thinking.  You have abdominal pain.  You are severely dehydrated. Symptoms of severe dehydration include: ? Extreme thirst. ? Dry mouth. ? Blue lips. ? Cold hands and feet. ? Rapid breathing. This information is not intended to replace advice given to you by your health care provider. Make sure you discuss any questions you have with your health care provider. Document Released: 03/02/2000 Document Revised: 08/11/2015 Document Reviewed: 02/10/2014 Elsevier Interactive Patient Education  2017 [employer].   How to Avoid Diabetes Mellitus Problems You can take action to prevent or slow down problems that are caused by diabetes (diabetes mellitus). Following your diabetes plan and taking care of yourself can reduce your risk of serious or life-threatening complications. Manage your diabetes  Follow instructions from your health care providers about managing your diabetes. Your diabetes may be managed by a team of health care providers who can teach you how to care for yourself and can answer questions that you have.  Educate yourself about your condition so you can make healthy choices about eating and physical activity.  Check your blood sugar (glucose) levels as often as directed. Your health care provider will help  you decide how often to check your blood glucose level depending on your treatment goals and how well you are meeting them.  Ask your health care provider if you should take low-dose aspirin daily and what dose is recommended for you. Taking low-dose aspirin daily is recommended to help prevent cardiovascular disease. Do not use nicotine or tobacco Do not use any products that contain nicotine or tobacco, such as cigarettes and e-cigarettes. If you need help quitting, ask your health care provider. Nicotine raises your risk for diabetes problems. If you quit using nicotine:  You will lower your risk for heart attack, stroke, nerve disease, and  kidney disease.  Your cholesterol and blood pressure may improve.  Your blood circulation will improve.  Keep your blood pressure under control To control your blood pressure:  Follow instructions from your health care provider about meal planning, exercise, and medicines.  Make sure your health care provider checks your blood pressure at every medical visit.  A blood pressure reading consists of two numbers. Generally, the goal is to keep your top number (systolic pressure) at or below 130, and your bottom number (diastolic pressure) at or below 80. Your health care provider may recommend a lower target blood pressure. Your individualized target blood pressure is determined based on:  Your age.  Your medicines.  How long you have had diabetes.  Any other medical conditions you have.  Keep your cholesterol under control To control your cholesterol:  Follow instructions from your health care provider about meal planning, exercise, and medicines.  Have your cholesterol checked at least once a year.  You may be prescribed medicine to lower cholesterol (statin). If you are not taking a statin, ask your health care provider if you should be.  Controlling your cholesterol may:  Help prevent heart disease and stroke. These are the most common health problems for people with diabetes.  Improve your blood flow.  Schedule and keep yearly physical exams and eye exams Your health care provider will tell you how often you need medical visits depending on your diabetes management plan. Keep all follow-up visits as directed. This is important so possible problems can be identified early and complications can be avoided or treated.  Every visit with your health care provider should include measuring your: ? Weight. ? Blood pressure. ? Blood glucose control.  Your A1c (hemoglobin A1c) level should be checked: ? At least 2 times a year, if you are meeting your treatment goals. ? 4 times  a year, if you are not meeting treatment goals or if your treatment goals have changed.  Your blood lipids (lipid profile) should be checked yearly. You should also be checked yearly for protein in your urine (urine microalbumin).  If you have type 1 diabetes, get an eye exam 3-5 years after you are diagnosed, and then once a year after your first exam.  If you have type 2 diabetes, get an eye exam as soon as you are diagnosed, and then once a year after your first exam.  Keep your vaccines current It is recommended that you receive:  A flu (influenza) vaccine every year.  A pneumonia (pneumococcal) vaccine and a hepatitis B vaccine. If you are age 32 or older, you may get the pneumonia vaccine as a series of two separate shots.  Ask your health care provider which other vaccines may be recommended. Take care of your feet Diabetes may cause you to have poor blood circulation to your legs and feet. Because  of this, taking care of your feet is very important. Diabetes can cause:  The skin on the feet to get thinner, break more easily, and heal more slowly.  Nerve damage in your legs and feet, which results in decreased feeling. You may not notice minor injuries that could lead to serious problems.  To avoid foot problems:  Check your skin and feet every day for cuts, bruises, redness, blisters, or sores.  Schedule a foot exam with your health care provider once every year. This exam includes: ? Inspecting of the structure and skin of your feet. ? Checking the pulses and sensation in your feet.  Make sure that your health care provider performs a visual foot exam at every medical visit.  Take care of your teeth People with poorly controlled diabetes are more likely to have gum (periodontal) disease. Diabetes can make periodontal diseases harder to control. If not treated, periodontal diseases can lead to tooth loss. To prevent this:  Brush your teeth twice a day.  Floss at least  once a day.  Visit your dentist 2 times a year.  Drink responsibly Limit alcohol intake to no more than 1 drink a day for nonpregnant women and 2 drinks a day for men. One drink equals 12 oz of beer, 5 oz of wine, or 1 oz of hard liquor. It is important to eat food when you drink alcohol to avoid low blood glucose (hypoglycemia). Avoid alcohol if you:  Have a history of alcohol abuse or dependence.  Are pregnant.  Have liver disease, pancreatitis, advanced neuropathy, or severe hypertriglyceridemia.  Lessen stress Living with diabetes can be stressful. When you are experiencing stress, your blood glucose may be affected in two ways:  Stress hormones may cause your blood glucose to rise.  You may be distracted from taking good care of yourself.  Be aware of your stress level and make changes to help you manage challenging situations. To lower your stress levels:  Consider joining a support group.  Do planned relaxation or meditation.  Do a hobby that you enjoy.  Maintain healthy relationships.  Exercise regularly.  Work with your health care provider or a mental health professional.  Summary  You can take action to prevent or slow down problems that are caused by diabetes (diabetes mellitus). Following your diabetes plan and taking care of yourself can reduce your risk of serious or life-threatening complications.  Follow instructions from your health care providers about managing your diabetes. Your diabetes may be managed by a team of health care providers who can teach you how to care for yourself and can answer questions that you have.  Your health care provider will tell you how often you need medical visits depending on your diabetes management plan. Keep all follow-up visits as directed. This is important so possible problems can be identified early and complications can be avoided or treated. This information is not intended to replace advice given to you by your  health care provider. Make sure you discuss any questions you have with your health care provider. Document Released: 11/21/2010 Document Revised: 12/03/2015 Document Reviewed: 12/03/2015 Elsevier Interactive Patient Education  2018 [place] for Eating Away From Home If You Have Diabetes Controlling your level of blood glucose, also known as blood sugar, can be challenging. It can be even more difficult when you do not prepare your own meals. The following tips can help you manage your diabetes when you eat away from home. Planning ahead  Plan ahead if you know you will be eating away from home:  Ask your health care provider how to time meals and medicine if you are taking insulin.  Make a list of restaurants near you that offer healthy choices. If they have a carry-out menu, take it home and plan what you will order ahead of time.  Look up the restaurant you want to eat at online. Many chain and fast-food restaurants list nutritional information online. Use this information to choose the healthiest options and to calculate how many carbohydrates will be in your meal.  Use a carbohydrate-counting book or mobile app to look up the carbohydrate content and serving size of the foods you want to eat.  Become familiar with serving sizes and learn to recognize how many servings are in a portion. This will allow you to estimate how many carbohydrates you can eat.  Free foods A "free food" is any food or drink that has less than 5 g of carbohydrates per serving. Free foods include:  Many vegetables.  Hard boiled eggs.  Nuts or seeds.  Olives.  Cheeses.  Meats.  These types of foods make good appetizer choices and are often available at salad bars. Lemon juice, vinegar, or a low-calorie salad dressing of fewer than 20 calories per serving can be used as a "free" salad dressing. Choices to reduce carbohydrates  Substitute nonfat sweetened yogurt with a sugar-free yogurt.  Yogurt made from soy milk may also be used, but you will still want a sugar-free or plain option to choose a lower carbohydrate amount.  Ask your server to take away the bread basket or chips from your table.  Order fresh fruit. A salad bar often offers fresh fruit choices. Avoid canned fruit because it is usually packed in sugar or syrup.  Order a salad, and eat it without dressing. Or, create a "free" salad dressing.  Ask for substitutions. For example, instead of [place] fries, request an order of a vegetable such as salad, green beans, or broccoli. Other tips  If you take insulin, take the insulin once your food arrives to your table. This will ensure your insulin and food are timed correctly.  Ask your server about the portion size before your order, and ask for a take-out box if the portion has more servings than you should have. When your food comes, leave the amount you should have on the plate, and put the rest in the take-out box.  Consider splitting an entree with someone and ordering a side salad. This information is not intended to replace advice given to you by your health care provider. Make sure you discuss any questions you have with your health care provider. Document Released: 03/05/2005 Document Revised: 08/11/2015 Document Reviewed: 06/02/2013 Elsevier Interactive Patient Education  2018 [employer].   Insulin Injection Instructions, Using Insulin Pens, Adult A subcutaneous injection is a shot of medicine that is injected into the layer of fat between skin and muscle. People with type 1 diabetes must take insulin because their bodies do not make it. People with type 2 diabetes may need to take insulin. There are many different types of insulin. The type of insulin that you take may determine how many injections you give yourself and when you need to take the injections. Choosing a site for injection Insulin absorption varies from site to site. It is best to inject  insulin within the same body area, using a different spot in that area for each injection. Do not  inject the insulin in the same spot for each injection. There are five main areas that can be used for injecting. These areas include:  Abdomen. This is the preferred area.  Front of thigh.  Upper, outer side of thigh.  Back of upper arm.  Buttocks.  Using an insulin pen First, follow the steps for Getting Ready, then continue with the steps for Injecting the Insulin. Getting Ready 1. Wash your hands with soap and water. If soap and water are not available, use hand sanitizer. 2. Check the expiration date and type of insulin in the pen. 3. If you are using CLEAR insulin, check to see that it is clear and free of clumps. 4. If you are using CLOUDY insulin, gently roll the pen between your palms several times, or tip the pen up and down several times to mix up the medicine. Do not shake the pen. 5. Remove the cap from the insulin pen. 6. Use an alcohol wipe to clean the rubber stopper of the pen cartridge. 7. Remove the protective paper tab from the disposable needle. Do not let the needle touch anything. 8. Screw the needle onto the pen. 9. Remove the outer and inner plastic covers from the needle. Do not throw away the outer plastic cover yet. 10. Prime the insulin pen by turning the button (dial) to 2 units. Hold the pen with the needle pointing up, and push the button on the opposite end of the pen until a drop of insulin appears at the needle tip. If no insulin appears, repeat this step. 11. Dial the number of units of insulin that you will be injecting. Injecting the Insulin  1. Use an alcohol wipe to clean the site where you will be injecting the needle. Let the site air-dry. 2. Hold the pen in the palm of your writing hand with your thumb on the top. 3. If directed by your health care provider, use your other hand to pinch and hold about an inch of skin at the injection site. Do not  directly touch the cleaned part of the skin. 4. Gently but quickly, put the needle straight into the skin. The needle should be at a 90-degree angle (perpendicular) to the skin, as if to form the letter "L." ? For example, if you are giving an injection in the abdomen, the abdomen forms one "leg" of the "L" and the needle forms the other "leg" of the "L." 5. For adults who have a small amount of body fat, the needle may need to be injected at a 45-degree angle instead. Your health care provider will tell you if this is necessary. ? A 45-degree angle looks like the letter "V." 6. When the needle is completely inserted into the skin, use the thumb of your writing hand to push the top button of the pen down all the way to inject the insulin. 7. Let go of the skin that you are pinching. Continue to hold the pen in place with your writing hand. 8. Wait five seconds, then pull the needle straight out of the skin. 9. Carefully put the larger (outer) plastic cover of the needle back over the needle, then unscrew the capped needle and discard it in a sharps container, such as an empty plastic bottle with a cover. 10. Put the plastic cap back on the insulin pen. Throwing away supplies  Discard all used needles in a puncture-proof sharps disposal container. You can ask your local pharmacy about where you can get  this kind of disposal container, or you can use an empty liquid laundry detergent bottle that has a cover.  Follow the disposal regulations for the area where you live. Do not use any needle more than one time.  Throw away empty disposable pens in the regular trash. What questions should I ask my health care provider?  How often should I be taking insulin?  How often should I check my blood glucose?  What amount of insulin should I be taking at each time?  What are the side effects?  What should I do if my blood glucose is too high?  What should I do if my blood glucose is too low?  What  should I do if I forget to take my insulin?  What number should I call if I have questions? Where can I get more information?  American Diabetes Association (ADA): www.diabetes.org  American Association of Diabetes Educators (AADE) Patient Resources: https://www.diabeteseducator.org/patient-resources This information is not intended to replace advice given to you by your health care provider. Make sure you discuss any questions you have with your health care provider. Document Released: 04/08/2015 Document Revised: 08/11/2015 Document Reviewed: 04/08/2015 Elsevier Interactive Patient Education  [employer].

## 2017-09-13 MED FILL — TRUEplus LANCETS 28G MISC: 25 days supply | Qty: 100 | Fill #0

## 2017-09-13 MED FILL — TRUE METRIX TEST STRIP: 25 days supply | Qty: 100 | Fill #0

## 2017-09-13 MED FILL — !TRUE METRIX BLOOD GLUCOSE: 365 days supply | Qty: 1 | Fill #0

## 2017-11-29 ENCOUNTER — Emergency Department (HOSPITAL_COMMUNITY): Payer: Self-pay

## 2017-11-29 ENCOUNTER — Encounter (HOSPITAL_COMMUNITY): Payer: Self-pay | Admitting: Emergency Medicine

## 2017-11-29 ENCOUNTER — Other Ambulatory Visit: Payer: Self-pay

## 2017-11-29 ENCOUNTER — Emergency Department (HOSPITAL_COMMUNITY)
Admission: EM | Admit: 2017-11-29 | Discharge: 2017-11-29 | Disposition: A | Discharge status: Home / Self Care | Payer: Self-pay | Attending: Emergency Medicine | Admitting: Emergency Medicine

## 2017-11-29 DIAGNOSIS — Z794 Long term (current) use of insulin: Secondary | ICD-10-CM | POA: Insufficient documentation

## 2017-11-29 DIAGNOSIS — E119 Type 2 diabetes mellitus without complications: Secondary | ICD-10-CM | POA: Insufficient documentation

## 2017-11-29 DIAGNOSIS — R202 Paresthesia of skin: Secondary | ICD-10-CM | POA: Insufficient documentation

## 2017-11-29 HISTORY — DX: Type 2 diabetes mellitus without complications: E11.9

## 2017-11-29 LAB — CBC WITH DIFFERENTIAL/PLATELET
ABS IMMATURE GRANULOCYTES: 0.1 10*3/uL (ref 0.0–0.1)
Basophils Absolute: 0 10*3/uL (ref 0.0–0.1)
Basophils Relative: 0 %
EOS PCT: 2 %
Eosinophils Absolute: 0.2 10*3/uL (ref 0.0–0.7)
HEMATOCRIT: 38.8 % — AB (ref 39.0–52.0)
HEMOGLOBIN: 12.2 g/dL — AB (ref 13.0–17.0)
Immature Granulocytes: 1 %
LYMPHS ABS: 2.3 10*3/uL (ref 0.7–4.0)
Lymphocytes Relative: 23 %
MCH: 28.1 pg (ref 26.0–34.0)
MCHC: 31.4 g/dL (ref 30.0–36.0)
MCV: 89.4 fL (ref 78.0–100.0)
MONO ABS: 0.7 10*3/uL (ref 0.1–1.0)
Monocytes Relative: 8 %
NEUTROS ABS: 6.5 10*3/uL (ref 1.7–7.7)
Neutrophils Relative %: 66 %
Platelets: 303 10*3/uL (ref 150–400)
RBC: 4.34 MIL/uL (ref 4.22–5.81)
RDW: 12.8 % (ref 11.5–15.5)
WBC: 9.8 10*3/uL (ref 4.0–10.5)

## 2017-11-29 LAB — COMPREHENSIVE METABOLIC PANEL
ALBUMIN: 3.4 g/dL — AB (ref 3.5–5.0)
ALK PHOS: 84 U/L (ref 38–126)
ALT: 38 U/L (ref 0–44)
ANION GAP: 11 (ref 5–15)
AST: 24 U/L (ref 15–41)
BILIRUBIN TOTAL: 0.4 mg/dL (ref 0.3–1.2)
BUN: 9 mg/dL (ref 6–20)
CO2: 23 mmol/L (ref 22–32)
CREATININE: 0.79 mg/dL (ref 0.61–1.24)
Calcium: 9 mg/dL (ref 8.9–10.3)
Chloride: 105 mmol/L (ref 98–111)
GFR calc Af Amer: >60 mL/min (ref >60)
GFR calc non Af Amer: >60 mL/min (ref >60)
GLUCOSE: 119 mg/dL — AB (ref 70–99)
Potassium: 4 mmol/L (ref 3.5–5.1)
SODIUM: 139 mmol/L (ref 135–145)
TOTAL PROTEIN: 7.8 g/dL (ref 6.5–8.1)

## 2017-11-29 LAB — TSH: TSH: 1.275 u[IU]/mL (ref 0.350–4.500)

## 2017-11-29 LAB — MAGNESIUM: Magnesium: 1.9 mg/dL (ref 1.7–2.4)

## 2017-11-29 LAB — PHOSPHORUS: PHOSPHORUS: 2.9 mg/dL (ref 2.5–4.6)

## 2017-11-29 NOTE — Discharge Instructions (Addendum)
You were seen today for evaluation of hand numbness and tingling.  Your lab work was negative. The Mri showed you might have a possible Chiari Malformation. Please follow-up with your Primary care provider. Please return to the ED with any new or worsening symptoms such as:  Contact a health care provider if: You have new signs or symptoms of peripheral neuropathy. You are struggling emotionally from dealing with peripheral neuropathy. You have a fever. Get help right away if: You have an injury or infection that is not healing. You feel very dizzy or begin vomiting. You have chest pain. You have trouble breathing. Sudden headache Worst headache of your life.

## 2017-11-29 NOTE — ED Provider Notes (Signed)
[place] EMERGENCY DEPARTMENT Provider Note   CSN: [id number] Arrival date & time: 11/29/17  0606  History   Chief Complaint Chief Complaint  Patient presents with  . Numbness    HPI Dylan Watts is a 27 y.o. male with a past medical history significant for diabetes who presents for evaluation of bilateral tingling in hands. States this began 1 week ago. Denies hx trauma to head,neck or upper extremities. States he thought hypoglycemia was the cause however his blood sugars have been running 100-150s at home. Denies fever, chills, headache, neck pain, neck stiffness, chest pain, SOB, weakness, abdominal pain, facial asymmetry, unilateral weakness. Denies upper extremity pain, decreased ROM or decreased strength to the upper extremities. Diagnosed with DM2 on June 2019 hospital admission. Has not followed up with PCP for re-evalaution since dc. States he has been taking his insulin and checking his blood sugars regularly. Admits to an episodes of mild lightheadedness with movement at work yesterday evening. States he has not been drinking water like he should.    Past Medical History:  Diagnosis Date  . Diabetes mellitus without complication [facility])     Patient Active Problem List   Diagnosis Date Noted  . Obesity, Class III, BMI 40-49.9 (morbid obesity) ([place]) 09/08/2017  . Leukocytosis 09/08/2017  . AKI (acute kidney injury) ([place]) 09/08/2017  . Hypokalemia 09/08/2017  . Hypophosphatemia 09/08/2017  . Diabetic ketoacidosis ([place]) 09/07/2017    History reviewed. No pertinent surgical history.     Home Medications    Prior to Admission medications   Medication Sig Start Date End Date Taking? Authorizing Provider  Aspirin-Salicylamide-Caffeine (BC HEADACHE POWDER PO) Take 1 Package by mouth as needed (headache).   Yes [provider]  insulin aspart (NOVOLOG) 100 UNIT/ML injection Inject 14 Units into the skin 3 (three) times daily with meals. Patient  taking differently: Inject 14 Units into the skin 2 (two) times daily.  09/12/17  Yes Hall, Carole N, DO  insulin detemir (LEVEMIR) 100 UNIT/ML injection Inject 0.32 mLs (32 Units total) into the skin 2 (two) times daily. 09/12/17  Yes Hall, Carole N, DO  blood glucose meter kit and supplies KIT Dispense based on patient and insurance preference. Use up to four times daily as directed. (FOR ICD-9 250.00, 250.01). 09/12/17   Kayleen Memos, DO  insulin aspart (NOVOLOG) 100 UNIT/ML injection Inject 0-5 Units into the skin at bedtime. Patient not taking: Reported on 11/29/2017 09/12/17   Kayleen Memos, DO  metoprolol tartrate (LOPRESSOR) 25 MG tablet Take 1 tablet (25 mg total) by mouth 2 (two) times daily. Patient not taking: Reported on 11/29/2017 09/12/17   Kayleen Memos, DO    Family History Family History  Problem Relation Age of Onset  . Diabetes Mother   . Diabetes Father     Social History Social History   Tobacco Use  . Smoking status: Never Smoker  . Smokeless tobacco: Never Used  Substance Use Topics  . Alcohol use: Yes    Comment: socially  . Drug use: No     Allergies   Patient has no known allergies.   Review of Systems Review of Systems Review of systems negative unless otherwise stated in the HPI.  Physical Exam Updated Vital Signs BP 124/61 Comment: Simultaneous filing. User may not have seen previous data.  Pulse 84   Temp 97.6 F (36.4 C)   Resp 19   Ht '6\' 1"'$  (1.854 m)   Wt (!) 172.4 kg  SpO2 98%   BMI 50.13 kg/m   Physical Exam  Constitutional: He appears well-developed and well-nourished. No distress.  HENT:  Head: Normocephalic and atraumatic.  Right Ear: External ear normal.  Left Ear: External ear normal.  Mouth/Throat: Oropharynx is clear and moist.  Eyes: Pupils are equal, round, and reactive to light.  No horizontal, vertical or rotational nystagmus   Neck: Normal range of motion. Neck supple.  Full active and passive ROM without  pain No midline or paraspinal tenderness No nuchal rigidity or meningeal signs   Cardiovascular: Normal rate, regular rhythm, normal heart sounds and intact distal pulses. Exam reveals no gallop and no friction rub.  No murmur heard. Pulmonary/Chest: Effort normal and breath sounds normal. No stridor. No respiratory distress. He has no wheezes. He has no rales. He exhibits no tenderness.  Abdominal: Soft. Bowel sounds are normal. He exhibits no distension and no mass. There is no tenderness. There is no rebound and no guarding.  Musculoskeletal: Normal range of motion.  No midline tenderness to palpation to spine. Full ROM of neck and spine. Full ROM of bilateral shoulders, no tenderness to palpation, No deformity, 5/5 strength to bilateral upper extremities   Neurological: He is alert. He has normal strength. No sensory deficit.  Intact sensation to sharp and dull with > 10 areas on bilateral upper extremities. No atrophy, no abnormal tone.    Mental Status:  Alert, oriented, thought content appropriate. Speech fluent without evidence of aphasia. Able to follow 2 step commands without difficulty.  Cranial Nerves:  II:  Peripheral visual fields grossly normal, pupils equal, round, reactive to light III,IV, VI: ptosis not present, extra-ocular motions intact bilaterally  V,VII: smile symmetric, facial light touch sensation equal VIII: hearing grossly normal bilaterally  IX,X: midline uvula rise  XI: bilateral shoulder shrug equal and strong XII: midline tongue extension  Motor:  5/5 in upper and lower extremities bilaterally including strong and equal grip strength and dorsiflexion/plantar flexion Sensory: Pinprick and light touch normal in all extremities.  Deep Tendon Reflexes: 2+ and symmetric  Cerebellar: normal finger-to-nose with bilateral upper extremities Gait: normal gait and balance CV: distal pulses palpable throughout  Skin: Skin is warm and dry. He is not diaphoretic.  No  edema, erythema, ecchymosis, lesions or rashes.   Psychiatric: He has a normal mood and affect.  Nursing note and vitals reviewed.     ED Treatments / Results  Labs (all labs ordered are listed, but only abnormal results are displayed) Labs Reviewed  CBC WITH DIFFERENTIAL/PLATELET - Abnormal; Notable for the following components:      Result Value   Hemoglobin 12.2 (*)    HCT 38.8 (*)    All other components within normal limits  COMPREHENSIVE METABOLIC PANEL - Abnormal; Notable for the following components:   Glucose, Bld 119 (*)    Albumin 3.4 (*)    All other components within normal limits  TSH  MAGNESIUM  PHOSPHORUS    EKG EKG Interpretation  Date/Time:  Friday November 29 2017 07:00:52 EDT Ventricular Rate:  82 PR Interval:    QRS Duration: 83 QT Interval:  371 QTC Calculation: 434 R Axis:   67 Text Interpretation:  Sinus rhythm Low voltage, precordial leads Borderline T abnormalities, anterior leads No significant change since last tracing Confirmed by Orpah Greek [id number]) on 11/29/2017 7:13:56 AM   Radiology Mr Brain Wo Contrast  Result Date: 11/29/2017 CLINICAL DATA:  Numbness or tingling, paresthesia in the right hand and forearm.  Symptoms for a week. EXAM: MRI HEAD WITHOUT CONTRAST TECHNIQUE: Multiplanar, multiecho pulse sequences of the brain and surrounding structures were obtained without intravenous contrast. COMPARISON:  None. FINDINGS: Brain: No acute infarction, hemorrhage, hydrocephalus, extra-axial collection or mass lesion. 7 mm cerebellar tonsillar ectopia without foramen magnum stenosis to strongly suggest Chiari malformation. Vascular: Major flow voids are preserved Skull and upper cervical spine: No focal marrow lesion. Low marrow signal in the cervical spine which is likely red marrow and likely from anemia or from patient's history of obesity. CBC has been obtained today. Sinuses/Orbits: Mucosal thickening in the paranasal sinuses with  polypoid T2 hyperintensities in the bilateral nasal cavity, larger on the left. IMPRESSION: 1. No acute finding.  No infarct or demyelination. 2. Chronic sinusitis with probable nasal cavity polyps. 3. 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 Electronically Signed   By: Monte Fantasia M.D.   On: 11/29/2017 14:09    Procedures Procedures (including critical care time)  Medications Ordered in ED Medications - No data to display   Initial Impression / Assessment and Plan / ED Course  I have reviewed the triage vital signs and the nursing notes as well as past medical history.   Pertinent labs & imaging results that were available during my care of the patient were reviewed by me and considered in my medical decision making (see chart for details).  Patient with DM presents with bilateral hand tingling and numbness x 1 week. Episode of lightheadedness yesterday. Will obtain labs and EKG and reevaluate.  Non focal neuro exam, without neuro deficits.  Labs unremarkable. Will obtain MR brain to r/o MS, stroke and reevaluate.   MR without signs of MS or acute stroke. Possible Chiari malformation. Symptoms most likely peripheral neuropathy.    Discussed with patient need for close follow-up.  Stable for discharge.  Strict return precautions given. Patient voiced understanding.    Final Clinical Impressions(s) / ED Diagnoses   Final diagnoses:  Paresthesias    ED Discharge Orders    None       Chayton Murata A, PA-C 11/29/17 1437    Charlesetta Shanks, MD 12/04/17 [phone]

## 2017-11-29 NOTE — ED Notes (Signed)
Pt reports numbness and tingling in his right hand and forearm. Pt reports the hand has been going on now for a week and the forearm just started on 11/28/17. Pt denies any injury to either area.

## 2017-11-29 NOTE — ED Notes (Signed)
ED Provider at bedside. 

## 2017-11-29 NOTE — ED Notes (Signed)
Pt transported to MRI 

## 2017-11-29 NOTE — ED Provider Notes (Signed)
Medical screening examination/treatment/procedure(s) were conducted as a shared visit with non-physician practitioner(s) and myself.  I personally evaluated the patient during the encounter.  EKG Interpretation  Date/Time:  Friday November 29 2017 07:00:52 EDT Ventricular Rate:  82 PR Interval:    QRS Duration: 83 QT Interval:  371 QTC Calculation: 434 R Axis:   67 Text Interpretation:  Sinus rhythm Low voltage, precordial leads Borderline T abnormalities, anterior leads No significant change since last tracing Confirmed by Gilda CreasePollina, Christopher J [phone] on 11/29/2017 7:13:56 AM Patient has a recent diagnosis of diabetes.  He reports he is taking insulin.  He has noted just over the past 2 days numbness of his hands.  This is more so in the right hand.  He reports that yesterday it started to ascend slightly up to his forearm.  Decreased sensation is concentrated most around the dorsum of the hand on the thumb and forefinger.  He reports he thought it might be due to repetitive work at his job.  He however does not have any associated wrist or hand pain.  He has no neck pain.  He has no headache.  He denies any visual changes.  Patient is alert and appropriate.  Pupils are consensual.  Extraocular motions normal.  Heart regular.  Lungs clear.  Patient does have decreased differentiation to sharp and dull most pronounced on the right hand at the dorsum around the thumb and forefinger.  Extends slightly up to the wrist.  Patient's sharp dull differentiation near normal on the left hand.  Grip strength and motor testing is normal bilaterally.  Patient clinically well.  Electrolytes stable.  We will proceed with MRI to rule out MS or possible small stroke.   Arby BarrettePfeiffer, Shloima Clinch, MD 11/29/17 [phone]

## 2017-11-29 NOTE — ED Triage Notes (Signed)
Pt c/o numbness to the right hand that started yesterday. Recent diagnosis of DM type 2, states his blood sugars have been controlled. Denies pain.

## 2018-05-28 ENCOUNTER — Ambulatory Visit (HOSPITAL_COMMUNITY)
Admission: EM | Admit: 2018-05-28 | Discharge: 2018-05-28 | Disposition: A | Discharge status: Home / Self Care | Payer: Self-pay | Attending: Family Medicine | Admitting: Family Medicine

## 2018-05-28 ENCOUNTER — Encounter (HOSPITAL_COMMUNITY): Payer: Self-pay

## 2018-05-28 DIAGNOSIS — B9789 Other viral agents as the cause of diseases classified elsewhere: Secondary | ICD-10-CM

## 2018-05-28 DIAGNOSIS — J069 Acute upper respiratory infection, unspecified: Secondary | ICD-10-CM

## 2018-05-28 MED ORDER — FLUTICASONE PROPIONATE 50 MCG/ACT NA SUSP: 1.0000 | Freq: Every day | NASAL | 0 refills | Status: DC

## 2018-05-28 MED ORDER — IBUPROFEN 800 MG PO TABS: 800.0000 mg | ORAL_TABLET | Freq: Three times a day (TID) | ORAL | 0 refills | Status: DC

## 2018-05-28 MED ORDER — PSEUDOEPH-BROMPHEN-DM 30-2-10 MG/5ML PO SYRP: 5.0000 mL | ORAL_SOLUTION | Freq: Four times a day (QID) | ORAL | 0 refills | Status: DC | PRN

## 2018-05-28 NOTE — ED Provider Notes (Signed)
[place]    CSN: [id number] Arrival date & time: 05/28/18  1342     History   Chief Complaint Chief Complaint  Patient presents with  . URI    HPI Dylan Watts is a 28 y.o. male history of obesity, DM type II, presenting today for evaluation of URI symptoms.  Patient states that he has mainly had nasal congestion which has been going on for approximately 3 days.  He has had occasional cough which has been productive.  Denies sore throat or ear pain.  Denies any fevers.  He has developed body aches and chills over the past couple of days.  He has tried DayQuil for his symptoms, last dose last night.  Denies any recent travel.  Denies nausea vomiting or diarrhea.  HPI  Past Medical History:  Diagnosis Date  . Diabetes mellitus without complication [facility])     Patient Active Problem List   Diagnosis Date Noted  . Obesity, Class III, BMI 40-49.9 (morbid obesity) ([place]) 09/08/2017  . Leukocytosis 09/08/2017  . AKI (acute kidney injury) ([place]) 09/08/2017  . Hypokalemia 09/08/2017  . Hypophosphatemia 09/08/2017  . Diabetic ketoacidosis ([place]) 09/07/2017    History reviewed. No pertinent surgical history.     Home Medications    Prior to Admission medications   Medication Sig Start Date End Date Taking? Authorizing Provider  Aspirin-Salicylamide-Caffeine (BC HEADACHE POWDER PO) Take 1 Package by mouth as needed (headache).    [provider]  blood glucose meter kit and supplies KIT Dispense based on patient and insurance preference. Use up to four times daily as directed. (FOR ICD-9 250.00, 250.01). 09/12/17   Kayleen Memos, DO  brompheniramine-pseudoephedrine-DM 30-2-10 MG/5ML syrup Take 5 mLs by mouth 4 (four) times daily as needed. 05/28/18   Wieters, Hallie C, PA-C  fluticasone (FLONASE) 50 MCG/ACT nasal spray Place 1-2 sprays into both nostrils daily for 7 days. 05/28/18 06/04/18  Wieters, Hallie C, PA-C  ibuprofen (ADVIL,MOTRIN) 800 MG tablet Take 1  tablet (800 mg total) by mouth 3 (three) times daily. 05/28/18   Wieters, Hallie C, PA-C  insulin aspart (NOVOLOG) 100 UNIT/ML injection Inject 0-5 Units into the skin at bedtime. Patient not taking: Reported on 11/29/2017 09/12/17   Kayleen Memos, DO  insulin aspart (NOVOLOG) 100 UNIT/ML injection Inject 14 Units into the skin 3 (three) times daily with meals. Patient taking differently: Inject 14 Units into the skin 2 (two) times daily.  09/12/17   Kayleen Memos, DO  insulin detemir (LEVEMIR) 100 UNIT/ML injection Inject 0.32 mLs (32 Units total) into the skin 2 (two) times daily. 09/12/17   Kayleen Memos, DO  metoprolol tartrate (LOPRESSOR) 25 MG tablet Take 1 tablet (25 mg total) by mouth 2 (two) times daily. Patient not taking: Reported on 11/29/2017 09/12/17   Kayleen Memos, DO    Family History Family History  Problem Relation Age of Onset  . Diabetes Mother   . Diabetes Father     Social History Social History   Tobacco Use  . Smoking status: Never Smoker  . Smokeless tobacco: Never Used  Substance Use Topics  . Alcohol use: Yes    Comment: socially  . Drug use: No     Allergies   Patient has no known allergies.   Review of Systems Review of Systems  Constitutional: Positive for chills. Negative for activity change, appetite change, fatigue and fever.  HENT: Positive for congestion and rhinorrhea. Negative for ear pain, sinus pressure, sore  throat and trouble swallowing.   Eyes: Negative for discharge and redness.  Respiratory: Positive for cough. Negative for chest tightness and shortness of breath.   Cardiovascular: Negative for chest pain.  Gastrointestinal: Negative for abdominal pain, diarrhea, nausea and vomiting.  Musculoskeletal: Positive for myalgias.  Skin: Negative for rash.  Neurological: Negative for dizziness, light-headedness and headaches.     Physical Exam Triage Vital Signs ED Triage Vitals [05/28/18 1406]  Enc Vitals Group     BP (!) 141/89      Pulse Rate 91     Resp 20     Temp 98.7 F (37.1 C)     Temp Source Oral     SpO2 100 %     Weight      Height      Head Circumference      Peak Flow      Pain Score 4     Pain Loc      Pain Edu?      Excl. in [place]?    No data found.  Updated Vital Signs BP (!) 141/89 (BP Location: Right Arm)   Pulse 91   Temp 98.7 F (37.1 C) (Oral)   Resp 20   SpO2 100%   Visual Acuity Right Eye Distance:   Left Eye Distance:   Bilateral Distance:    Right Eye Near:   Left Eye Near:    Bilateral Near:     Physical Exam Vitals signs and nursing note reviewed.  Constitutional:      Appearance: He is well-developed.  HENT:     Head: Normocephalic and atraumatic.     Ears:     Comments: Bilateral ears without tenderness to palpation of external auricle, tragus and mastoid, EAC's without erythema or swelling, TM's with good bony landmarks and cone of light. Non erythematous.     Nose:     Comments: Bilateral nasal mucosa slightly erythematous, left nares with swollen turbinates more superiorly and rhinorrhea present    Mouth/Throat:     Comments: Oral mucosa pink and moist, no tonsillar enlargement, exudate present on bilateral tonsils, but without erythema or enlargement. Posterior pharynx patent and nonerythematous, no uvula deviation or swelling. Normal phonation. Eyes:     Conjunctiva/sclera: Conjunctivae normal.  Neck:     Musculoskeletal: Neck supple.  Cardiovascular:     Rate and Rhythm: Normal rate and regular rhythm.     Heart sounds: No murmur.  Pulmonary:     Effort: Pulmonary effort is normal. No respiratory distress.     Breath sounds: Normal breath sounds.     Comments: Breathing comfortably at rest, CTABL, no wheezing, rales or other adventitious sounds auscultated Abdominal:     Palpations: Abdomen is soft.     Tenderness: There is no abdominal tenderness.  Skin:    General: Skin is warm and dry.  Neurological:     Mental Status: He is alert.      UC  Treatments / Results  Labs (all labs ordered are listed, but only abnormal results are displayed) Labs Reviewed - No data to display  EKG None  Radiology No results found.  Procedures Procedures (including critical care time)  Medications Ordered in UC Medications - No data to display  Initial Impression / Assessment and Plan / UC Course  I have reviewed the triage vital signs and the nursing notes.  Pertinent labs & imaging results that were available during my care of the patient were reviewed by me and considered  in my medical decision making (see chart for details).     Patient with URI symptoms x3 days, vital signs stable, lungs clear, attempted to obtain strep swab given exudate, but patient did not tolerate this test and he declined further attempts and testing of this.  Given appearance of tonsils, lack of swelling or erythema, lacking sore throat, feel this is less likely strep.  Will treat symptomatically and supportively, most likely viral etiology.  Continue to monitor temperature, breathing and symptoms,Discussed strict return precautions. Patient verbalized understanding and is agreeable with plan.  Final Clinical Impressions(s) / UC Diagnoses   Final diagnoses:  Viral URI with cough     Discharge Instructions     You likely having a viral upper respiratory infection. We recommended symptom control. I expect your symptoms to start improving in the next 1-2 weeks.  Follow-up if symptoms worsening, developing increased shortness of breath, fevers or difficulty breathing, symptoms not improving by the end of this weekend  1. Take a daily allergy pill/anti-histamine like Zyrtec, Claritin, or Store brand consistently for 2 weeks 2. For congestion you may try an oral decongestant like Mucinex or sudafed. You may also try intranasal flonase nasal spray or saline irrigations (neti pot, sinus cleanse) 3. For your sore throat you may try cepacol lozenges, salt water  gargles, throat spray. Treatment of congestion may also help your sore throat. 4. For cough you may try cough syrup provided or over-the-counter Delsym, Robitussen, Mucinex DM 5. Take Tylenol or Ibuprofen to help with headache/body aches/fever  6. Stay hydrated, drink plenty of fluids to keep throat coated and less irritated  Honey Tea For cough/sore throat try using a honey-based tea. Use 3 teaspoons of honey with juice squeezed from half lemon. Place shaved pieces of ginger into 1/2-1 cup of water and warm over stove top. Then mix the ingredients and repeat every 4 hours as needed.   ED Prescriptions    Medication Sig Dispense Auth. Provider   fluticasone (FLONASE) 50 MCG/ACT nasal spray Place 1-2 sprays into both nostrils daily for 7 days. 1 g Wieters, Hallie C, PA-C   brompheniramine-pseudoephedrine-DM 30-2-10 MG/5ML syrup Take 5 mLs by mouth 4 (four) times daily as needed. 120 mL Wieters, Hallie C, PA-C   ibuprofen (ADVIL,MOTRIN) 800 MG tablet Take 1 tablet (800 mg total) by mouth 3 (three) times daily. 21 tablet Wieters, [place] C, PA-C     Controlled Substance Prescriptions [place] Controlled Substance Registry consulted? Not Applicable   Janith Lima, [place] 05/28/18 1444

## 2018-05-28 NOTE — ED Triage Notes (Signed)
Pt presents with cold symptoms; nasal drainage, with colored mucus, congestion, and shortness of breath.

## 2018-05-28 NOTE — Discharge Instructions (Addendum)
You likely having a viral upper respiratory infection. We recommended symptom control. I expect your symptoms to start improving in the next 1-2 weeks.  Follow-up if symptoms worsening, developing increased shortness of breath, fevers or difficulty breathing, symptoms not improving by the end of this weekend  1. Take a daily allergy pill/anti-histamine like Zyrtec, Claritin, or Store brand consistently for 2 weeks 2. For congestion you may try an oral decongestant like Mucinex or sudafed. You may also try intranasal flonase nasal spray or saline irrigations (neti pot, sinus cleanse) 3. For your sore throat you may try cepacol lozenges, salt water gargles, throat spray. Treatment of congestion may also help your sore throat. 4. For cough you may try cough syrup provided or over-the-counter Delsym, Robitussen, Mucinex DM 5. Take Tylenol or Ibuprofen to help with headache/body aches/fever  6. Stay hydrated, drink plenty of fluids to keep throat coated and less irritated  Honey Tea For cough/sore throat try using a honey-based tea. Use 3 teaspoons of honey with juice squeezed from half lemon. Place shaved pieces of ginger into 1/2-1 cup of water and warm over stove top. Then mix the ingredients and repeat every 4 hours as needed.

## 2019-01-22 ENCOUNTER — Ambulatory Visit: Payer: Self-pay | Attending: Family Medicine | Admitting: Licensed Clinical Social Worker

## 2019-01-22 ENCOUNTER — Encounter: Payer: Self-pay | Admitting: Family Medicine

## 2019-01-22 ENCOUNTER — Ambulatory Visit: Payer: Self-pay | Attending: Family Medicine | Admitting: Family Medicine

## 2019-01-22 ENCOUNTER — Other Ambulatory Visit: Payer: Self-pay

## 2019-01-22 VITALS — BP 157/100 | HR 109 | Temp 98.5°F | Resp 18 | Ht 73.0 in | Wt 379.0 lb

## 2019-01-22 DIAGNOSIS — E1165 Type 2 diabetes mellitus with hyperglycemia: Secondary | ICD-10-CM

## 2019-01-22 DIAGNOSIS — Z91199 Patient's noncompliance with other medical treatment and regimen due to unspecified reason: Secondary | ICD-10-CM

## 2019-01-22 DIAGNOSIS — I10 Essential (primary) hypertension: Secondary | ICD-10-CM

## 2019-01-22 DIAGNOSIS — Z9119 Patient's noncompliance with other medical treatment and regimen: Secondary | ICD-10-CM

## 2019-01-22 DIAGNOSIS — F419 Anxiety disorder, unspecified: Secondary | ICD-10-CM

## 2019-01-22 DIAGNOSIS — Z6841 Body Mass Index (BMI) 40.0 and over, adult: Secondary | ICD-10-CM

## 2019-01-22 LAB — GLUCOSE, POCT (MANUAL RESULT ENTRY): POC Glucose: 307 mg/dL — AB (ref 70–99)

## 2019-01-22 LAB — POCT GLYCOSYLATED HEMOGLOBIN (HGB A1C): Hemoglobin A1C: 11.8 % — AB (ref 4.0–5.6)

## 2019-01-22 MED ORDER — GLIPIZIDE 5 MG PO TABS: 5.0000 mg | ORAL_TABLET | Freq: Two times a day (BID) | ORAL | 3 refills | Status: DC

## 2019-01-22 MED ORDER — LOSARTAN POTASSIUM 50 MG PO TABS: 50.0000 mg | ORAL_TABLET | Freq: Every day | ORAL | 3 refills | Status: DC

## 2019-01-22 MED ORDER — VENLAFAXINE HCL ER 75 MG PO CP24: 75.0000 mg | ORAL_CAPSULE | Freq: Every day | ORAL | 3 refills | Status: DC

## 2019-01-22 MED ORDER — METFORMIN HCL 850 MG PO TABS: 850.0000 mg | ORAL_TABLET | Freq: Two times a day (BID) | ORAL | 3 refills | Status: DC

## 2019-01-22 MED FILL — glipiZIDE 5 MG TABS: 5 | 30 days supply | Qty: 60 | Fill #0

## 2019-01-22 MED FILL — metFORMIN HCL 850 MG TABS: 850 | 30 days supply | Qty: 60 | Fill #0

## 2019-01-22 MED FILL — VENLAFAXINE HCL ER 75 MG CA: 75 | 30 days supply | Qty: 30 | Fill #0

## 2019-01-22 MED FILL — LOSARTAN POTASSIUM 50 MG TA: 50 | 30 days supply | Qty: 30 | Fill #0
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
## 2019-01-22 NOTE — Patient Instructions (Signed)
Blood Glucose Monitoring, Adult Monitoring your blood sugar (glucose) is an important part of managing your diabetes (diabetes mellitus). Blood glucose monitoring involves checking your blood glucose as often as directed and keeping a record (log) of your results over time. Checking your blood glucose regularly and keeping a blood glucose log can:  Help you and your health care provider adjust your diabetes management plan as needed, including your medicines or insulin.  Help you understand how food, exercise, illnesses, and medicines affect your blood glucose.  Let you know what your blood glucose is at any time. You can quickly find out if you have low blood glucose (hypoglycemia) or high blood glucose (hyperglycemia). Your health care provider will set individualized treatment goals for you. Your goals will be based on your age, other medical conditions you have, and how you respond to diabetes treatment. Generally, the goal of treatment is to maintain the following blood glucose levels:  Before meals (preprandial): 80-130 mg/dL (4.4-7.2 mmol/L).  After meals (postprandial): below 180 mg/dL (10 mmol/L).  A1c level: less than 7%. Supplies needed:  Blood glucose meter.  Test strips for your meter. Each meter has its own strips. You must use the strips that came with your meter.  A needle to prick your finger (lancet). Do not use a lancet more than one time.  A device that holds the lancet (lancing device).  A journal or log book to write down your results. How to check your blood glucose  1. Wash your hands with soap and water. 2. Prick the side of your finger (not the tip) with the lancet. Use a different finger each time. 3. Gently rub the finger until a small drop of blood appears. 4. Follow instructions that come with your meter for inserting the test strip, applying blood to the strip, and using your blood glucose meter. 5. Write down your result and any notes. Some meters  allow you to use areas of your body other than your finger (alternative sites) to test your blood. The most common alternative sites are:  Forearm.  Thigh.  Palm of the hand. If you think you may have hypoglycemia, or if you have a history of not knowing when your blood glucose is getting low (hypoglycemia unawareness), do not use alternative sites. Use your finger instead. Alternative sites may not be as accurate as the fingers, because blood flow is slower in these areas. This means that the result you get may be delayed, and it may be different from the result that you would get from your finger. Follow these instructions at home: Blood glucose log   Every time you check your blood glucose, write down your result. Also write down any notes about things that may be affecting your blood glucose, such as your diet and exercise for the day. This information can help you and your health care provider: ? Look for patterns in your blood glucose over time. ? Adjust your diabetes management plan as needed.  Check if your meter allows you to download your records to a computer. Most glucose meters store a record of glucose readings in the meter. If you have type 1 diabetes:  Check your blood glucose 2 or more times a day.  Also check your blood glucose: ? Before every insulin injection. ? Before and after exercise. ? Before meals. ? 2 hours after a meal. ? Occasionally between 2:00 a.m. and 3:00 a.m., as directed. ? Before potentially dangerous tasks, like driving or using heavy machinery. ?   At bedtime.  You may need to check your blood glucose more often, up to 6-10 times a day, if you: ? Use an insulin pump. ? Need multiple daily injections (MDI). ? Have diabetes that is not well-controlled. ? Are ill. ? Have a history of severe hypoglycemia. ? Have hypoglycemia unawareness. If you have type 2 diabetes:  If you take insulin or other diabetes medicines, check your blood glucose 2 or  more times a day.  If you are on intensive insulin therapy, check your blood glucose 4 or more times a day. Occasionally, you may also need to check between 2:00 a.m. and 3:00 a.m., as directed.  Also check your blood glucose: ? Before and after exercise. ? Before potentially dangerous tasks, like driving or using heavy machinery.  You may need to check your blood glucose more often if: ? Your medicine is being adjusted. ? Your diabetes is not well-controlled. ? You are ill. General tips  Always keep your supplies with you.  If you have questions or need help, all blood glucose meters have a 24-hour "hotline" phone number that you can call. You may also contact your health care provider.  After you use a few boxes of test strips, adjust (calibrate) your blood glucose meter by following instructions that came with your meter. Contact a health care provider if:  Your blood glucose is at or above 240 mg/dL (13.3 mmol/L) for 2 days in a row.  You have been sick or have had a fever for 2 days or longer, and you are not getting better.  You have any of the following problems for more than 6 hours: ? You cannot eat or drink. ? You have nausea or vomiting. ? You have diarrhea. Get help right away if:  Your blood glucose is lower than 54 mg/dL (3 mmol/L).  You become confused or you have trouble thinking clearly.  You have difficulty breathing.  You have moderate or large ketone levels in your urine. Summary  Monitoring your blood sugar (glucose) is an important part of managing your diabetes (diabetes mellitus).  Blood glucose monitoring involves checking your blood glucose as often as directed and keeping a record (log) of your results over time.  Your health care provider will set individualized treatment goals for you. Your goals will be based on your age, other medical conditions you have, and how you respond to diabetes treatment.  Every time you check your blood glucose,  write down your result. Also write down any notes about things that may be affecting your blood glucose, such as your diet and exercise for the day. This information is not intended to replace advice given to you by your health care provider. Make sure you discuss any questions you have with your health care provider. Document Released: 03/08/2003 Document Revised: 12/27/2017 Document Reviewed: 08/15/2015 Elsevier Patient Education  2020 [place] for Diabetes Mellitus, Adult  Carbohydrate counting is a method of keeping track of how many carbohydrates you eat. Eating carbohydrates naturally increases the amount of sugar (glucose) in the blood. Counting how many carbohydrates you eat helps keep your blood glucose within normal limits, which helps you manage your diabetes (diabetes mellitus). It is important to know how many carbohydrates you can safely have in each meal. This is different for every person. A diet and nutrition specialist (registered dietitian) can help you make a meal plan and calculate how many carbohydrates you should have at each meal and snack. Carbohydrates are  found in the following foods:  Grains, such as breads and cereals.  Dried beans and soy products.  Starchy vegetables, such as potatoes, peas, and corn.  Fruit and fruit juices.  Milk and yogurt.  Sweets and snack foods, such as cake, cookies, candy, chips, and soft drinks. How do I count carbohydrates? There are two ways to count carbohydrates in food. You can use either of the methods or a combination of both. Reading "Nutrition Facts" on packaged food The "Nutrition Facts" list is included on the labels of almost all packaged foods and beverages in the [place] It includes:  The serving size.  Information about nutrients in each serving, including the grams (g) of carbohydrate per serving. To use the "Nutrition Facts":  Decide how many servings you will have.  Multiply the  number of servings by the number of carbohydrates per serving.  The resulting number is the total amount of carbohydrates that you will be having. Learning standard serving sizes of other foods When you eat carbohydrate foods that are not packaged or do not include "Nutrition Facts" on the label, you need to measure the servings in order to count the amount of carbohydrates:  Measure the foods that you will eat with a food scale or measuring cup, if needed.  Decide how many standard-size servings you will eat.  Multiply the number of servings by 15. Most carbohydrate-rich foods have about 15 g of carbohydrates per serving. ? For example, if you eat 8 oz (170 g) of strawberries, you will have eaten 2 servings and 30 g of carbohydrates (2 servings x 15 g = 30 g).  For foods that have more than one food mixed, such as soups and casseroles, you must count the carbohydrates in each food that is included. The following list contains standard serving sizes of common carbohydrate-rich foods. Each of these servings has about 15 g of carbohydrates:   hamburger bun or  English muffin.   oz (15 mL) syrup.   oz (14 g) jelly.  1 slice of bread.  1 six-inch tortilla.  3 oz (85 g) cooked rice or pasta.  4 oz (113 g) cooked dried beans.  4 oz (113 g) starchy vegetable, such as peas, corn, or potatoes.  4 oz (113 g) hot cereal.  4 oz (113 g) mashed potatoes or  of a large baked potato.  4 oz (113 g) canned or frozen fruit.  4 oz (120 mL) fruit juice.  4-6 crackers.  6 chicken nuggets.  6 oz (170 g) unsweetened dry cereal.  6 oz (170 g) plain fat-free yogurt or yogurt sweetened with artificial sweeteners.  8 oz (240 mL) milk.  8 oz (170 g) fresh fruit or one small piece of fruit.  24 oz (680 g) popped popcorn. Example of carbohydrate counting Sample meal  3 oz (85 g) chicken breast.  6 oz (170 g) brown rice.  4 oz (113 g) corn.  8 oz (240 mL) milk.  8 oz (170 g)  strawberries with sugar-free whipped topping. Carbohydrate calculation 1. Identify the foods that contain carbohydrates: ? Rice. ? Corn. ? Milk. ? Strawberries. 2. Calculate how many servings you have of each food: ? 2 servings rice. ? 1 serving corn. ? 1 serving milk. ? 1 serving strawberries. 3. Multiply each number of servings by 15 g: ? 2 servings rice x 15 g = 30 g. ? 1 serving corn x 15 g = 15 g. ? 1 serving milk x 15 g =  15 g. ? 1 serving strawberries x 15 g = 15 g. 4. Add together all of the amounts to find the total grams of carbohydrates eaten: ? 30 g + 15 g + 15 g + 15 g = 75 g of carbohydrates total. Summary  Carbohydrate counting is a method of keeping track of how many carbohydrates you eat.  Eating carbohydrates naturally increases the amount of sugar (glucose) in the blood.  Counting how many carbohydrates you eat helps keep your blood glucose within normal limits, which helps you manage your diabetes.  A diet and nutrition specialist (registered dietitian) can help you make a meal plan and calculate how many carbohydrates you should have at each meal and snack. This information is not intended to replace advice given to you by your health care provider. Make sure you discuss any questions you have with your health care provider. Document Released: 03/05/2005 Document Revised: 09/27/2016 Document Reviewed: 08/17/2015 Elsevier Patient Education  2020 [employer].  Hypertension, Adult Hypertension is another name for high blood pressure. High blood pressure forces your heart to work harder to pump blood. This can cause problems over time. There are two numbers in a blood pressure reading. There is a top number (systolic) over a bottom number (diastolic). It is best to have a blood pressure that is below 120/80. Healthy choices can help lower your blood pressure, or you may need medicine to help lower it. What are the causes? The cause of this condition is not  known. Some conditions may be related to high blood pressure. What increases the risk?  Smoking.  Having type 2 diabetes mellitus, high cholesterol, or both.  Not getting enough exercise or physical activity.  Being overweight.  Having too much fat, sugar, calories, or salt (sodium) in your diet.  Drinking too much alcohol.  Having long-term (chronic) kidney disease.  Having a family history of high blood pressure.  Age. Risk increases with age.  Race. You may be at higher risk if you are African American.  Gender. Men are at higher risk than women before age 26. After age 43, women are at higher risk than men.  Having obstructive sleep apnea.  Stress. What are the signs or symptoms?  High blood pressure may not cause symptoms. Very high blood pressure (hypertensive crisis) may cause: ? Headache. ? Feelings of worry or nervousness (anxiety). ? Shortness of breath. ? Nosebleed. ? A feeling of being sick to your stomach (nausea). ? Throwing up (vomiting). ? Changes in how you see. ? Very bad chest pain. ? Seizures. How is this treated?  This condition is treated by making healthy lifestyle changes, such as: ? Eating healthy foods. ? Exercising more. ? Drinking less alcohol.  Your health care provider may prescribe medicine if lifestyle changes are not enough to get your blood pressure under control, and if: ? Your top number is above 130. ? Your bottom number is above 80.  Your personal target blood pressure may vary. Follow these instructions at home: Eating and drinking   If told, follow the DASH eating plan. To follow this plan: ? Fill one half of your plate at each meal with fruits and vegetables. ? Fill one fourth of your plate at each meal with whole grains. Whole grains include whole-wheat pasta, brown rice, and whole-grain bread. ? Eat or drink low-fat dairy products, such as skim milk or low-fat yogurt. ? Fill one fourth of your plate at each meal  with low-fat (lean) proteins. Low-fat  proteins include fish, chicken without skin, eggs, beans, and tofu. ? Avoid fatty meat, cured and processed meat, or chicken with skin. ? Avoid pre-made or processed food.  Eat less than 1,500 mg of salt each day.  Do not drink alcohol if: ? Your doctor tells you not to drink. ? You are pregnant, may be pregnant, or are planning to become pregnant.  If you drink alcohol: ? Limit how much you use to:  0-1 drink a day for women.  0-2 drinks a day for men. ? Be aware of how much alcohol is in your drink. In the [place] one drink equals one 12 oz bottle of beer (355 mL), one 5 oz glass of wine (148 mL), or one 1 oz glass of hard liquor (44 mL). Lifestyle   Work with your doctor to stay at a healthy weight or to lose weight. Ask your doctor what the best weight is for you.  Get at least 30 minutes of exercise most days of the week. This may include walking, swimming, or biking.  Get at least 30 minutes of exercise that strengthens your muscles (resistance exercise) at least 3 days a week. This may include lifting weights or doing Pilates.  Do not use any products that contain nicotine or tobacco, such as cigarettes, e-cigarettes, and chewing tobacco. If you need help quitting, ask your doctor.  Check your blood pressure at home as told by your doctor.  Keep all follow-up visits as told by your doctor. This is important. Medicines  Take over-the-counter and prescription medicines only as told by your doctor. Follow directions carefully.  Do not skip doses of blood pressure medicine. The medicine does not work as well if you skip doses. Skipping doses also puts you at risk for problems.  Ask your doctor about side effects or reactions to medicines that you should watch for. Contact a doctor if you:  Think you are having a reaction to the medicine you are taking.  Have headaches that keep coming back (recurring).  Feel dizzy.  Have swelling  in your ankles.  Have trouble with your vision. Get help right away if you:  Get a very bad headache.  Start to feel mixed up (confused).  Feel weak or numb.  Feel faint.  Have very bad pain in your: ? Chest. ? Belly (abdomen).  Throw up more than once.  Have trouble breathing. Summary  Hypertension is another name for high blood pressure.  High blood pressure forces your heart to work harder to pump blood.  For most people, a normal blood pressure is less than 120/80.  Making healthy choices can help lower blood pressure. If your blood pressure does not get lower with healthy choices, you may need to take medicine. This information is not intended to replace advice given to you by your health care provider. Make sure you discuss any questions you have with your health care provider. Document Released: 08/22/2007 Document Revised: 11/13/2017 Document Reviewed: 11/13/2017 Elsevier Patient Education  2020 [employer]

## 2019-01-22 NOTE — Progress Notes (Signed)
Subjective:  Patient ID: Dylan Watts, male    DOB: [birth year]  Age: 28 y.o. MRN: [id number]  CC: Diabetes and Anxiety   HPI Dylan Watts, 28 year old male new to the practice, who presents to establish care and follow-up of type 2 diabetes which he states was diagnosed about 2 years ago.  He was seen in the emergency department earlier this year after running out of medications and patient states that he has now been out of medications for 4 or more months.  He has been trying to follow a keto diet and he states that he has home blood sugars are often in the low 100s.  At today's visit, patient's blood sugar was 307.  Patient states that he has not eaten prior to today's visit but did eat late last night at about 10:00 pm.  He reports that his meal consisted of fried chicken, mashed potatoes and green beans.  When asked if he had bread and what type of beverage, he reported that he also ate a biscuit and had a large sweet tea with his meal.  He states that prior to being diagnosed with diabetes initially he was drinking a great deal of sweet tea and he states that he was told that this may have been the cause of his diabetes.  He feels that if he eats properly and continues to lose weight that he can manage his blood sugars without the use of insulin and he would like to try to see if he can control his sugars without insulin.        He also reports that he was placed on blood pressure medication at a past visit to the emergency department but once he ran out of the medication he is no longer been taking anything for his blood pressure.  He denies any headaches or dizziness related to his blood pressure.  He denies any issues with peripheral edema.  Regarding his diabetes, when his sugars are higher he does have episodes of increased thirst and urinary frequency.        Upon questioning regarding his PHQ-9/depression screening form, patient states that he recently feels very unmotivated and feels  anxious.  He states that some days he does not want to get out of bed.  He denies any direct suicidal ideation and no thoughts of self-harm but sometimes wonders why he is still living/what is the point of still being alive.  Patient reports that he is not currently working but previously worked in [employer] (medical).  He does report having a supportive environment/social group including his family and current roommates.  He would be agreeable to taking medication to help with anxiety and depression.  Past Medical History:  Diagnosis Date  . Diabetes mellitus without complication [facility])     Past Surgical History:  Procedure Laterality Date  . NO PAST SURGERIES      Family History  Problem Relation Age of Onset  . Diabetes Mother   . Diabetes Father   . Lupus Father   . Diabetes Maternal Grandmother   . Hypertension Maternal Grandmother   . Diabetes Paternal Grandmother   . Stroke Paternal Grandmother   . Hypertension Paternal Grandmother     Social History   Tobacco Use  . Smoking status: Never Smoker  . Smokeless tobacco: Never Used  Substance Use Topics  . Alcohol use: Yes    Comment: socially    ROS Review of Systems  Constitutional: Positive for fatigue (occasional). Negative for chills and  fever.  HENT: Negative for sore throat and trouble swallowing.   Eyes: Negative for photophobia and visual disturbance.  Respiratory: Negative for cough and shortness of breath.   Cardiovascular: Negative for chest pain, palpitations and leg swelling.  Gastrointestinal: Negative for abdominal pain, blood in stool, constipation and diarrhea.  Endocrine: Positive for polydipsia, polyphagia and polyuria. Negative for cold intolerance and heat intolerance.  Genitourinary: Positive for frequency (occaionally when BS high). Negative for dysuria.  Musculoskeletal: Negative for arthralgias and back pain.  Skin: Negative for rash and wound.  Neurological: Negative for dizziness and headaches.    Hematological: Negative for adenopathy. Does not bruise/bleed easily.    Objective:   Today's Vitals: BP (!) 157/100 (BP Location: Left Arm, Patient Position: Sitting, Cuff Size: Large)   Pulse (!) 109   Temp 98.5 F (36.9 C) (Oral)   Resp 18   Ht _0  (1.854 m)   Wt (!) 379 lb (171.9 kg)   SpO2 100%   BMI 50.00 kg/m   Physical Exam Constitutional:      General: He is not in acute distress.    Appearance: Normal appearance. He is obese. He is not ill-appearing.  Neck:     Musculoskeletal: Normal range of motion and neck supple. No muscular tenderness.  Cardiovascular:     Rate and Rhythm: Normal rate and regular rhythm.     Pulses:          Dorsalis pedis pulses are 2+ on the right side and 2+ on the left side.       Posterior tibial pulses are 2+ on the right side and 2+ on the left side.  Pulmonary:     Effort: Pulmonary effort is normal.     Breath sounds: Normal breath sounds.  Abdominal:     Palpations: Abdomen is soft.     Tenderness: There is no abdominal tenderness. There is no right CVA tenderness, left CVA tenderness, guarding or rebound.  Musculoskeletal:        General: No tenderness or deformity.     Right lower leg: No edema.     Left lower leg: No edema.     Right foot: Normal range of motion. No deformity.     Left foot: Normal range of motion. No deformity.  Feet:     Right foot:     Protective Sensation: 10 sites tested. 10 sites sensed.     Skin integrity: Dry skin present. No ulcer, blister, skin breakdown, erythema, warmth, callus or fissure.     Toenail Condition: Right toenails are normal.     Left foot:     Protective Sensation: 10 sites tested. 9 sites sensed.     Skin integrity: Callus and dry skin present. No ulcer, blister, skin breakdown, erythema, warmth or fissure.     Toenail Condition: Left toenails are normal.  Lymphadenopathy:     Cervical: No cervical adenopathy.  Skin:    General: Skin is warm and dry.     Findings: No rash.   Neurological:     General: No focal deficit present.     Mental Status: He is alert and oriented to person, place, and time.  Psychiatric:        Mood and Affect: Mood normal.        Behavior: Behavior normal.     Assessment & Plan:  1. Uncontrolled type 2 diabetes mellitus with hyperglycemia, without long-term current use of insulin ([place]); noncompliance with treatment of diabetes Patient's last hemoglobin  A1c in the chart was 13.3 on 09/08/2017 and at today's visit, his A1c was 11.8.  Patient has been noncompliant with medications and follow-up of his diabetes.  He would like to try oral medications and will be placed on metformin 850 mg twice daily and glipizide 5 mg twice daily.  He has been asked to follow-up with the clinical pharmacist and bring a blood sugar diary as well as his glucometer.  Patient states that when he follows an appropriate diet, his blood sugars are controlled however discussed with the patient that his blood sugar today at the visit was 307 fasting and hemoglobin A1c shows that blood sugars have been elevated consistently over the last 90 days.  Patient was encouraged to follow a low carbohydrate diet and avoid soda/juice and sweet tea.  He will also have comprehensive metabolic panel at today's visit and lipid panel.  Educational material on monitoring of blood sugars and carbohydrate counting provided to the patient as part of his AVS. - HgB A1c - Glucose (CBG) - Comprehensive metabolic panel - metFORMIN (GLUCOPHAGE) 850 MG tablet; Take 1 tablet (850 mg total) by mouth 2 (two) times daily with a meal.  Dispense: 60 tablet; Refill: 3 - glipiZIDE (GLUCOTROL) 5 MG tablet; Take 1 tablet (5 mg total) by mouth 2 (two) times daily before a meal.  Dispense: 60 tablet; Refill: 3 - Lipid panel  2. Essential hypertension Patient's blood pressure is elevated at today's visit and discussed with the patient that since he is also diabetic that he would be placed on a medication  that could help with blood pressure as well is with renal protection.  Prescription provided for Cozaar 50 mg and patient given information on hypertension as part of his AVS.  Patient will have CMP and lipid panel at today's visit and he is encouraged to follow a low-sodium/Dash type diet and continue efforts at weight loss.  Patient will have 2-week nurse visit blood pressure recheck and dose of his losartan can be increased at that time if needed. - losartan (COZAAR) 50 MG tablet; Take 1 tablet (50 mg total) by mouth daily.  Dispense: 90 tablet; Refill: 3 - Lipid panel  3. Morbid obesity with BMI of 50.0-59.9, adult [facility]) Patient with morbid obesity and he is encouraged to make dietary changes and begin a regular exercise program to help facilitate weight loss which will help with insulin resistance.  Patient may benefit from referral to a medical weight loss program/bariatric surgery pathway in the future.  4. Anxiety Patient with abnormal PHQ-9 score/findings and discussed this with the patient and he was agreeable to start medication to help with anxiety/depression.  Patient also had consultation at today's visit with social work.  Patient will be started on venlafaxine 75 mg once in the morning to help with anxiety and to increase his level of motivation.  He was made aware to take the medication early in the mornings to help avoid sleep related issues due to the medication.  He was made aware that the medication may take up to 4 weeks for him to feel the maximum benefit and he should try to be compliant with the medication during that time.  And schedule follow-up here in the office in 4 to 6 weeks regarding his diabetes and anxiety. - venlafaxine XR (EFFEXOR-XR) 75 MG 24 hr capsule; Take 1 capsule (75 mg total) by mouth daily with breakfast. To help with anxiety  Dispense: 30 capsule; Refill: 3  Outpatient Encounter Medications as  of 01/22/2019  Medication Sig  . Aspirin-Salicylamide-Caffeine  (BC HEADACHE POWDER PO) Take 1 Package by mouth as needed (headache).  . blood glucose meter kit and supplies KIT Dispense based on patient and insurance preference. Use up to four times daily as directed. (FOR ICD-9 250.00, 250.01).  . brompheniramine-pseudoephedrine-DM 30-2-10 MG/5ML syrup Take 5 mLs by mouth 4 (four) times daily as needed.  Marland Kitchen ibuprofen (ADVIL,MOTRIN) 800 MG tablet Take 1 tablet (800 mg total) by mouth 3 (three) times daily.  . fluticasone (FLONASE) 50 MCG/ACT nasal spray Place 1-2 sprays into both nostrils daily for 7 days. (Patient not taking: Reported on 01/22/2019)  . insulin aspart (NOVOLOG) 100 UNIT/ML injection Inject 0-5 Units into the skin at bedtime. (Patient not taking: Reported on 11/29/2017)  . insulin aspart (NOVOLOG) 100 UNIT/ML injection Inject 14 Units into the skin 3 (three) times daily with meals. (Patient not taking: Reported on 01/22/2019)  . insulin detemir (LEVEMIR) 100 UNIT/ML injection Inject 0.32 mLs (32 Units total) into the skin 2 (two) times daily. (Patient not taking: Reported on 01/22/2019)  . metoprolol tartrate (LOPRESSOR) 25 MG tablet Take 1 tablet (25 mg total) by mouth 2 (two) times daily. (Patient not taking: Reported on 11/29/2017)   No facility-administered encounter medications on file as of 01/22/2019.    An After Visit Summary was printed and given to the patient.   Follow-up: Return in about 4 weeks (around 02/19/2019) for DM- 2 week nurse visit for BP recheck.    Antony Blackbird MD

## 2019-01-23 LAB — COMPREHENSIVE METABOLIC PANEL WITH GFR
ALT: 24 IU/L (ref 0–44)
AST: 12 IU/L (ref 0–40)
Albumin/Globulin Ratio: 0.9 — ABNORMAL LOW (ref 1.2–2.2)
Albumin: 4.2 g/dL (ref 4.1–5.2)
Alkaline Phosphatase: 134 IU/L — ABNORMAL HIGH (ref 39–117)
BUN/Creatinine Ratio: 6 — ABNORMAL LOW (ref 9–20)
BUN: 5 mg/dL — ABNORMAL LOW (ref 6–20)
Bilirubin Total: 0.3 mg/dL (ref 0.0–1.2)
CO2: 16 mmol/L — ABNORMAL LOW (ref 20–29)
Calcium: 10 mg/dL (ref 8.7–10.2)
Chloride: 97 mmol/L (ref 96–106)
Creatinine, Ser: 0.8 mg/dL (ref 0.76–1.27)
GFR calc Af Amer: 140 mL/min/1.73
GFR calc non Af Amer: 121 mL/min/1.73
Globulin, Total: 4.6 g/dL — ABNORMAL HIGH (ref 1.5–4.5)
Glucose: 318 mg/dL — ABNORMAL HIGH (ref 65–99)
Potassium: 4 mmol/L (ref 3.5–5.2)
Sodium: 137 mmol/L (ref 134–144)
Total Protein: 8.8 g/dL — ABNORMAL HIGH (ref 6.0–8.5)

## 2019-01-23 LAB — LIPID PANEL
Chol/HDL Ratio: 4.2 ratio (ref 0.0–5.0)
Cholesterol, Total: 140 mg/dL (ref 100–199)
HDL: 33 mg/dL — ABNORMAL LOW
LDL Chol Calc (NIH): 91 mg/dL (ref 0–99)
Triglycerides: 84 mg/dL (ref 0–149)
VLDL Cholesterol Cal: 16 mg/dL (ref 5–40)

## 2019-02-05 ENCOUNTER — Ambulatory Visit: Payer: Self-pay | Attending: Family Medicine | Admitting: Pharmacist

## 2019-02-05 ENCOUNTER — Other Ambulatory Visit: Payer: Self-pay

## 2019-02-05 VITALS — BP 136/92 | HR 111

## 2019-02-05 DIAGNOSIS — I10 Essential (primary) hypertension: Secondary | ICD-10-CM

## 2019-02-05 DIAGNOSIS — E1165 Type 2 diabetes mellitus with hyperglycemia: Secondary | ICD-10-CM

## 2019-02-05 MED ORDER — LOSARTAN POTASSIUM 100 MG PO TABS: 100.0000 mg | ORAL_TABLET | Freq: Every day | ORAL | 2 refills | Status: DC

## 2019-02-05 MED ORDER — ATORVASTATIN CALCIUM 20 MG PO TABS: 20.0000 mg | ORAL_TABLET | Freq: Every day | ORAL | 2 refills | Status: DC

## 2019-02-05 MED ORDER — METFORMIN HCL 1000 MG PO TABS: ORAL_TABLET | ORAL | 2 refills | Status: DC

## 2019-02-05 MED FILL — LOSARTAN POTASSIUM 100 MG T: 100 | 30 days supply | Qty: 30 | Fill #0

## 2019-02-05 MED FILL — metFORMIN HCL 1000 MG TABS: 1000 | 34 days supply | Qty: 60 | Fill #0

## 2019-02-05 MED FILL — ATORVASTATIN CALCIUM 20 MG: 20 | 30 days supply | Qty: 30 | Fill #0

## 2019-02-05 NOTE — Progress Notes (Signed)
    S:    PCP: Dr. Chapman Fitch  No chief complaint on file.  Patient arrives in good spirits.  Presents for diabetes evaluation, education, and management Patient was referred and last seen by Primary Care Provider on 01/22/2019.   Patient reports Diabetes was diagnosed in ~2 years ago.   Family/Social History:  - FHx: significant for DM, HTN, stroke - Occasional alcohol use - Never smoker  Insurance coverage/medication affordability: self-pay  Patient reports adherence with medications.  Current diabetes medications include: glipizide 5 mg BID, metformin 850 mg daily Current hypertension medications include: losartan 50 mg daily, metoprolol 25 mg BID Current hyperlipidemia medications include: none  Patient denies hypoglycemic events.  Patient reported dietary habits: - Reports eating fried foods ~2x/wk - Denies drinking caffeine   Patient-reported exercise habits:  - Lifts (Boflex) 2-3x/week - Walks daily    Patient denies nocturia. Patient denies neuropathy. Patient denies visual changes. Patient reports self foot exams.    O:   Lab Results  Component Value Date   HGBA1C 11.8 (A) 01/22/2019   Vitals:   02/05/19 1027  BP: (!) 136/92  Pulse: (!) 111   Lipid Panel     Component Value Date/Time   CHOL 140 01/22/2019 1055   TRIG 84 01/22/2019 1055   HDL 33 (L) 01/22/2019 1055   CHOLHDL 4.2 01/22/2019 1055   LDLCALC 91 01/22/2019 1055   Home fasting blood sugars:  Reports 200-300s; no meter to evaluate   Clinical Atherosclerotic Cardiovascular Disease (ASCVD): No  The ASCVD Risk score Mikey Bussing DC Jr., et al., 2013) failed to calculate for the following reasons:   The 2013 ASCVD risk score is only valid for ages 77 to 95    A/P: Diabetes longstanding currently uncontrolled. Patient is able to verbalize appropriate hypoglycemia management plan. Patient is adherent with medication. He likely needs injectable therapy but declines adding at this time. I will increase  his metformin to 1g BI, but he is aware that we will need to employ the use of insulin or a GLP-1 RA. We discussed Trulicity and it's potential role.   -Increased dose of metformin to 1000 mg BID. -Continue glipizide 5 mg BID.  -Extensively discussed pathophysiology of diabetes, recommended lifestyle interventions, dietary effects on blood sugar control -Counseled on s/sx of and management of hypoglycemia -Next A1C anticipated 04/2019.   ASCVD risk - primary prevention in patient with diabetes. Last LDL 91. controlled. ASCVD risk score cannot be calculated d/t age.  I recommend moderate intensity statin at this time. Pt is amenable to atorvastatin.  -Started atorvastatin 20 mg.   HTN - BP today above goal but improved from last clinic reading. BP goal < 130/80 mmHg. Pt reports adherence to losartan; will increase to 100 mg daily for better control.  - Increase losartan to 100 mg daily.   HM: Pt is due for PNA, influenza and tetanus vaccines. Will prioritize PNA and flu.  - Fluarix - Pneumovax - Will coordinate MAP with pharmacy  Written patient instructions provided. Total time in face to face counseling 15 minutes.   Follow up PCP Clinic Visit 02/20/19.    Benard Halsted, PharmD, [place] [phone]

## 2019-02-05 NOTE — Patient Instructions (Signed)
Thank you for coming to see me today. Please do the following:  1. Increase metformin. Take one 1000 mg tablet daily after breakfast for 1 week. Then, increase to 1 tablet twice a day after meals.  2. Continue glipizide.  3. Start taking losartan 100 mg daily. You are to take 1 tablet daily.  4. Start taking atorvastatin 20 mg daily. You are to take 1 tablet daily.  5. Continue checking blood sugars at home. 6. Continue making the lifestyle changes we've discussed together during our visit. Diet and exercise play a significant role in improving your blood sugars.  7. Follow-up with Dr. Chapman Fitch next month.    Hypoglycemia or low blood sugar:   Low blood sugar can happen quickly and may become an emergency if not treated right away.   While this shouldn't happen often, it can be brought upon if you skip a meal or do not eat enough. Also, if your insulin or other diabetes medications are dosed too high, this can cause your blood sugar to go to low.   Warning signs of low blood sugar include: 1. Feeling shaky or dizzy 2. Feeling weak or tired  3. Excessive hunger 4. Feeling anxious or upset  5. Sweating even when you aren't exercising  What to do if I experience low blood sugar? 1. Check your blood sugar with your meter. If lower than 70, proceed to step 2.  2. Treat with 3-4 glucose tablets or 3 packets of regular sugar. If these aren't around, you can try hard candy. Yet another option would be to drink 4 ounces of fruit juice or 6 ounces of REGULAR soda.  3. Re-check your sugar in 15 minutes. If it is still below 70, do what you did in step 2 again. If has come back up, go ahead and eat a snack or small meal at this time.

## 2019-02-06 ENCOUNTER — Encounter: Payer: Self-pay | Admitting: Pharmacist

## 2019-02-20 ENCOUNTER — Ambulatory Visit: Payer: Self-pay | Admitting: Family Medicine

## 2019-04-20 DIAGNOSIS — E1110 Type 2 diabetes mellitus with ketoacidosis without coma: Secondary | ICD-10-CM

## 2019-04-20 HISTORY — DX: Type 2 diabetes mellitus with ketoacidosis without coma: E11.10

## 2019-04-30 ENCOUNTER — Encounter (HOSPITAL_COMMUNITY): Payer: Self-pay

## 2019-04-30 ENCOUNTER — Other Ambulatory Visit: Payer: Self-pay

## 2019-04-30 ENCOUNTER — Inpatient Hospital Stay (HOSPITAL_COMMUNITY)
Admission: EM | Admit: 2019-04-30 | Discharge: 2019-05-04 | DRG: 638 | Disposition: A | Discharge status: Home / Self Care | Payer: Self-pay | Attending: Family Medicine | Admitting: Family Medicine

## 2019-04-30 DIAGNOSIS — Z833 Family history of diabetes mellitus: Secondary | ICD-10-CM

## 2019-04-30 DIAGNOSIS — N179 Acute kidney failure, unspecified: Secondary | ICD-10-CM | POA: Diagnosis present

## 2019-04-30 DIAGNOSIS — Z8249 Family history of ischemic heart disease and other diseases of the circulatory system: Secondary | ICD-10-CM

## 2019-04-30 DIAGNOSIS — E785 Hyperlipidemia, unspecified: Secondary | ICD-10-CM | POA: Diagnosis present

## 2019-04-30 DIAGNOSIS — Z823 Family history of stroke: Secondary | ICD-10-CM

## 2019-04-30 DIAGNOSIS — E876 Hypokalemia: Secondary | ICD-10-CM | POA: Diagnosis present

## 2019-04-30 DIAGNOSIS — E1165 Type 2 diabetes mellitus with hyperglycemia: Secondary | ICD-10-CM

## 2019-04-30 DIAGNOSIS — F419 Anxiety disorder, unspecified: Secondary | ICD-10-CM | POA: Diagnosis present

## 2019-04-30 DIAGNOSIS — R9431 Abnormal electrocardiogram [ECG] [EKG]: Secondary | ICD-10-CM

## 2019-04-30 DIAGNOSIS — Z20822 Contact with and (suspected) exposure to covid-19: Secondary | ICD-10-CM | POA: Diagnosis present

## 2019-04-30 DIAGNOSIS — E1010 Type 1 diabetes mellitus with ketoacidosis without coma: Principal | ICD-10-CM | POA: Diagnosis present

## 2019-04-30 DIAGNOSIS — Z6841 Body Mass Index (BMI) 40.0 and over, adult: Secondary | ICD-10-CM

## 2019-04-30 DIAGNOSIS — Z79899 Other long term (current) drug therapy: Secondary | ICD-10-CM

## 2019-04-30 DIAGNOSIS — E1110 Type 2 diabetes mellitus with ketoacidosis without coma: Secondary | ICD-10-CM | POA: Diagnosis present

## 2019-04-30 DIAGNOSIS — Z7984 Long term (current) use of oral hypoglycemic drugs: Secondary | ICD-10-CM

## 2019-04-30 DIAGNOSIS — Z832 Family history of diseases of the blood and blood-forming organs and certain disorders involving the immune mechanism: Secondary | ICD-10-CM

## 2019-04-30 DIAGNOSIS — R112 Nausea with vomiting, unspecified: Secondary | ICD-10-CM | POA: Diagnosis present

## 2019-04-30 DIAGNOSIS — E860 Dehydration: Secondary | ICD-10-CM | POA: Diagnosis present

## 2019-04-30 DIAGNOSIS — I10 Essential (primary) hypertension: Secondary | ICD-10-CM | POA: Diagnosis present

## 2019-04-30 LAB — COMPREHENSIVE METABOLIC PANEL
ALT: 17 U/L (ref 0–44)
AST: 13 U/L — ABNORMAL LOW (ref 15–41)
Albumin: 3.9 g/dL (ref 3.5–5.0)
Alkaline Phosphatase: 125 U/L (ref 38–126)
Anion gap: 25 — ABNORMAL HIGH (ref 5–15)
BUN: 7 mg/dL (ref 6–20)
CO2: 8 mmol/L — ABNORMAL LOW (ref 22–32)
Calcium: 9.1 mg/dL (ref 8.9–10.3)
Chloride: 101 mmol/L (ref 98–111)
Creatinine, Ser: 1.46 mg/dL — ABNORMAL HIGH (ref 0.61–1.24)
GFR calc Af Amer: >60 mL/min (ref >60)
GFR calc non Af Amer: >60 mL/min (ref >60)
Glucose, Bld: 302 mg/dL — ABNORMAL HIGH (ref 70–99)
Potassium: 3.8 mmol/L (ref 3.5–5.1)
Sodium: 134 mmol/L — ABNORMAL LOW (ref 135–145)
Total Bilirubin: 1.6 mg/dL — ABNORMAL HIGH (ref 0.3–1.2)
Total Protein: 10 g/dL — ABNORMAL HIGH (ref 6.5–8.1)

## 2019-04-30 LAB — BASIC METABOLIC PANEL
Anion gap: 24 — ABNORMAL HIGH (ref 5–15)
Anion gap: 17 — ABNORMAL HIGH (ref 5–15)
Anion gap: 19 — ABNORMAL HIGH (ref 5–15)
Anion gap: 16 — ABNORMAL HIGH (ref 5–15)
Anion gap: 8 (ref 5–15)
BUN: 7 mg/dL (ref 6–20)
BUN: 6 mg/dL (ref 6–20)
BUN: 5 mg/dL — ABNORMAL LOW (ref 6–20)
BUN: 5 mg/dL — ABNORMAL LOW (ref 6–20)
BUN: 5 mg/dL — ABNORMAL LOW (ref 6–20)
CO2: 9 mmol/L — ABNORMAL LOW (ref 22–32)
CO2: 9 mmol/L — ABNORMAL LOW (ref 22–32)
CO2: 11 mmol/L — ABNORMAL LOW (ref 22–32)
CO2: 10 mmol/L — ABNORMAL LOW (ref 22–32)
CO2: 13 mmol/L — ABNORMAL LOW (ref 22–32)
Calcium: 9.1 mg/dL (ref 8.9–10.3)
Calcium: 8.8 mg/dL — ABNORMAL LOW (ref 8.9–10.3)
Calcium: 9.3 mg/dL (ref 8.9–10.3)
Calcium: 9 mg/dL (ref 8.9–10.3)
Calcium: 8.9 mg/dL (ref 8.9–10.3)
Chloride: 104 mmol/L (ref 98–111)
Chloride: 110 mmol/L (ref 98–111)
Chloride: 108 mmol/L (ref 98–111)
Chloride: 110 mmol/L (ref 98–111)
Chloride: 113 mmol/L — ABNORMAL HIGH (ref 98–111)
Creatinine, Ser: 1.44 mg/dL — ABNORMAL HIGH (ref 0.61–1.24)
Creatinine, Ser: 1.35 mg/dL — ABNORMAL HIGH (ref 0.61–1.24)
Creatinine, Ser: 1.26 mg/dL — ABNORMAL HIGH (ref 0.61–1.24)
Creatinine, Ser: 1.23 mg/dL (ref 0.61–1.24)
Creatinine, Ser: 1.15 mg/dL (ref 0.61–1.24)
GFR calc Af Amer: >60 mL/min (ref >60)
GFR calc Af Amer: >60 mL/min (ref >60)
GFR calc Af Amer: >60 mL/min (ref >60)
GFR calc Af Amer: >60 mL/min (ref >60)
GFR calc Af Amer: >60 mL/min (ref >60)
GFR calc non Af Amer: >60 mL/min (ref >60)
GFR calc non Af Amer: >60 mL/min (ref >60)
GFR calc non Af Amer: >60 mL/min (ref >60)
GFR calc non Af Amer: >60 mL/min (ref >60)
GFR calc non Af Amer: >60 mL/min (ref >60)
Glucose, Bld: 290 mg/dL — ABNORMAL HIGH (ref 70–99)
Glucose, Bld: 203 mg/dL — ABNORMAL HIGH (ref 70–99)
Glucose, Bld: 192 mg/dL — ABNORMAL HIGH (ref 70–99)
Glucose, Bld: 189 mg/dL — ABNORMAL HIGH (ref 70–99)
Glucose, Bld: 192 mg/dL — ABNORMAL HIGH (ref 70–99)
Potassium: 3.9 mmol/L (ref 3.5–5.1)
Potassium: 3.4 mmol/L — ABNORMAL LOW (ref 3.5–5.1)
Potassium: 3.6 mmol/L (ref 3.5–5.1)
Potassium: 3.8 mmol/L (ref 3.5–5.1)
Potassium: 3.3 mmol/L — ABNORMAL LOW (ref 3.5–5.1)
Sodium: 137 mmol/L (ref 135–145)
Sodium: 136 mmol/L (ref 135–145)
Sodium: 138 mmol/L (ref 135–145)
Sodium: 136 mmol/L (ref 135–145)
Sodium: 134 mmol/L — ABNORMAL LOW (ref 135–145)

## 2019-04-30 LAB — CBC WITH DIFFERENTIAL/PLATELET
Abs Immature Granulocytes: 0.17 10*3/uL — ABNORMAL HIGH (ref 0.00–0.07)
Basophils Absolute: 0.1 10*3/uL (ref 0.0–0.1)
Basophils Relative: 0 %
Eosinophils Absolute: 0 10*3/uL (ref 0.0–0.5)
Eosinophils Relative: 0 %
HCT: 48.7 % (ref 39.0–52.0)
Hemoglobin: 16.1 g/dL (ref 13.0–17.0)
Immature Granulocytes: 1 %
Lymphocytes Relative: 11 %
Lymphs Abs: 1.8 10*3/uL (ref 0.7–4.0)
MCH: 29.8 pg (ref 26.0–34.0)
MCHC: 33.1 g/dL (ref 30.0–36.0)
MCV: 90.2 fL (ref 80.0–100.0)
Monocytes Absolute: 0.8 10*3/uL (ref 0.1–1.0)
Monocytes Relative: 5 %
Neutro Abs: 13.5 10*3/uL — ABNORMAL HIGH (ref 1.7–7.7)
Neutrophils Relative %: 83 %
Platelets: 212 10*3/uL (ref 150–400)
RBC: 5.4 MIL/uL (ref 4.22–5.81)
RDW: 12.8 % (ref 11.5–15.5)
WBC: 16.3 10*3/uL — ABNORMAL HIGH (ref 4.0–10.5)
nRBC: 0 % (ref 0.0–0.2)

## 2019-04-30 LAB — POCT I-STAT EG7
Acid-base deficit: 21 mmol/L — ABNORMAL HIGH (ref 0.0–2.0)
Bicarbonate: 8.6 mmol/L — ABNORMAL LOW (ref 20.0–28.0)
Calcium, Ion: 1.23 mmol/L (ref 1.15–1.40)
HCT: 51 % (ref 39.0–52.0)
Hemoglobin: 17.3 g/dL — ABNORMAL HIGH (ref 13.0–17.0)
O2 Saturation: 99 %
Potassium: 3.6 mmol/L (ref 3.5–5.1)
Sodium: 137 mmol/L (ref 135–145)
TCO2: 10 mmol/L — ABNORMAL LOW (ref 22–32)
pCO2, Ven: 31.2 mmHg — ABNORMAL LOW (ref 44.0–60.0)
pH, Ven: 7.047 — CL (ref 7.250–7.430)
pO2, Ven: 202 mmHg — ABNORMAL HIGH (ref 32.0–45.0)

## 2019-04-30 LAB — MAGNESIUM: Magnesium: 1.9 mg/dL (ref 1.7–2.4)

## 2019-04-30 LAB — GLUCOSE, CAPILLARY
Glucose-Capillary: 159 mg/dL — ABNORMAL HIGH (ref 70–99)
Glucose-Capillary: 183 mg/dL — ABNORMAL HIGH (ref 70–99)
Glucose-Capillary: 174 mg/dL — ABNORMAL HIGH (ref 70–99)
Glucose-Capillary: 173 mg/dL — ABNORMAL HIGH (ref 70–99)
Glucose-Capillary: 179 mg/dL — ABNORMAL HIGH (ref 70–99)
Glucose-Capillary: 180 mg/dL — ABNORMAL HIGH (ref 70–99)
Glucose-Capillary: 173 mg/dL — ABNORMAL HIGH (ref 70–99)
Glucose-Capillary: 166 mg/dL — ABNORMAL HIGH (ref 70–99)
Glucose-Capillary: 208 mg/dL — ABNORMAL HIGH (ref 70–99)
Glucose-Capillary: 185 mg/dL — ABNORMAL HIGH (ref 70–99)
Glucose-Capillary: 182 mg/dL — ABNORMAL HIGH (ref 70–99)
Glucose-Capillary: 172 mg/dL — ABNORMAL HIGH (ref 70–99)

## 2019-04-30 LAB — URINALYSIS, ROUTINE W REFLEX MICROSCOPIC
Bacteria, UA: NONE SEEN
Bilirubin Urine: NEGATIVE
Glucose, UA: >=500 mg/dL — AB
Ketones, ur: 80 mg/dL — AB
Leukocytes,Ua: NEGATIVE
Nitrite: NEGATIVE
Protein, ur: 100 mg/dL — AB
Specific Gravity, Urine: 1.02 (ref 1.005–1.030)
pH: 5 (ref 5.0–8.0)

## 2019-04-30 LAB — RESPIRATORY PANEL BY RT PCR (FLU A&B, COVID)
Influenza A by PCR: NEGATIVE
Influenza B by PCR: NEGATIVE
SARS Coronavirus 2 by RT PCR: NEGATIVE

## 2019-04-30 LAB — CBG MONITORING, ED
Glucose-Capillary: 306 mg/dL — ABNORMAL HIGH (ref 70–99)
Glucose-Capillary: 253 mg/dL — ABNORMAL HIGH (ref 70–99)
Glucose-Capillary: 231 mg/dL — ABNORMAL HIGH (ref 70–99)
Glucose-Capillary: 186 mg/dL — ABNORMAL HIGH (ref 70–99)
Glucose-Capillary: 175 mg/dL — ABNORMAL HIGH (ref 70–99)

## 2019-04-30 LAB — MRSA PCR SCREENING: MRSA by PCR: NEGATIVE

## 2019-04-30 LAB — BETA-HYDROXYBUTYRIC ACID: Beta-Hydroxybutyric Acid: >8 mmol/L — ABNORMAL HIGH (ref 0.05–0.27)

## 2019-04-30 LAB — LIPASE, BLOOD: Lipase: 33 U/L (ref 11–51)

## 2019-04-30 MED ORDER — LOSARTAN POTASSIUM 50 MG PO TABS: 100.0000 mg | ORAL_TABLET | Freq: Every day | ORAL | Status: DC

## 2019-04-30 MED ORDER — INSULIN ASPART 100 UNIT/ML ~~LOC~~ SOLN: 5.0000 [IU] | Freq: Three times a day (TID) | SUBCUTANEOUS | Status: AC

## 2019-04-30 MED ORDER — INSULIN REGULAR(HUMAN) IN NACL 100-0.9 UT/100ML-% IV SOLN: INTRAVENOUS | Status: DC

## 2019-04-30 MED ORDER — HYDRALAZINE HCL 20 MG/ML IJ SOLN: 10.0000 mg | Freq: Four times a day (QID) | INTRAMUSCULAR | Status: DC | PRN

## 2019-04-30 MED ORDER — HYDRALAZINE HCL 20 MG/ML IJ SOLN: 5.0000 mg | Freq: Four times a day (QID) | INTRAMUSCULAR | Status: DC | PRN

## 2019-04-30 MED ORDER — LORAZEPAM 2 MG/ML IJ SOLN
1.0000 mg | Freq: Once | INTRAMUSCULAR | Status: AC
  Administered 2019-04-30: 1 mg via INTRAVENOUS
  Filled 2019-04-30: qty 1

## 2019-04-30 MED ORDER — SODIUM CHLORIDE 0.9 % IV SOLN: INTRAVENOUS | Status: DC

## 2019-04-30 MED ORDER — SODIUM CHLORIDE 0.9 % IV BOLUS
1000.0000 mL | Freq: Once | INTRAVENOUS | Status: AC
  Administered 2019-04-30: 1000 mL via INTRAVENOUS

## 2019-04-30 MED ORDER — VENLAFAXINE HCL ER 75 MG PO CP24
75.0000 mg | ORAL_CAPSULE | Freq: Every day | ORAL | Status: DC
  Administered 2019-05-01 – 2019-05-04 (×4): 75 mg via ORAL
  Filled 2019-04-30 (×4): qty 1

## 2019-04-30 MED ORDER — LACTATED RINGERS IV BOLUS: 1000.0000 mL | Freq: Once | INTRAVENOUS | Status: DC

## 2019-04-30 MED ORDER — DEXTROSE 50 % IV SOLN: 0.0000 mL | INTRAVENOUS | Status: DC | PRN

## 2019-04-30 MED ORDER — POTASSIUM CHLORIDE 10 MEQ/100ML IV SOLN
10.0000 meq | INTRAVENOUS | Status: AC
  Administered 2019-04-30 (×4): 10 meq via INTRAVENOUS
  Filled 2019-04-30 (×4): qty 100

## 2019-04-30 MED ORDER — INSULIN REGULAR(HUMAN) IN NACL 100-0.9 UT/100ML-% IV SOLN
INTRAVENOUS | Status: DC
  Administered 2019-04-30: 11.5 [IU]/h via INTRAVENOUS
  Administered 2019-04-30: 7 [IU]/h via INTRAVENOUS
  Administered 2019-05-01: 8 [IU]/h via INTRAVENOUS
  Administered 2019-05-02: 12 [IU]/h via INTRAVENOUS
  Administered 2019-05-02: 4.6 [IU]/h via INTRAVENOUS
  Filled 2019-04-30 (×5): qty 100

## 2019-04-30 MED ORDER — PROCHLORPERAZINE EDISYLATE 10 MG/2ML IJ SOLN
10.0000 mg | Freq: Four times a day (QID) | INTRAMUSCULAR | Status: DC | PRN
  Filled 2019-04-30: qty 2

## 2019-04-30 MED ORDER — DEXTROSE-NACL 5-0.45 % IV SOLN: INTRAVENOUS | Status: DC

## 2019-04-30 MED ORDER — POTASSIUM CHLORIDE 10 MEQ/100ML IV SOLN
10.0000 meq | INTRAVENOUS | Status: AC
  Administered 2019-04-30 (×4): 10 meq via INTRAVENOUS
  Filled 2019-04-30 (×7): qty 100

## 2019-04-30 MED ORDER — ENOXAPARIN SODIUM 40 MG/0.4ML ~~LOC~~ SOLN
40.0000 mg | Freq: Every day | SUBCUTANEOUS | Status: DC
  Filled 2019-04-30: qty 0.4

## 2019-04-30 MED ORDER — POTASSIUM CHLORIDE 10 MEQ/100ML IV SOLN
10.0000 meq | INTRAVENOUS | Status: AC
  Administered 2019-04-30 (×2): 10 meq via INTRAVENOUS
  Filled 2019-04-30 (×2): qty 100

## 2019-04-30 MED ORDER — ATORVASTATIN CALCIUM 10 MG PO TABS
20.0000 mg | ORAL_TABLET | Freq: Every day | ORAL | Status: DC
  Administered 2019-05-01 – 2019-05-04 (×4): 20 mg via ORAL
  Filled 2019-04-30 (×4): qty 2

## 2019-04-30 MED ORDER — LACTATED RINGERS IV BOLUS: 1000.0000 mL | INTRAVENOUS | Status: DC

## 2019-04-30 MED ORDER — LACTATED RINGERS IV BOLUS
1000.0000 mL | Freq: Once | INTRAVENOUS | Status: AC
  Administered 2019-04-30: 1000 mL via INTRAVENOUS

## 2019-04-30 NOTE — ED Provider Notes (Signed)
[facility] EMERGENCY DEPARTMENT Provider Note   CSN: [id number] Arrival date & time: 04/30/19  0349     History Chief Complaint  Patient presents with  . Emesis    Dylan Watts is a 29 y.o. male the past medical history of morbid obesity, diabetes who presents emergency department with a chief complaint of vomiting.  Patient states that he has had high blood sugars and vomiting uncontrollably for the past 3 days.  He has not even been able to hold down fluids.  He states he had something like this about a year ago and was hospitalized.  Patient denies any chest pain, abdominal pain, diarrhea, fevers.  Patient has not taken his metformin in almost 3 weeks. He ran out of Glipizide 3 days ago.  HPI     Past Medical History:  Diagnosis Date  . Diabetes mellitus without complication [facility])     Patient Active Problem List   Diagnosis Date Noted  . Obesity, Class III, BMI 40-49.9 (morbid obesity) ([place]) 09/08/2017  . Leukocytosis 09/08/2017  . AKI (acute kidney injury) ([place]) 09/08/2017  . Hypokalemia 09/08/2017  . Hypophosphatemia 09/08/2017  . Diabetic ketoacidosis ([place]) 09/07/2017    Past Surgical History:  Procedure Laterality Date  . NO PAST SURGERIES         Family History  Problem Relation Age of Onset  . Diabetes Mother   . Diabetes Father   . Lupus Father   . Diabetes Maternal Grandmother   . Hypertension Maternal Grandmother   . Diabetes Paternal Grandmother   . Stroke Paternal Grandmother   . Hypertension Paternal Grandmother     Social History   Tobacco Use  . Smoking status: Never Smoker  . Smokeless tobacco: Never Used  Substance Use Topics  . Alcohol use: Yes    Comment: socially  . Drug use: No    Home Medications Prior to Admission medications   Medication Sig Start Date End Date Taking? Authorizing Provider  Aspirin-Salicylamide-Caffeine (BC HEADACHE POWDER PO) Take 1 Package by mouth as needed (headache).    [provider]  atorvastatin (LIPITOR) 20 MG tablet Take 1 tablet (20 mg total) by mouth daily. 02/05/19   Fulp, Cammie, MD  blood glucose meter kit and supplies KIT Dispense based on patient and insurance preference. Use up to four times daily as directed. (FOR ICD-9 250.00, 250.01). 09/12/17   Kayleen Memos, DO  brompheniramine-pseudoephedrine-DM 30-2-10 MG/5ML syrup Take 5 mLs by mouth 4 (four) times daily as needed. 05/28/18   Wieters, Hallie C, PA-C  fluticasone (FLONASE) 50 MCG/ACT nasal spray Place 1-2 sprays into both nostrils daily for 7 days. Patient not taking: Reported on 01/22/2019 05/28/18 06/04/18  Wieters, Hallie C, PA-C  glipiZIDE (GLUCOTROL) 5 MG tablet Take 1 tablet (5 mg total) by mouth 2 (two) times daily before a meal. 01/22/19   Fulp, Cammie, MD  ibuprofen (ADVIL,MOTRIN) 800 MG tablet Take 1 tablet (800 mg total) by mouth 3 (three) times daily. 05/28/18   Wieters, Hallie C, PA-C  losartan (COZAAR) 100 MG tablet Take 1 tablet (100 mg total) by mouth daily. 02/05/19   Fulp, Cammie, MD  metFORMIN (GLUCOPHAGE) 1000 MG tablet Take 1 tab po every morning for 1 week. After 1 week, may increase to 1 tab po BID. 02/05/19   Fulp, Cammie, MD  metoprolol tartrate (LOPRESSOR) 25 MG tablet Take 1 tablet (25 mg total) by mouth 2 (two) times daily. Patient not taking: Reported on 11/29/2017 09/12/17  Irene Pap N, DO  venlafaxine XR (EFFEXOR-XR) 75 MG 24 hr capsule Take 1 capsule (75 mg total) by mouth daily with breakfast. To help with anxiety 01/22/19   Antony Blackbird, MD    Allergies    Patient has no known allergies.  Review of Systems   Review of Systems Ten systems reviewed and are negative for acute change, except as noted in the HPI.   Physical Exam Updated Vital Signs BP (!) 156/107 (BP Location: Right Arm)   Pulse (!) 136   Temp 97.8 F (36.6 C) (Oral)   Resp (!) 22   Ht 6' (1.829 m)   Wt (!) 150 kg   SpO2 98%   BMI 44.85 kg/m   Physical Exam Vitals and nursing note  reviewed.  Constitutional:      General: He is not in acute distress.    Appearance: He is well-developed. He is obese. He is not diaphoretic.  HENT:     Head: Normocephalic and atraumatic.  Eyes:     General: No scleral icterus.    Conjunctiva/sclera: Conjunctivae normal.  Cardiovascular:     Rate and Rhythm: Normal rate and regular rhythm.     Heart sounds: Normal heart sounds.  Pulmonary:     Effort: Tachypnea present. No respiratory distress.     Breath sounds: Normal breath sounds.  Abdominal:     Palpations: Abdomen is soft.     Tenderness: There is no abdominal tenderness.  Musculoskeletal:     Cervical back: Normal range of motion and neck supple.  Skin:    General: Skin is warm and dry.  Neurological:     Mental Status: He is alert.  Psychiatric:        Behavior: Behavior normal.     ED Results / Procedures / Treatments   Labs (all labs ordered are listed, but only abnormal results are displayed) Labs Reviewed  CBG MONITORING, ED - Abnormal; Notable for the following components:      Result Value   Glucose-Capillary 306 (*)    All other components within normal limits  MAGNESIUM    EKG None  Radiology No results found.  Procedures .Critical Care Performed by: Margarita Mail, PA-C Authorized by: Margarita Mail, PA-C   Critical care provider statement:    Critical care time (minutes):  52   Critical care was necessary to treat or prevent imminent or life-threatening deterioration of the following conditions:  Metabolic crisis   Critical care was time spent personally by me on the following activities:  Discussions with consultants, evaluation of patient's response to treatment, examination of patient, ordering and performing treatments and interventions, ordering and review of laboratory studies, ordering and review of radiographic studies, pulse oximetry, re-evaluation of patient's condition, obtaining history from patient or surrogate and review of old  charts   (including critical care time)  Medications Ordered in ED Medications - No data to display  ED Course  I have reviewed the triage vital signs and the nursing notes.  Pertinent labs & imaging results that were available during my care of the patient were reviewed by me and considered in my medical decision making (see chart for details).  Clinical Course as of Apr 29 545  Thu Apr 30, 2019  0535 CO2(!): 8 [AH]    Clinical Course User Index [AH] Margarita Mail, PA-C   MDM Rules/Calculators/A&P                      ER:QSXQK  and vomiting VS:  Vitals:   04/30/19 0406 04/30/19 0430 04/30/19 0500 04/30/19 0530  BP: (!) 156/107 (!) 152/116 (!) 148/107 (!) 153/100  Pulse: (!) 136  (!) 127 (!) 115  Resp: (!) 22 (!) 22 (!) 30 (!) 25  Temp: 97.8 F (36.6 C)     TempSrc: Oral     SpO2: 98%  92% 100%  Weight:      Height:       PN:DLOPRAF is gathered by patient  and EMR. DDX:The differential diagnosis for Nausea is large and requires complex medical decision making. The emergent differential diagnosis for vomiting includes, but is not limited to ACS/MI, Boerhaave's, DKA, Intracranial Hemorrhage, Ischemic bowel, Meningitis, Sepsis, Acute radiation syndrome, Acute gastric dilation, Adrenal insufficiency, Bowel obstruction/ileus,  Cholecystitis,  Electrolyte abnormalities, Testicular torsion/ovarian torsion,  Biliary colic, Cannabinoid hyperemesis syndrome, Gastritis, Gastroenteritis, Gastroparesis, Hepatitis, Ibuprofen, Labyrinthitis, Migraine, Motion sickness, Narcotic withdrawal, Thyroid,  Peptic ulcer disease, Renal colic, and UTI Labs: I reviewed the labs which show CBG of 306.  Normal mag level.  CMP shows mild hyponatremia in the setting of elevated blood glucose.  His bicarb is less than 8.  He has an AKI with creatinine of 1.46 up from 0.8.  He has an anion gap of 25.  Patient's white blood cell count is elevated at 16.3.  His VBG shows a pH of 7.047, urine  pending. Imaging: EKG: Sinus tachycardia at a rate of 128 with significantly prolonged QT value  OAD:[id number] here with vomiting. Patient has obvious DKA.  Receiving fluids.  Placed on insulin drip.  Has not been taking his medications.  No evidence of infection. Patient disposition:Admit Patient condition: . The patient appears reasonably stabilized for admission considering the current resources, flow, and capabilities available in the ED at this time, and I doubt any other [facility] requiring further screening and/or treatment in the ED prior to admission.  Final Clinical Impression(s) / ED Diagnoses Final diagnoses:  Diabetic ketoacidosis without coma associated with type 2 diabetes mellitus ([place])  Long QT interval    Rx / DC Orders ED Discharge Orders    None       Margarita Mail, PA-C 04/30/19 [place], April, MD 04/30/19 [phone]

## 2019-04-30 NOTE — Progress Notes (Addendum)
Inpatient Diabetes Program Recommendations  AACE/ADA: New Consensus Statement on Inpatient Glycemic Control (2015)  Target Ranges:  Prepandial:   less than 140 mg/dL      Peak postprandial:   less than 180 mg/dL (1-2 hours)      Critically ill patients:  140 - 180 mg/dL   Lab Results  Component Value Date   GLUCAP 186 (H) 04/30/2019   HGBA1C 11.8 (A) 01/22/2019    Review of Glycemic Control Results for Dylan Watts, Dylan Watts (MRN [id number]) as of 04/30/2019 10:02  Ref. Range 04/30/2019 04:39 04/30/2019 06:40  Glucose Latest Ref Range: 70 - 99 mg/dL 094 (H) 076 (H)   Diabetes history: DM 1 on H&P however he was prescribed only oral agents prior to admission. Outpatient Diabetes medications:  Glucotrol 5 mg bid, Metformin 1000 mg daily Current orders for Inpatient glycemic control:  IV insulin/DKA order set  Inpatient Diabetes Program Recommendations:    Note patient admitted with DKA.  Last visit to MD 01/2019, patient asked to try "oral agents" only (however he had been out of medications for >4 months).  He was started on insulin SQ in 2019.   ? Whether patient is producing insulin.  May want to check a c-peptide and for antibodies as his presentation reflects insulin deficiency.    He will likely need to be restarted on basal/bolus regimen at d/c.  Will follow.   Thanks,  Beryl Meager, RN, BC-ADM Inpatient Diabetes Coordinator Pager [phone] (8a-5p)

## 2019-04-30 NOTE — ED Triage Notes (Signed)
Patient report vomiting for 18 hours.  States he is unable to keep anything down.  Also reports fatigue and headache

## 2019-04-30 NOTE — Progress Notes (Signed)
Brief post rounds update note  29 year old male with diabetes mellitus (unclear if this is type I or type II though patient reports history of type 2 diabetes), morbid obesity, hypertension presented with nausea/vomiting and found to be in DKA.  Continue insulin drip and IV fluids.  Additional 2 L IV fluid bolus given.  Replace electrolytes.  Continue BMP every 4.  His presenting symptoms have resolved and he is requesting to eat.  Started on diabetic diet.  Will give 5 units short acting insulin to cover him for the meal if he eats more than 50% of his meal.  He was on oral agents Metformin and glipizide at home for the last year but reports being on insulin for about 6 months 1 or 2 years ago.  Ordered islet cell antibody, CAD, C-peptide to help delineate type of diabetes.  Reviewed chart.  Patient seen and examined.  Updated patient and mother at bedside on plan of care.  Raford Pitcher, MD Internal Medicine  Hospitalist

## 2019-04-30 NOTE — H&P (Addendum)
TRH H&P    Patient Demographics:    Dylan Watts, is a 29 y.o. male  MRN: [id number]  DOB - 1991-02-04  Admit Date - 04/30/2019  Referring MD/NP/PA: Margarita Mail  Outpatient Primary MD for the patient is Antony Blackbird, MD  Patient coming from:  home  Chief complaint- n/v   HPI:    Dylan Watts  is a 29 y.o. male,  w hypertension, hyperlipidemia, Dm1, apparently on metformin stopped about a week ago due to upset stomach. Pt notes yesterday started to have n/v, slight abdominal discomfort with emesis, and therefore presented to ED.  Pt denies fever, chills, cough, cp, palp, sob,  diarrhea, brbpr, black stool, dysuria, hematuria.   In ED,   Na 134, K 3.8, Bun 7, Creatinine 1.46 Glucose 302, Hco3 8, AG 25  Wbc 16.3, Hgb 16.1, Plt 212 Ast  13, Alt 17 Lipase 33 Urinalysis + ketones   Pt will be admitted for n/v secondary to DKA    Review of systems:    In addition to the HPI above,  No Fever-chills, No Headache, No changes with Vision or hearing, No problems swallowing food or Liquids, No Chest pain, Cough or Shortness of Breath,  bowel movements are regular, No Blood in stool or Urine, No dysuria, No new skin rashes or bruises, No new joints pains-aches,  No new weakness, tingling, numbness in any extremity, No recent weight gain or loss, No polyuria, polydypsia or polyphagia, No significant Mental Stressors.  All other systems reviewed and are negative.    Past History of the following :    Past Medical History:  Diagnosis Date  . Diabetes mellitus without complication [facility])       Past Surgical History:  Procedure Laterality Date  . NO PAST SURGERIES        Social History:      Social History   Tobacco Use  . Smoking status: Never Smoker  . Smokeless tobacco: Never Used  Substance Use Topics  . Alcohol use: Yes    Comment: socially       Family History :       Family History  Problem Relation Age of Onset  . Diabetes Mother   . Diabetes Father   . Lupus Father   . Diabetes Maternal Grandmother   . Hypertension Maternal Grandmother   . Diabetes Paternal Grandmother   . Stroke Paternal Grandmother   . Hypertension Paternal Grandmother        Home Medications:   Prior to Admission medications   Medication Sig Start Date End Date Taking? Authorizing Provider  atorvastatin (LIPITOR) 20 MG tablet Take 1 tablet (20 mg total) by mouth daily. 02/05/19  Yes Fulp, Cammie, MD  glipiZIDE (GLUCOTROL) 5 MG tablet Take 1 tablet (5 mg total) by mouth 2 (two) times daily before a meal. 01/22/19  Yes Fulp, Cammie, MD  losartan (COZAAR) 100 MG tablet Take 1 tablet (100 mg total) by mouth daily. 02/05/19  Yes Fulp, Cammie, MD  Multiple Vitamins-Minerals (MULTIVITAMIN WITH MINERALS) tablet Take 1 tablet  by mouth daily.   Yes [provider]  venlafaxine XR (EFFEXOR-XR) 75 MG 24 hr capsule Take 1 capsule (75 mg total) by mouth daily with breakfast. To help with anxiety 01/22/19  Yes Fulp, Cammie, MD  vitamin E (VITAMIN E) 180 MG (400 UNITS) capsule Take 400 Units by mouth daily.   Yes [provider]  blood glucose meter kit and supplies KIT Dispense based on patient and insurance preference. Use up to four times daily as directed. (FOR ICD-9 250.00, 250.01). 09/12/17   Kayleen Memos, DO  metFORMIN (GLUCOPHAGE) 1000 MG tablet Take 1 tab po every morning for 1 week. After 1 week, may increase to 1 tab po BID. Patient not taking: Reported on 04/30/2019 02/05/19   Fulp, Ander Gaster, MD  metoprolol tartrate (LOPRESSOR) 25 MG tablet Take 1 tablet (25 mg total) by mouth 2 (two) times daily. Patient not taking: Reported on 11/29/2017 09/12/17   Kayleen Memos, DO     Allergies:    No Known Allergies   Physical Exam:   Vitals  Blood pressure (!) 133/94, pulse (!) 120, temperature 97.8 F (36.6 C), temperature source Oral, resp. rate (!) 25, height 6'  (1.829 m), weight (!) 150 kg, SpO2 100 %.  1.  General: axoxo3  2. Psychiatric: euthymic  3. Neurologic: nonfocal  4. HEENMT:  Anicteric  5. Respiratory : CTAB  6. Cardiovascular : Tachy s1, s2, no m/g/r  7. Gastrointestinal:  Abd: soft, nt, nd, +bs  8. Skin:  Ext: no c/c/e, no rash  9.Musculoskeletal:  Good ROM     Data Review:    CBC Recent Labs  Lab 04/30/19 0439 04/30/19 0544  WBC 16.3*  --   HGB 16.1 17.3*  HCT 48.7 51.0  PLT 212  --   MCV 90.2  --   MCH 29.8  --   MCHC 33.1  --   RDW 12.8  --   LYMPHSABS 1.8  --   MONOABS 0.8  --   EOSABS 0.0  --   BASOSABS 0.1  --    ------------------------------------------------------------------------------------------------------------------  Results for orders placed or performed during the hospital encounter of 04/30/19 (from the past 48 hour(s))  Magnesium     Status: None   Collection Time: 04/30/19  4:23 AM  Result Value Ref Range   Magnesium 1.9 1.7 - 2.4 mg/dL    Comment: Performed at [facility], [place] [address], [place], [place] [address]  CBG monitoring, ED     Status: Abnormal   Collection Time: 04/30/19  4:24 AM  Result Value Ref Range   Glucose-Capillary 306 (H) 70 - 99 mg/dL  CBC with Differential/Platelet     Status: Abnormal   Collection Time: 04/30/19  4:39 AM  Result Value Ref Range   WBC 16.3 (H) 4.0 - 10.5 K/uL   RBC 5.40 4.22 - 5.81 MIL/uL   Hemoglobin 16.1 13.0 - 17.0 g/dL   HCT 48.7 39.0 - 52.0 %   MCV 90.2 80.0 - 100.0 fL   MCH 29.8 26.0 - 34.0 pg   MCHC 33.1 30.0 - 36.0 g/dL   RDW 12.8 11.5 - 15.5 %   Platelets 212 150 - 400 K/uL   nRBC 0.0 0.0 - 0.2 %   Neutrophils Relative % 83 %   Neutro Abs 13.5 (H) 1.7 - 7.7 K/uL   Lymphocytes Relative 11 %   Lymphs Abs 1.8 0.7 - 4.0 K/uL   Monocytes Relative 5 %   Monocytes Absolute  0.8 0.1 - 1.0 K/uL   Eosinophils Relative 0 %   Eosinophils Absolute 0.0 0.0 - 0.5 K/uL   Basophils Relative 0 %   Basophils Absolute 0.1  0.0 - 0.1 K/uL   Immature Granulocytes 1 %   Abs Immature Granulocytes 0.17 (H) 0.00 - 0.07 K/uL    Comment: Performed at [facility] [address], [place], [place] [address]  Comprehensive metabolic panel     Status: Abnormal   Collection Time: 04/30/19  4:39 AM  Result Value Ref Range   Sodium 134 (L) 135 - 145 mmol/L   Potassium 3.8 3.5 - 5.1 mmol/L   Chloride 101 98 - 111 mmol/L   CO2 8 (L) 22 - 32 mmol/L   Glucose, Bld 302 (H) 70 - 99 mg/dL   BUN 7 6 - 20 mg/dL   Creatinine, Ser 1.46 (H) 0.61 - 1.24 mg/dL   Calcium 9.1 8.9 - 10.3 mg/dL   Total Protein 10.0 (H) 6.5 - 8.1 g/dL   Albumin 3.9 3.5 - 5.0 g/dL   AST 13 (L) 15 - 41 U/L   ALT 17 0 - 44 U/L   Alkaline Phosphatase 125 38 - 126 U/L   Total Bilirubin 1.6 (H) 0.3 - 1.2 mg/dL   GFR calc non Af Amer >60 >60 mL/min   GFR calc Af Amer >60 >60 mL/min   Anion gap 25 (H) 5 - 15    Comment: Performed at [facility], [place] [address], [place], [place] [address]  Lipase, blood     Status: None   Collection Time: 04/30/19  4:39 AM  Result Value Ref Range   Lipase 33 11 - 51 U/L    Comment: Performed at [facility] [address], [place],  [address]  Urinalysis, Routine w reflex microscopic     Status: Abnormal   Collection Time: 04/30/19  4:40 AM  Result Value Ref Range   Color, Urine STRAW (A) YELLOW   APPearance CLEAR CLEAR   Specific Gravity, Urine 1.020 1.005 - 1.030   pH 5.0 5.0 - 8.0   Glucose, UA >=500 (A) NEGATIVE mg/dL   Hgb urine dipstick SMALL (A) NEGATIVE   Bilirubin Urine NEGATIVE NEGATIVE   Ketones, ur 80 (A) NEGATIVE mg/dL   Protein, ur 100 (A) NEGATIVE mg/dL   Nitrite NEGATIVE NEGATIVE   Leukocytes,Ua NEGATIVE NEGATIVE   RBC / HPF 0-5 0 - 5 RBC/hpf   WBC, UA 0-5 0 - 5 WBC/hpf   Bacteria, UA NONE SEEN NONE SEEN   Squamous Epithelial / LPF 0-5 0 - 5   Mucus PRESENT    Hyaline Casts, UA PRESENT    Non Squamous Epithelial 0-5 (A) NONE SEEN    Comment: Performed at [facility], [place] [address], [place],  [address]  POCT I-Stat EG7     Status: Abnormal   Collection Time: 04/30/19  5:44 AM  Result Value Ref Range   pH, Ven 7.047 (LL) 7.250 - 7.430   pCO2, Ven 31.2 (L) 44.0 - 60.0 mmHg   pO2, Ven 202.0 (H) 32.0 - 45.0 mmHg   Bicarbonate 8.6 (L) 20.0 - 28.0 mmol/L   TCO2 10 (L) 22 - 32 mmol/L   O2 Saturation 99.0 %   Acid-base deficit 21.0 (H) 0.0 - 2.0 mmol/L   Sodium 137 135 - 145 mmol/L   Potassium 3.6 3.5 - 5.1 mmol/L   Calcium, Ion 1.23 1.15 - 1.40 mmol/L   HCT  51.0 39.0 - 52.0 %   Hemoglobin 17.3 (H) 13.0 - 17.0 g/dL   Patient temperature HIDE    Sample type VENOUS    Comment NOTIFIED PHYSICIAN     Chemistries  Recent Labs  Lab 04/30/19 0423 04/30/19 0439 04/30/19 0544  NA  --  134* 137  K  --  3.8 3.6  CL  --  101  --   CO2  --  8*  --   GLUCOSE  --  302*  --   BUN  --  7  --   CREATININE  --  1.46*  --   CALCIUM  --  9.1  --   MG 1.9  --   --   AST  --  13*  --   ALT  --  17  --   ALKPHOS  --  125  --   BILITOT  --  1.6*  --    ------------------------------------------------------------------------------------------------------------------  ------------------------------------------------------------------------------------------------------------------ GFR: Estimated Creatinine Clearance: 112.6 mL/min (A) (by C-G formula based on SCr of 1.46 mg/dL (H)). Liver Function Tests: Recent Labs  Lab 04/30/19 0439  AST 13*  ALT 17  ALKPHOS 125  BILITOT 1.6*  PROT 10.0*  ALBUMIN 3.9   Recent Labs  Lab 04/30/19 0439  LIPASE 33   No results for input(s): AMMONIA in the last 168 hours. Coagulation Profile: No results for input(s): INR, PROTIME in the last 168 hours. Cardiac Enzymes: No results for input(s): CKTOTAL, CKMB, CKMBINDEX, TROPONINI in the last 168 hours. BNP (last 3 results) No results for input(s): PROBNP in the last 8760 hours. HbA1C: No results for input(s): HGBA1C in the last 72  hours. CBG: Recent Labs  Lab 04/30/19 0424  GLUCAP 306*   Lipid Profile: No results for input(s): CHOL, HDL, LDLCALC, TRIG, CHOLHDL, LDLDIRECT in the last 72 hours. Thyroid Function Tests: No results for input(s): TSH, T4TOTAL, FREET4, T3FREE, THYROIDAB in the last 72 hours. Anemia Panel: No results for input(s): VITAMINB12, FOLATE, FERRITIN, TIBC, IRON, RETICCTPCT in the last 72 hours.  --------------------------------------------------------------------------------------------------------------- Urine analysis:    Component Value Date/Time   COLORURINE STRAW (A) 04/30/2019 0440   APPEARANCEUR CLEAR 04/30/2019 0440   LABSPEC 1.020 04/30/2019 0440   PHURINE 5.0 04/30/2019 0440   GLUCOSEU >=500 (A) 04/30/2019 0440   HGBUR SMALL (A) 04/30/2019 0440   BILIRUBINUR NEGATIVE 04/30/2019 0440   KETONESUR 80 (A) 04/30/2019 0440   PROTEINUR 100 (A) 04/30/2019 0440   UROBILINOGEN 0.2 08/14/2016 1453   NITRITE NEGATIVE 04/30/2019 0440   LEUKOCYTESUR NEGATIVE 04/30/2019 0440      Imaging Results:    No results found.  ekg st at 130, nl axis, poor R progression, no st-t changes c/w ischemia   Assessment & Plan:    Principal Problem:   DKA (diabetic ketoacidoses) (HCC) Active Problems:   Hyperlipidemia  DKA NPO Ns iv  Insulin iv Check bmp q4h x5 Transition to [place] insulin once AG and Hco3 normalize  N/v Compazine '10mg'$  iv q6h prn   Hyperlipidemia Cont Lipitor '20mg'$  po qhs  Hypertension Cont Losartan '100mg'$  po qday Hydralazine '10mg'$  iv q6h prn sbp >160  DM1 STOP Metformin  STOP Glipizide  Anxiety/ Depression Cont Effexor XR '75mg'$  po qday,  Note that effexor is associate with high bp esp at higher doses    DVT Prophylaxis-   Lovenox - SCDs  AM Labs Ordered, also please review Full Orders  Family Communication: Admission, patients condition and plan of care including tests being ordered have been discussed  with the patient  who indicate understanding and agree with  the plan and Code Status.  Code Status:  FULL CODE per patient   Admission status: Inpatient: Based on patients clinical presentation and evaluation of above clinical data, I have made determination that patient meets Inpatient criteria at this time.    Time spent in minutes : 60 minutes critical care   Jani Gravel M.D on 04/30/2019 at 6:46 AM

## 2019-05-01 LAB — GLUCOSE, CAPILLARY
Glucose-Capillary: 131 mg/dL — ABNORMAL HIGH (ref 70–99)
Glucose-Capillary: 154 mg/dL — ABNORMAL HIGH (ref 70–99)
Glucose-Capillary: 163 mg/dL — ABNORMAL HIGH (ref 70–99)
Glucose-Capillary: 171 mg/dL — ABNORMAL HIGH (ref 70–99)
Glucose-Capillary: 171 mg/dL — ABNORMAL HIGH (ref 70–99)
Glucose-Capillary: 171 mg/dL — ABNORMAL HIGH (ref 70–99)
Glucose-Capillary: 177 mg/dL — ABNORMAL HIGH (ref 70–99)
Glucose-Capillary: 177 mg/dL — ABNORMAL HIGH (ref 70–99)
Glucose-Capillary: 179 mg/dL — ABNORMAL HIGH (ref 70–99)
Glucose-Capillary: 181 mg/dL — ABNORMAL HIGH (ref 70–99)
Glucose-Capillary: 183 mg/dL — ABNORMAL HIGH (ref 70–99)
Glucose-Capillary: 193 mg/dL — ABNORMAL HIGH (ref 70–99)
Glucose-Capillary: 193 mg/dL — ABNORMAL HIGH (ref 70–99)
Glucose-Capillary: 199 mg/dL — ABNORMAL HIGH (ref 70–99)
Glucose-Capillary: 199 mg/dL — ABNORMAL HIGH (ref 70–99)
Glucose-Capillary: 201 mg/dL — ABNORMAL HIGH (ref 70–99)
Glucose-Capillary: 205 mg/dL — ABNORMAL HIGH (ref 70–99)
Glucose-Capillary: 223 mg/dL — ABNORMAL HIGH (ref 70–99)
Glucose-Capillary: 280 mg/dL — ABNORMAL HIGH (ref 70–99)

## 2019-05-01 LAB — BASIC METABOLIC PANEL
Anion gap: 11 (ref 5–15)
Anion gap: 12 (ref 5–15)
Anion gap: 11 (ref 5–15)
BUN: <5 mg/dL — ABNORMAL LOW (ref 6–20)
BUN: <5 mg/dL — ABNORMAL LOW (ref 6–20)
BUN: <5 mg/dL — ABNORMAL LOW (ref 6–20)
CO2: 16 mmol/L — ABNORMAL LOW (ref 22–32)
CO2: 16 mmol/L — ABNORMAL LOW (ref 22–32)
CO2: 17 mmol/L — ABNORMAL LOW (ref 22–32)
Calcium: 9 mg/dL (ref 8.9–10.3)
Calcium: 8.6 mg/dL — ABNORMAL LOW (ref 8.9–10.3)
Calcium: 8.9 mg/dL (ref 8.9–10.3)
Chloride: 107 mmol/L (ref 98–111)
Chloride: 105 mmol/L (ref 98–111)
Chloride: 106 mmol/L (ref 98–111)
Creatinine, Ser: 0.97 mg/dL (ref 0.61–1.24)
Creatinine, Ser: 0.91 mg/dL (ref 0.61–1.24)
Creatinine, Ser: 0.85 mg/dL (ref 0.61–1.24)
GFR calc Af Amer: >60 mL/min (ref >60)
GFR calc Af Amer: >60 mL/min (ref >60)
GFR calc Af Amer: >60 mL/min (ref >60)
GFR calc non Af Amer: >60 mL/min (ref >60)
GFR calc non Af Amer: >60 mL/min (ref >60)
GFR calc non Af Amer: >60 mL/min (ref >60)
Glucose, Bld: 190 mg/dL — ABNORMAL HIGH (ref 70–99)
Glucose, Bld: 270 mg/dL — ABNORMAL HIGH (ref 70–99)
Glucose, Bld: 201 mg/dL — ABNORMAL HIGH (ref 70–99)
Potassium: 3 mmol/L — ABNORMAL LOW (ref 3.5–5.1)
Potassium: 3.1 mmol/L — ABNORMAL LOW (ref 3.5–5.1)
Potassium: 3 mmol/L — ABNORMAL LOW (ref 3.5–5.1)
Sodium: 134 mmol/L — ABNORMAL LOW (ref 135–145)
Sodium: 133 mmol/L — ABNORMAL LOW (ref 135–145)
Sodium: 134 mmol/L — ABNORMAL LOW (ref 135–145)

## 2019-05-01 LAB — COMPREHENSIVE METABOLIC PANEL
ALT: 15 U/L (ref 0–44)
AST: 10 U/L — ABNORMAL LOW (ref 15–41)
Albumin: 3.6 g/dL (ref 3.5–5.0)
Alkaline Phosphatase: 112 U/L (ref 38–126)
Anion gap: 15 (ref 5–15)
BUN: <5 mg/dL — ABNORMAL LOW (ref 6–20)
CO2: 14 mmol/L — ABNORMAL LOW (ref 22–32)
Calcium: 9.6 mg/dL (ref 8.9–10.3)
Chloride: 106 mmol/L (ref 98–111)
Creatinine, Ser: 1.1 mg/dL (ref 0.61–1.24)
GFR calc Af Amer: >60 mL/min (ref >60)
GFR calc non Af Amer: >60 mL/min (ref >60)
Glucose, Bld: 167 mg/dL — ABNORMAL HIGH (ref 70–99)
Potassium: 3.4 mmol/L — ABNORMAL LOW (ref 3.5–5.1)
Sodium: 135 mmol/L (ref 135–145)
Total Bilirubin: 1.4 mg/dL — ABNORMAL HIGH (ref 0.3–1.2)
Total Protein: 8.7 g/dL — ABNORMAL HIGH (ref 6.5–8.1)

## 2019-05-01 LAB — CBC
HCT: 44.9 % (ref 39.0–52.0)
Hemoglobin: 15.1 g/dL (ref 13.0–17.0)
MCH: 29.4 pg (ref 26.0–34.0)
MCHC: 33.6 g/dL (ref 30.0–36.0)
MCV: 87.5 fL (ref 80.0–100.0)
Platelets: 182 10*3/uL (ref 150–400)
RBC: 5.13 MIL/uL (ref 4.22–5.81)
RDW: 13.1 % (ref 11.5–15.5)
WBC: 10.2 10*3/uL (ref 4.0–10.5)
nRBC: 0 % (ref 0.0–0.2)

## 2019-05-01 LAB — HEPATIC FUNCTION PANEL
ALT: 12 U/L (ref 0–44)
AST: 10 U/L — ABNORMAL LOW (ref 15–41)
Albumin: 3.3 g/dL — ABNORMAL LOW (ref 3.5–5.0)
Alkaline Phosphatase: 104 U/L (ref 38–126)
Bilirubin, Direct: 0.2 mg/dL (ref 0.0–0.2)
Indirect Bilirubin: 1.4 mg/dL — ABNORMAL HIGH (ref 0.3–0.9)
Total Bilirubin: 1.6 mg/dL — ABNORMAL HIGH (ref 0.3–1.2)
Total Protein: 8.7 g/dL — ABNORMAL HIGH (ref 6.5–8.1)

## 2019-05-01 LAB — HEMOGLOBIN A1C
Hgb A1c MFr Bld: 14.9 % — ABNORMAL HIGH (ref 4.8–5.6)
Mean Plasma Glucose: 380.93 mg/dL

## 2019-05-01 LAB — GLUTAMIC ACID DECARBOXYLASE AUTO ABS: Glutamic Acid Decarb Ab: <5 U/mL (ref 0.0–5.0)

## 2019-05-01 LAB — ANTI-ISLET CELL ANTIBODY: Pancreatic Islet Cell Antibody: NEGATIVE

## 2019-05-01 LAB — HIV ANTIBODY (ROUTINE TESTING W REFLEX): HIV Screen 4th Generation wRfx: NONREACTIVE

## 2019-05-01 LAB — MAGNESIUM: Magnesium: 1.8 mg/dL (ref 1.7–2.4)

## 2019-05-01 LAB — C-PEPTIDE: C-Peptide: 1 ng/mL — ABNORMAL LOW (ref 1.1–4.4)

## 2019-05-01 LAB — PHOSPHORUS: Phosphorus: 1.1 mg/dL — ABNORMAL LOW (ref 2.5–4.6)

## 2019-05-01 MED ORDER — INSULIN STARTER KIT- PEN NEEDLES (ENGLISH)
1.0000 | Freq: Once | Status: AC
  Administered 2019-05-01: 1
  Filled 2019-05-01: qty 1

## 2019-05-01 MED ORDER — LACTATED RINGERS IV BOLUS
2000.0000 mL | Freq: Once | INTRAVENOUS | Status: AC
  Administered 2019-05-01: 2000 mL via INTRAVENOUS

## 2019-05-01 MED ORDER — MAGNESIUM SULFATE IN D5W 1-5 GM/100ML-% IV SOLN
1.0000 g | Freq: Once | INTRAVENOUS | Status: AC
  Administered 2019-05-01: 1 g via INTRAVENOUS
  Filled 2019-05-01: qty 100

## 2019-05-01 MED ORDER — POTASSIUM CHLORIDE 10 MEQ/100ML IV SOLN
10.0000 meq | INTRAVENOUS | Status: AC
  Administered 2019-05-01 (×2): 10 meq via INTRAVENOUS
  Filled 2019-05-01 (×2): qty 100

## 2019-05-01 MED ORDER — POTASSIUM CHLORIDE 10 MEQ/100ML IV SOLN
10.0000 meq | INTRAVENOUS | Status: AC
  Administered 2019-05-01 (×4): 10 meq via INTRAVENOUS
  Filled 2019-05-01 (×4): qty 100

## 2019-05-01 MED ORDER — KCL IN DEXTROSE-NACL 40-5-0.9 MEQ/L-%-% IV SOLN
INTRAVENOUS | Status: AC
  Filled 2019-05-01 (×4): qty 1000

## 2019-05-01 MED ORDER — POTASSIUM PHOSPHATES 15 MMOLE/5ML IV SOLN
30.0000 mmol | Freq: Once | INTRAVENOUS | Status: AC
  Administered 2019-05-01: 30 mmol via INTRAVENOUS
  Filled 2019-05-01: qty 10

## 2019-05-01 NOTE — Progress Notes (Signed)
Inpatient Diabetes Program Recommendations  AACE/ADA: New Consensus Statement on Inpatient Glycemic Control (2015)  Target Ranges:  Prepandial:   less than 140 mg/dL      Peak postprandial:   less than 180 mg/dL (1-2 hours)      Critically ill patients:  140 - 180 mg/dL   Lab Results  Component Value Date   GLUCAP 181 (H) 05/01/2019   HGBA1C 14.9 (H) 05/01/2019    Review of Glycemic Control  Diabetes history: DM2 (C-peptide is 1.0)  Current orders for Inpatient glycemic control:  IV Insulin/DKA order set  Spoke with patient at bedside.  Explained importance of insulin administration due to very low production of insulin.  He verbalizes understanding.  He prefers vial and syringe.  Order # [id number].  We reviewed hypoglycemia and treatment.    Thank you, Dulce Sellar, RN, BSN Diabetes Coordinator Inpatient Diabetes Program [phone] (team pager from 8a-5p)

## 2019-05-01 NOTE — Progress Notes (Signed)
Inpatient Diabetes Program Recommendations  AACE/ADA: New Consensus Statement on Inpatient Glycemic Control (2015)  Target Ranges:  Prepandial:   less than 140 mg/dL      Peak postprandial:   less than 180 mg/dL (1-2 hours)      Critically ill patients:  140 - 180 mg/dL   Lab Results  Component Value Date   GLUCAP 177 (H) 05/01/2019   HGBA1C 14.9 (H) 05/01/2019    Review of Glycemic Control Results for Dylan Watts, Dylan Watts (MRN [id number]) as of 05/01/2019 11:32  Ref. Range 05/01/2019 09:03 05/01/2019 10:03 05/01/2019 11:04  Glucose-Capillary Latest Ref Range: 70 - 99 mg/dL 427 (H) 062 (H) 376 (H)  Results for Dylan Watts, Dylan Watts (MRN [id number]) as of 05/01/2019 11:32  Ref. Range 04/30/2019 13:54  C-Peptide Latest Ref Range: 1.1 - 4.4 ng/mL 1.0 (L)   Diabetes history: DM 2 (however note that C-peptide is 1.0) Outpatient Diabetes medications:  Glucotrol 5 mg bid, Metformin 1000 mg daily Current orders for Inpatient glycemic control:  IV insulin/DKA order set  Inpatient Diabetes Program Recommendations:    A1C indicates very uncontrolled DM and C-peptide was low. Patient still requiring insulin drip as CO2=14 and AG=15.   Based on presentation, A1C and low C-peptide, patient will need insulin prescribed at d/c.  Will reinforce with patient and discuss A1C today.   When patient is ready for transition off insulin drip, consider Lantus 35 units 1-2 hours prior to d/c of insulin drip.  Also consider Novolog moderate correction and Novolog 5 units tid with meals.   Will also place referral for [facility], as patient is established at [facility] and will need to f/u and use pharmacy there for prescriptions due to lack of insurance.    Thanks  Beryl Meager, RN, BC-ADM Inpatient Diabetes Coordinator Pager [phone] (8a-5p)

## 2019-05-01 NOTE — Progress Notes (Signed)
Pt called the nurses station to go to the bathroom. When I showed up he was sitting in the chair and saying he needed to use the bathroom but that he was light headed and felt like he was going to pass out. I said it might be best to stay in the chair for now while he was light headed. As I was speaking his eyes closed and he started drifting to the left in the chair. RN got a BP which was 137/105 and and CBG which was 205. Dr. Allena Katz made aware and added a 2000 cc LR bolus.

## 2019-05-01 NOTE — Progress Notes (Addendum)
PROGRESS NOTE    Dylan Watts  TSV:[id number] DOB: 03-31-1990 DOA: 04/30/2019 PCP: Cain Saupe, MD   Brief Narrative:  29 year old African-American male with PMH of diabetes mellitus (unclear if type I or type II; reported as type II per patient), morbid obesity, hypertension who presented with nausea/vomiting and found to be in profound DKA.  On insulin drip and IV fluids.  Presenting symptoms have resolved.   Assessment & Plan:   Principal Problem:   DKA (diabetic ketoacidoses) (HCC) Active Problems:   Hyperlipidemia   Nausea & vomiting   Hypertension  Diabetic ketoacidosis History of poorly controlled diabetes mellitus, unclear if type I or type II Hemoglobin A1c 14 pH 7, bicarbonate 8, anion gap 25 with the time of presentation which is gradually improving S/p 7 L of IV fluid boluses Continue maintenance fluid.  Increase rate of D5-1/2 NS to 125 cc/h Continue IV insulin until gap closes BMP every 4, mag/phos daily Replace electrolytes Was on oral hypoglycemics prior to presentation.  Will need insulin at the time of discharge. C-peptide low indicating decreased endogenous production of insulin.  Gad antibodies, islet cell antibodies pending We will start on a diabetic diet as he would like to eat and his symptoms of nausea/vomiting have resolved  Acute kidney injury Baseline creatinine normal Creatinine elevated to 1.46 on admission.  Likely prerenal in setting of dehydration Much improved with IV fluids  Hypomagnesemia Hypophosphatemia Hypokalemia We will add potassium to maintenance fluids if required Replace as needed  Hypertension On losartan at home which is on hold due to AKI Restart when appropriate  Morbid obesity Counseled on lifestyle modification  DVT prophylaxis: Lovenox SQ Code Status: Full code  Family Communication:  Disposition Plan: Pending medical stability Needs inpatient stay for IV insulin for DKA   Consultants:    None  Procedures:  None  Antimicrobials:   None   Subjective: Complained of lightheadedness while sitting on the chair which improved when laying down on the bed.  Otherwise doing fine.  Objective: Vitals:   04/30/19 1933 04/30/19 2009 04/30/19 2336 05/01/19 0259  BP:   (!) 132/91   Pulse: (!) 102 98    Resp: (!) 22 (!) 24 20   Temp:   99 F (37.2 C) 97.8 F (36.6 C)  TempSrc:   Oral Oral  SpO2: 99% 100%    Weight:      Height:        Intake/Output Summary (Last 24 hours) at 05/01/2019 0746 Last data filed at 05/01/2019 0548 Gross per 24 hour  Intake 1896.06 ml  Output 1900 ml  Net -3.94 ml   Filed Weights   04/30/19 0357 04/30/19 1102  Weight: (!) 150 kg (!) 151.8 kg    Examination:  General exam: Appears calm and comfortable  Respiratory system: Clear to auscultation. Respiratory effort normal. Cardiovascular system: S1 & S2 heard, RRR. No JVD. No pedal edema. Gastrointestinal system: Abdomen is nondistended, soft and nontender. Normal bowel sounds heard. Central nervous system: Alert and oriented. No focal neurological deficits. Extremities: Symmetric 5 x 5 power. Skin: No rashes, lesions or ulcers Psychiatry: Judgement and insight appear normal. Mood & affect appropriate.   Data Reviewed: I have personally reviewed following labs and imaging studies  CBC: Recent Labs  Lab 04/30/19 0439 04/30/19 0544 05/01/19 0149  WBC 16.3*  --  10.2  NEUTROABS 13.5*  --   --   HGB 16.1 17.3* 15.1  HCT 48.7 51.0 44.9  MCV 90.2  --  87.5  PLT 212  --  382   Basic Metabolic Panel: Recent Labs  Lab 04/30/19 0423 04/30/19 0439 04/30/19 1018 04/30/19 1354 04/30/19 1758 04/30/19 2154 05/01/19 0149  NA  --    < > 136 138 136 134* 135  K  --    < > 3.4* 3.6 3.8 3.3* 3.4*  CL  --    < > 110 108 110 113* 106  CO2  --    < > 9* 11* 10* 13* 14*  GLUCOSE  --    < > 203* 192* 189* 192* 167*  BUN  --    < > 6 5* 5* 5* <5*  CREATININE  --    < > 1.35* 1.26*  1.23 1.15 1.10  CALCIUM  --    < > 8.8* 9.3 9.0 8.9 9.6  MG 1.9  --   --   --   --   --  1.8  PHOS  --   --   --   --   --   --  1.1*   < > = values in this interval not displayed.   GFR: Estimated Creatinine Clearance: 150.4 mL/min (by C-G formula based on SCr of 1.1 mg/dL). Liver Function Tests: Recent Labs  Lab 04/30/19 0439 05/01/19 0149  AST 13* 10*  10*  ALT 17 15  12   ALKPHOS 125 112  104  BILITOT 1.6* 1.4*  1.6*  PROT 10.0* 8.7*  8.7*  ALBUMIN 3.9 3.6  3.3*   Recent Labs  Lab 04/30/19 0439  LIPASE 33   No results for input(s): AMMONIA in the last 168 hours. Coagulation Profile: No results for input(s): INR, PROTIME in the last 168 hours. Cardiac Enzymes: No results for input(s): CKTOTAL, CKMB, CKMBINDEX, TROPONINI in the last 168 hours. BNP (last 3 results) No results for input(s): PROBNP in the last 8760 hours. HbA1C: Recent Labs    05/01/19 0149  HGBA1C 14.9*   CBG: Recent Labs  Lab 04/30/19 2306 05/01/19 0012 05/01/19 0115 05/01/19 0320 05/01/19 0532  GLUCAP 172* 179* 177* 163* 171*   Lipid Profile: No results for input(s): CHOL, HDL, LDLCALC, TRIG, CHOLHDL, LDLDIRECT in the last 72 hours. Thyroid Function Tests: No results for input(s): TSH, T4TOTAL, FREET4, T3FREE, THYROIDAB in the last 72 hours. Anemia Panel: No results for input(s): VITAMINB12, FOLATE, FERRITIN, TIBC, IRON, RETICCTPCT in the last 72 hours. Sepsis Labs: No results for input(s): PROCALCITON, LATICACIDVEN in the last 168 hours.  Recent Results (from the past 240 hour(s))  Respiratory Panel by RT PCR (Flu A&B, Covid) - Nasopharyngeal Swab     Status: None   Collection Time: 04/30/19  7:02 AM   Specimen: Nasopharyngeal Swab  Result Value Ref Range Status   SARS Coronavirus 2 by RT PCR NEGATIVE NEGATIVE Final    Comment: (NOTE) SARS-CoV-2 target nucleic acids are NOT DETECTED. The SARS-CoV-2 RNA is generally detectable in upper respiratoy specimens during the acute  phase of infection. The lowest concentration of SARS-CoV-2 viral copies this assay can detect is 131 copies/mL. A negative result does not preclude SARS-Cov-2 infection and should not be used as the sole basis for treatment or other patient management decisions. A negative result may occur with  improper specimen collection/handling, submission of specimen other than nasopharyngeal swab, presence of viral mutation(s) within the areas targeted by this assay, and inadequate number of viral copies (<131 copies/mL). A negative result must be combined with clinical observations, patient history, and epidemiological information. The expected result is  Negative. Fact Sheet for Patients:  https://www.moore.com/ Fact Sheet for Healthcare Providers:  https://www.young.biz/ This test is not yet ap proved or cleared by the [place] FDA and  has been authorized for detection and/or diagnosis of SARS-CoV-2 by FDA under an Emergency Use Authorization (EUA). This EUA will remain  in effect (meaning this test can be used) for the duration of the COVID-19 declaration under Section 564(b)(1) of the Act, 21 U.S.C. section 360bbb-3(b)(1), unless the authorization is terminated or revoked sooner.    Influenza A by PCR NEGATIVE NEGATIVE Final   Influenza B by PCR NEGATIVE NEGATIVE Final    Comment: (NOTE) The Xpert Xpress SARS-CoV-2/FLU/RSV assay is intended as an aid in  the diagnosis of influenza from Nasopharyngeal swab specimens and  should not be used as a sole basis for treatment. Nasal washings and  aspirates are unacceptable for Xpert Xpress SARS-CoV-2/FLU/RSV  testing. Fact Sheet for Patients: https://www.moore.com/ Fact Sheet for Healthcare Providers: https://www.young.biz/ This test is not yet approved or cleared by the [place] FDA and  has been authorized for detection and/or diagnosis of SARS-CoV-2 by   FDA under an Emergency Use Authorization (EUA). This EUA will remain  in effect (meaning this test can be used) for the duration of the  Covid-19 declaration under Section 564(b)(1) of the Act, 21  U.S.C. section 360bbb-3(b)(1), unless the authorization is  terminated or revoked. Performed at [facility], [address] [place] [address], [place], [place] [address]   MRSA PCR Screening     Status: None   Collection Time: 04/30/19 11:14 AM   Specimen: Nasal Mucosa; Nasopharyngeal  Result Value Ref Range Status   MRSA by PCR NEGATIVE NEGATIVE Final    Comment:        The GeneXpert MRSA Assay (FDA approved for NASAL specimens only), is one component of a comprehensive MRSA colonization surveillance program. It is not intended to diagnose MRSA infection nor to guide or monitor treatment for MRSA infections. Performed at [facility], [address], [place], [place] [address]          Radiology Studies: No results found.      Scheduled Meds: . atorvastatin  20 mg Oral Daily  . insulin aspart  5 Units Subcutaneous TID WC  . venlafaxine XR  75 mg Oral Q breakfast   Continuous Infusions: . sodium chloride Stopped (04/30/19 0751)  . dextrose 5 % and 0.45% NaCl 75 mL/hr at 05/01/19 0548  . insulin 4.6 mL/hr at 05/01/19 0548  . lactated ringers    . magnesium sulfate bolus IVPB    . potassium chloride    . potassium PHOSPHATE IVPB (in mmol)       LOS: 1 day    Time spent: Spent more than 30 minutes in coordinating care for this patient including bedside patient care.    Liborio Nixon, MD [facility] If 7PM-7AM, please contact night-coverage 05/01/2019, 7:46 AM

## 2019-05-01 NOTE — Plan of Care (Signed)
  RD consulted for nutrition education regarding diabetes.   Lab Results  Component Value Date   HGBA1C 14.9 (H) 05/01/2019    RD provided "Carbohydrate Counting for People with Diabetes" handout from the Academy of Nutrition and Dietetics. Discussed different food groups and their effects on blood sugar, emphasizing carbohydrate-containing foods. Provided list of carbohydrates and recommended serving sizes of common foods.  Discussed importance of controlled and consistent carbohydrate intake throughout the day. Provided examples of ways to balance meals/snacks and encouraged intake of high-fiber, whole grain complex carbohydrates. Teach back method used.  Pt endorses eating three meals daily and drinks mostly water/juice. Stopped taking his medication two weeks ago due to starting a new job (lack of time). Discussed the importance of eat three balanced meals daily, which foods contain carbohydrates, how to read a food label, and how to make drink substitutions. Answered all questions.   Expect fair compliance.  Current diet order is carbohydrate modified, patient is consuming approximately 80% of meals at this time. Labs and medications reviewed. No further nutrition interventions warranted at this time. RD contact information provided. If additional nutrition issues arise, please re-consult RD.  Vanessa Kick RD, LDN Clinical Nutrition Pager listed in AMION

## 2019-05-01 NOTE — Plan of Care (Signed)
  Problem: Education: Goal: Knowledge of General Education information will improve Description: Including pain rating scale, medication(s)/side effects and non-pharmacologic comfort measures Outcome: Progressing   Problem: Health Behavior/Discharge Planning: Goal: Ability to manage health-related needs will improve Outcome: Progressing   Problem: Clinical Measurements: Goal: Will remain free from infection Outcome: Progressing Goal: Diagnostic test results will improve Outcome: Progressing Goal: Respiratory complications will improve Outcome: Progressing   Problem: Activity: Goal: Risk for activity intolerance will decrease Outcome: Progressing   Problem: Coping: Goal: Level of anxiety will decrease Outcome: Progressing   Problem: Pain Managment: Goal: General experience of comfort will improve Outcome: Progressing

## 2019-05-01 NOTE — Plan of Care (Signed)

## 2019-05-02 LAB — GLUCOSE, CAPILLARY
Glucose-Capillary: 112 mg/dL — ABNORMAL HIGH (ref 70–99)
Glucose-Capillary: 131 mg/dL — ABNORMAL HIGH (ref 70–99)
Glucose-Capillary: 134 mg/dL — ABNORMAL HIGH (ref 70–99)
Glucose-Capillary: 135 mg/dL — ABNORMAL HIGH (ref 70–99)
Glucose-Capillary: 136 mg/dL — ABNORMAL HIGH (ref 70–99)
Glucose-Capillary: 137 mg/dL — ABNORMAL HIGH (ref 70–99)
Glucose-Capillary: 142 mg/dL — ABNORMAL HIGH (ref 70–99)
Glucose-Capillary: 146 mg/dL — ABNORMAL HIGH (ref 70–99)
Glucose-Capillary: 150 mg/dL — ABNORMAL HIGH (ref 70–99)
Glucose-Capillary: 150 mg/dL — ABNORMAL HIGH (ref 70–99)
Glucose-Capillary: 151 mg/dL — ABNORMAL HIGH (ref 70–99)
Glucose-Capillary: 153 mg/dL — ABNORMAL HIGH (ref 70–99)
Glucose-Capillary: 159 mg/dL — ABNORMAL HIGH (ref 70–99)
Glucose-Capillary: 173 mg/dL — ABNORMAL HIGH (ref 70–99)
Glucose-Capillary: 183 mg/dL — ABNORMAL HIGH (ref 70–99)
Glucose-Capillary: 196 mg/dL — ABNORMAL HIGH (ref 70–99)
Glucose-Capillary: 212 mg/dL — ABNORMAL HIGH (ref 70–99)
Glucose-Capillary: 226 mg/dL — ABNORMAL HIGH (ref 70–99)
Glucose-Capillary: 227 mg/dL — ABNORMAL HIGH (ref 70–99)
Glucose-Capillary: 95 mg/dL (ref 70–99)

## 2019-05-02 LAB — BASIC METABOLIC PANEL
Anion gap: 10 (ref 5–15)
Anion gap: 10 (ref 5–15)
Anion gap: 10 (ref 5–15)
Anion gap: 11 (ref 5–15)
BUN: <5 mg/dL — ABNORMAL LOW (ref 6–20)
BUN: <5 mg/dL — ABNORMAL LOW (ref 6–20)
BUN: <5 mg/dL — ABNORMAL LOW (ref 6–20)
BUN: <5 mg/dL — ABNORMAL LOW (ref 6–20)
CO2: 18 mmol/L — ABNORMAL LOW (ref 22–32)
CO2: 18 mmol/L — ABNORMAL LOW (ref 22–32)
CO2: 19 mmol/L — ABNORMAL LOW (ref 22–32)
CO2: 19 mmol/L — ABNORMAL LOW (ref 22–32)
Calcium: 8.3 mg/dL — ABNORMAL LOW (ref 8.9–10.3)
Calcium: 8.6 mg/dL — ABNORMAL LOW (ref 8.9–10.3)
Calcium: 8.7 mg/dL — ABNORMAL LOW (ref 8.9–10.3)
Calcium: 8.7 mg/dL — ABNORMAL LOW (ref 8.9–10.3)
Chloride: 108 mmol/L (ref 98–111)
Chloride: 108 mmol/L (ref 98–111)
Chloride: 108 mmol/L (ref 98–111)
Chloride: 109 mmol/L (ref 98–111)
Creatinine, Ser: 0.69 mg/dL (ref 0.61–1.24)
Creatinine, Ser: 0.79 mg/dL (ref 0.61–1.24)
Creatinine, Ser: 0.82 mg/dL (ref 0.61–1.24)
Creatinine, Ser: 0.83 mg/dL (ref 0.61–1.24)
GFR calc Af Amer: >60 mL/min (ref >60)
GFR calc Af Amer: >60 mL/min (ref >60)
GFR calc Af Amer: >60 mL/min (ref >60)
GFR calc Af Amer: >60 mL/min (ref >60)
GFR calc non Af Amer: >60 mL/min (ref >60)
GFR calc non Af Amer: >60 mL/min (ref >60)
GFR calc non Af Amer: >60 mL/min (ref >60)
GFR calc non Af Amer: >60 mL/min (ref >60)
Glucose, Bld: 149 mg/dL — ABNORMAL HIGH (ref 70–99)
Glucose, Bld: 153 mg/dL — ABNORMAL HIGH (ref 70–99)
Glucose, Bld: 184 mg/dL — ABNORMAL HIGH (ref 70–99)
Glucose, Bld: 221 mg/dL — ABNORMAL HIGH (ref 70–99)
Potassium: 2.6 mmol/L — CL (ref 3.5–5.1)
Potassium: 2.7 mmol/L — CL (ref 3.5–5.1)
Potassium: 2.8 mmol/L — ABNORMAL LOW (ref 3.5–5.1)
Potassium: 3.4 mmol/L — ABNORMAL LOW (ref 3.5–5.1)
Sodium: 136 mmol/L (ref 135–145)
Sodium: 136 mmol/L (ref 135–145)
Sodium: 138 mmol/L (ref 135–145)
Sodium: 138 mmol/L (ref 135–145)

## 2019-05-02 LAB — MAGNESIUM
Magnesium: 1.6 mg/dL — ABNORMAL LOW (ref 1.7–2.4)
Magnesium: 2.1 mg/dL (ref 1.7–2.4)

## 2019-05-02 LAB — PHOSPHORUS: Phosphorus: 1.8 mg/dL — ABNORMAL LOW (ref 2.5–4.6)

## 2019-05-02 MED ORDER — POTASSIUM CHLORIDE 10 MEQ/100ML IV SOLN
10.0000 meq | INTRAVENOUS | Status: AC
  Administered 2019-05-02 (×2): 10 meq via INTRAVENOUS
  Filled 2019-05-02 (×2): qty 100

## 2019-05-02 MED ORDER — POTASSIUM CHLORIDE 10 MEQ/100ML IV SOLN: 10.0000 meq | INTRAVENOUS | Status: DC

## 2019-05-02 MED ORDER — INSULIN ASPART 100 UNIT/ML ~~LOC~~ SOLN: 0.0000 [IU] | Freq: Every day | SUBCUTANEOUS | Status: DC

## 2019-05-02 MED ORDER — INSULIN ASPART 100 UNIT/ML ~~LOC~~ SOLN
6.0000 [IU] | Freq: Three times a day (TID) | SUBCUTANEOUS | Status: DC
  Administered 2019-05-03 (×2): 6 [IU] via SUBCUTANEOUS

## 2019-05-02 MED ORDER — LOSARTAN POTASSIUM 25 MG PO TABS
25.0000 mg | ORAL_TABLET | Freq: Every day | ORAL | Status: DC
  Administered 2019-05-02: 25 mg via ORAL
  Filled 2019-05-02: qty 1

## 2019-05-02 MED ORDER — POTASSIUM PHOSPHATES 15 MMOLE/5ML IV SOLN
30.0000 mmol | Freq: Once | INTRAVENOUS | Status: AC
  Administered 2019-05-02: 30 mmol via INTRAVENOUS
  Filled 2019-05-02: qty 10

## 2019-05-02 MED ORDER — LOSARTAN POTASSIUM 50 MG PO TABS
50.0000 mg | ORAL_TABLET | Freq: Every day | ORAL | Status: DC
  Administered 2019-05-03 – 2019-05-04 (×2): 50 mg via ORAL
  Filled 2019-05-02 (×2): qty 1

## 2019-05-02 MED ORDER — POTASSIUM CHLORIDE 10 MEQ/100ML IV SOLN
10.0000 meq | INTRAVENOUS | Status: DC
  Administered 2019-05-02 (×2): 10 meq via INTRAVENOUS
  Filled 2019-05-02 (×2): qty 100

## 2019-05-02 MED ORDER — MAGNESIUM SULFATE 4 GM/100ML IV SOLN
4.0000 g | Freq: Once | INTRAVENOUS | Status: AC
  Administered 2019-05-02: 4 g via INTRAVENOUS
  Filled 2019-05-02: qty 100

## 2019-05-02 MED ORDER — INSULIN GLARGINE 100 UNIT/ML ~~LOC~~ SOLN
45.0000 [IU] | Freq: Every day | SUBCUTANEOUS | Status: DC
  Administered 2019-05-02: 45 [IU] via SUBCUTANEOUS
  Filled 2019-05-02 (×3): qty 0.45

## 2019-05-02 MED ORDER — POTASSIUM CHLORIDE 20 MEQ PO PACK
40.0000 meq | PACK | Freq: Once | ORAL | Status: AC
  Administered 2019-05-02: 40 meq via ORAL
  Filled 2019-05-02: qty 2

## 2019-05-02 MED ORDER — INSULIN ASPART 100 UNIT/ML ~~LOC~~ SOLN
0.0000 [IU] | Freq: Three times a day (TID) | SUBCUTANEOUS | Status: DC
  Administered 2019-05-03 (×2): 4 [IU] via SUBCUTANEOUS
  Administered 2019-05-03: 3 [IU] via SUBCUTANEOUS
  Administered 2019-05-04: 4 [IU] via SUBCUTANEOUS
  Administered 2019-05-04: 3 [IU] via SUBCUTANEOUS

## 2019-05-02 MED ORDER — POTASSIUM CHLORIDE 20 MEQ PO PACK
40.0000 meq | PACK | ORAL | Status: AC
  Administered 2019-05-02 (×2): 40 meq via ORAL
  Filled 2019-05-02 (×2): qty 2

## 2019-05-02 NOTE — Progress Notes (Addendum)
PROGRESS NOTE    Dylan Watts  SWH:[id number] DOB: 12-Oct-1990 DOA: 04/30/2019 PCP: Cain Saupe, MD   Brief Narrative:  29 year old African-American male with PMH of diabetes mellitus (unclear if type I or type II; reported as type II per patient), morbid obesity, hypertension who presented with nausea/vomiting and found to be in profound DKA.  On insulin drip and IV fluids.  Presenting symptoms have resolved.   Assessment & Plan:   Principal Problem:   DKA (diabetic ketoacidoses) (HCC) Active Problems:   Hyperlipidemia   Nausea & vomiting   Hypertension  Diabetic ketoacidosis History of poorly controlled diabetes mellitus, unclear if type I or type II Hemoglobin A1c 14 pH 7, bicarbonate 8, anion gap 25 with the time of presentation which is gradually improving S/p 7 L of IV fluid boluses Continue maintenance fluid- D5-NS 100 cc/h Plan to transition from IV insulin to basal/bolus tonight- lantus 45 units at bedtime, mealtime insulin aspart 6 units TID, correctional insulin. Adjust insulin based on his BG.  BMP every 4, mag/phos daily Replace electrolytes C-peptide low indicating decreased endogenous production of insulin.  Gad antibodies, islet cell antibodies negative Was on oral hypoglycemics prior to presentation.  Will need insulin at the time of discharge. We will start on a diabetic diet as he would like to eat and his symptoms of nausea/vomiting have resolved  Acute kidney injury Baseline creatinine normal Creatinine elevated to 1.46 on admission.  Likely prerenal in setting of dehydration Resolved with IV fluids  Hypomagnesemia Hypophosphatemia Hypokalemia We will add potassium to maintenance fluids if required Replace as needed  Hypertension Restart home losartan at lower dose of 25 mg daily.  Uptitrate as needed  Morbid obesity Counseled on lifestyle modification  DVT prophylaxis: Lovenox SQ Code Status: Full code  Family Communication:  Disposition Plan:  Pending medical stability Needs inpatient stay for IV insulin for DKA   Consultants:   None  Procedures:  None  Antimicrobials:   None   Subjective: Sitting on a chair.  Feeling much better.  No complaints.  Objective: Vitals:   05/01/19 1944 05/01/19 2308 05/02/19 0311 05/02/19 0723  BP: 136/90 (!) 146/99 135/87 (!) 139/101  Pulse: 98 100 96 (!) 110  Resp: 20 20 (!) 21 16  Temp: 98.3 F (36.8 C) 97.8 F (36.6 C) 97.7 F (36.5 C) 98 F (36.7 C)  TempSrc: Oral Oral Oral Axillary  SpO2:  100% 100% 99%  Weight:      Height:        Intake/Output Summary (Last 24 hours) at 05/02/2019 0752 Last data filed at 05/02/2019 6659 Gross per 24 hour  Intake 1670.97 ml  Output 3350 ml  Net -1679.03 ml   Filed Weights   04/30/19 0357 04/30/19 1102  Weight: (!) 150 kg (!) 151.8 kg    Examination:  General exam: Appears calm and comfortable  Respiratory system: Clear to auscultation. Respiratory effort normal. Cardiovascular system: S1 & S2 heard, RRR. No JVD. No pedal edema. Gastrointestinal system: Abdomen is nondistended, soft and nontender. Normal bowel sounds heard. Central nervous system: Alert and oriented. No focal neurological deficits. Extremities: Symmetric 5 x 5 power. Skin: No rashes, lesions or ulcers Psychiatry: Judgement and insight appear normal. Mood & affect appropriate.   Data Reviewed: I have personally reviewed following labs and imaging studies  CBC: Recent Labs  Lab 04/30/19 0439 04/30/19 0544 05/01/19 0149  WBC 16.3*  --  10.2  NEUTROABS 13.5*  --   --   HGB 16.1 17.3*  15.1  HCT 48.7 51.0 44.9  MCV 90.2  --  87.5  PLT 212  --  182   Basic Metabolic Panel: Recent Labs  Lab 04/30/19 0423 04/30/19 0439 05/01/19 0149 05/01/19 0149 05/01/19 1219 05/01/19 1748 05/01/19 1955 05/01/19 2359 05/02/19 0448  NA  --    < > 135   < > 134* 133* 134* 136 138  K  --    < > 3.4*   < > 3.0* 3.1* 3.0* 2.8* 2.7*  CL  --    < > 106   < > 107 105  106 108 108  CO2  --    < > 14*   < > 16* 16* 17* 18* 19*  GLUCOSE  --    < > 167*   < > 190* 270* 201* 149* 153*  BUN  --    < > <5*   < > <5* <5* <5* <5* <5*  CREATININE  --    < > 1.10   < > 0.97 0.91 0.85 0.79 0.83  CALCIUM  --    < > 9.6   < > 9.0 8.6* 8.9 8.7* 8.7*  MG 1.9  --  1.8  --   --   --   --   --  1.6*  PHOS  --   --  1.1*  --   --   --   --   --  1.8*   < > = values in this interval not displayed.   GFR: Estimated Creatinine Clearance: 199.3 mL/min (by C-G formula based on SCr of 0.83 mg/dL). Liver Function Tests: Recent Labs  Lab 04/30/19 0439 05/01/19 0149  AST 13* 10*  10*  ALT 17 15  12   ALKPHOS 125 112  104  BILITOT 1.6* 1.4*  1.6*  PROT 10.0* 8.7*  8.7*  ALBUMIN 3.9 3.6  3.3*   Recent Labs  Lab 04/30/19 0439  LIPASE 33   No results for input(s): AMMONIA in the last 168 hours. Coagulation Profile: No results for input(s): INR, PROTIME in the last 168 hours. Cardiac Enzymes: No results for input(s): CKTOTAL, CKMB, CKMBINDEX, TROPONINI in the last 168 hours. BNP (last 3 results) No results for input(s): PROBNP in the last 8760 hours. HbA1C: Recent Labs    05/01/19 0149  HGBA1C 14.9*   CBG: Recent Labs  Lab 05/02/19 0211 05/02/19 0311 05/02/19 0417 05/02/19 0512 05/02/19 0612  GLUCAP 153* 136* 150* 151* 146*   Lipid Profile: No results for input(s): CHOL, HDL, LDLCALC, TRIG, CHOLHDL, LDLDIRECT in the last 72 hours. Thyroid Function Tests: No results for input(s): TSH, T4TOTAL, FREET4, T3FREE, THYROIDAB in the last 72 hours. Anemia Panel: No results for input(s): VITAMINB12, FOLATE, FERRITIN, TIBC, IRON, RETICCTPCT in the last 72 hours. Sepsis Labs: No results for input(s): PROCALCITON, LATICACIDVEN in the last 168 hours.  Recent Results (from the past 240 hour(s))  Respiratory Panel by RT PCR (Flu A&B, Covid) - Nasopharyngeal Swab     Status: None   Collection Time: 04/30/19  7:02 AM   Specimen: Nasopharyngeal Swab  Result Value  Ref Range Status   SARS Coronavirus 2 by RT PCR NEGATIVE NEGATIVE Final    Comment: (NOTE) SARS-CoV-2 target nucleic acids are NOT DETECTED. The SARS-CoV-2 RNA is generally detectable in upper respiratoy specimens during the acute phase of infection. The lowest concentration of SARS-CoV-2 viral copies this assay can detect is 131 copies/mL. A negative result does not preclude SARS-Cov-2 infection and should not be  used as the sole basis for treatment or other patient management decisions. A negative result may occur with  improper specimen collection/handling, submission of specimen other than nasopharyngeal swab, presence of viral mutation(s) within the areas targeted by this assay, and inadequate number of viral copies (<131 copies/mL). A negative result must be combined with clinical observations, patient history, and epidemiological information. The expected result is Negative. Fact Sheet for Patients:  PinkCheek.be Fact Sheet for Healthcare Providers:  GravelBags.it This test is not yet ap proved or cleared by the [place] FDA and  has been authorized for detection and/or diagnosis of SARS-CoV-2 by FDA under an Emergency Use Authorization (EUA). This EUA will remain  in effect (meaning this test can be used) for the duration of the COVID-19 declaration under Section 564(b)(1) of the Act, 21 U.S.C. section 360bbb-3(b)(1), unless the authorization is terminated or revoked sooner.    Influenza A by PCR NEGATIVE NEGATIVE Final   Influenza B by PCR NEGATIVE NEGATIVE Final    Comment: (NOTE) The Xpert Xpress SARS-CoV-2/FLU/RSV assay is intended as an aid in  the diagnosis of influenza from Nasopharyngeal swab specimens and  should not be used as a sole basis for treatment. Nasal washings and  aspirates are unacceptable for Xpert Xpress SARS-CoV-2/FLU/RSV  testing. Fact Sheet for  Patients: PinkCheek.be Fact Sheet for Healthcare Providers: GravelBags.it This test is not yet approved or cleared by the [place] FDA and  has been authorized for detection and/or diagnosis of SARS-CoV-2 by  FDA under an Emergency Use Authorization (EUA). This EUA will remain  in effect (meaning this test can be used) for the duration of the  Covid-19 declaration under Section 564(b)(1) of the Act, 21  U.S.C. section 360bbb-3(b)(1), unless the authorization is  terminated or revoked. Performed at [facility], [place] [address], [place], [place] [address]   MRSA PCR Screening     Status: None   Collection Time: 04/30/19 11:14 AM   Specimen: Nasal Mucosa; Nasopharyngeal  Result Value Ref Range Status   MRSA by PCR NEGATIVE NEGATIVE Final    Comment:        The GeneXpert MRSA Assay (FDA approved for NASAL specimens only), is one component of a comprehensive MRSA colonization surveillance program. It is not intended to diagnose MRSA infection nor to guide or monitor treatment for MRSA infections. Performed at [facility], [place] [address], [place], [place] [address]          Radiology Studies: No results found.      Scheduled Meds: . atorvastatin  20 mg Oral Daily  . venlafaxine XR  75 mg Oral Q breakfast   Continuous Infusions: . sodium chloride Stopped (04/30/19 0751)  . dextrose 5 % and 0.9 % NaCl with KCl 40 mEq/L 100 mL/hr at 05/02/19 0631  . insulin 4.6 Units/hr (05/02/19 0519)  . lactated ringers    . magnesium sulfate bolus IVPB    . potassium chloride    . potassium PHOSPHATE IVPB (in mmol)       LOS: 2 days    Time spent: Spent more than 30 minutes in coordinating care for this patient including bedside patient care.    Lucky Cowboy, MD [facility] If 7PM-7AM, please contact night-coverage 05/02/2019, 7:52 AM

## 2019-05-02 NOTE — Progress Notes (Addendum)
Critical lab value K+ = 2.6.  MD notified.  Has not yet received all potassium supplements as previously ordered.  See NO as per MD

## 2019-05-02 NOTE — Progress Notes (Signed)
Critical lab value K+ 2.7, Provider on call notified.

## 2019-05-03 LAB — BASIC METABOLIC PANEL
Anion gap: 12 (ref 5–15)
BUN: <5 mg/dL — ABNORMAL LOW (ref 6–20)
CO2: 19 mmol/L — ABNORMAL LOW (ref 22–32)
Calcium: 8.4 mg/dL — ABNORMAL LOW (ref 8.9–10.3)
Chloride: 107 mmol/L (ref 98–111)
Creatinine, Ser: 0.63 mg/dL (ref 0.61–1.24)
GFR calc Af Amer: >60 mL/min (ref >60)
GFR calc non Af Amer: >60 mL/min (ref >60)
Glucose, Bld: 168 mg/dL — ABNORMAL HIGH (ref 70–99)
Potassium: 3.6 mmol/L (ref 3.5–5.1)
Sodium: 138 mmol/L (ref 135–145)

## 2019-05-03 LAB — GLUCOSE, CAPILLARY
Glucose-Capillary: 156 mg/dL — ABNORMAL HIGH (ref 70–99)
Glucose-Capillary: 133 mg/dL — ABNORMAL HIGH (ref 70–99)
Glucose-Capillary: 157 mg/dL — ABNORMAL HIGH (ref 70–99)
Glucose-Capillary: 182 mg/dL — ABNORMAL HIGH (ref 70–99)
Glucose-Capillary: 151 mg/dL — ABNORMAL HIGH (ref 70–99)

## 2019-05-03 LAB — MAGNESIUM: Magnesium: 1.9 mg/dL (ref 1.7–2.4)

## 2019-05-03 LAB — PHOSPHORUS: Phosphorus: 2.8 mg/dL (ref 2.5–4.6)

## 2019-05-03 MED ORDER — INSULIN ASPART 100 UNIT/ML ~~LOC~~ SOLN
10.0000 [IU] | Freq: Three times a day (TID) | SUBCUTANEOUS | Status: DC
  Administered 2019-05-03 – 2019-05-04 (×3): 10 [IU] via SUBCUTANEOUS

## 2019-05-03 MED ORDER — INSULIN GLARGINE 100 UNIT/ML ~~LOC~~ SOLN
48.0000 [IU] | Freq: Every day | SUBCUTANEOUS | Status: DC
  Administered 2019-05-03: 48 [IU] via SUBCUTANEOUS
  Filled 2019-05-03 (×2): qty 0.48

## 2019-05-03 MED ORDER — POTASSIUM CHLORIDE CRYS ER 20 MEQ PO TBCR
40.0000 meq | EXTENDED_RELEASE_TABLET | Freq: Once | ORAL | Status: AC
  Administered 2019-05-03: 40 meq via ORAL
  Filled 2019-05-03: qty 2

## 2019-05-03 NOTE — TOC Initial Note (Signed)
Transition of Care [facility]) - Initial/Assessment Note    Patient Details  Name: Dylan Watts MRN: [id number] Date of Birth: 1991-03-04  Transition of [facility]) CM/SW Contact:    Lawerance Sabal, RN Phone Number: 05/03/2019, 7:44 AM  Clinical Narrative:          Patient admitted from home w mother, in DKA.  PCP Fulp at [facility], Insurance- none, will continue to follow and evaluate DC medications and offer assistance where possible. Consider using TOC pharmacy at DC (M-F).            Expected Discharge Plan: Home/Self Care     Patient Goals and CMS Choice        Expected Discharge Plan and Services Expected Discharge Plan: Home/Self Care                                              Prior Living Arrangements/Services                       Activities of Daily Living Home Assistive Devices/Equipment: CBG Meter ADL Screening (condition at time of admission) Patient's cognitive ability adequate to safely complete daily activities?: Yes Is the patient deaf or have difficulty hearing?: No Does the patient have difficulty seeing, even when wearing glasses/contacts?: No Does the patient have difficulty concentrating, remembering, or making decisions?: No Patient able to express need for assistance with ADLs?: Yes Does the patient have difficulty dressing or bathing?: No Independently performs ADLs?: Yes (appropriate for developmental age) Does the patient have difficulty walking or climbing stairs?: Yes Weakness of Legs: Both Weakness of Arms/Hands: None  Permission Sought/Granted                  Emotional Assessment              Admission diagnosis:  DKA (diabetic ketoacidoses) (HCC) [E11.10] Long QT interval [R94.31] Diabetic ketoacidosis without coma associated with type 2 diabetes mellitus (HCC) [E11.10] Patient Active Problem List   Diagnosis Date Noted  . DKA (diabetic ketoacidoses) (HCC) 04/30/2019  . Hyperlipidemia 04/30/2019  . Nausea  & vomiting 04/30/2019  . Hypertension 04/30/2019  . Obesity, Class III, BMI 40-49.9 (morbid obesity) (HCC) 09/08/2017  . Leukocytosis 09/08/2017  . AKI (acute kidney injury) (HCC) 09/08/2017  . Hypokalemia 09/08/2017  . Hypophosphatemia 09/08/2017  . Diabetic ketoacidosis (HCC) 09/07/2017   PCP:  Cain Saupe, MD Pharmacy:   [phone], [place] - [address] [place] [address] Phone: [phone] Fax: [phone]  [facility] [address] ([address] [place] - [address] [place] ([place]) [place] [address] Phone: [phone] Fax: [phone]  [facility] [place], [place] - [address] [place] [address] Phone: [phone] Fax: [phone]     Social Determinants of Health (SDOH) Interventions    Readmission Risk Interventions No flowsheet data found.

## 2019-05-03 NOTE — Plan of Care (Signed)

## 2019-05-03 NOTE — Progress Notes (Signed)
PROGRESS NOTE    Dylan Watts  MSX:[id number] DOB: 1990/10/30 DOA: 04/30/2019 PCP: Cain Saupe, MD   Brief Narrative:  29 year old African-American male with PMH of diabetes mellitus (unclear if type I or type II; reported as type II per patient), morbid obesity, hypertension who presented with nausea/vomiting and found to be in profound DKA.  On insulin drip and IV fluids.  Presenting symptoms have resolved.   Assessment & Plan:   Principal Problem:   DKA (diabetic ketoacidoses) (HCC) Active Problems:   Hyperlipidemia   Nausea & vomiting   Hypertension  Diabetic ketoacidosis History of poorly controlled diabetes mellitus, unclear if type I or type II Hemoglobin A1c 14 pH 7, bicarbonate 8, anion gap 25 with the time of presentation which is gradually improving S/p 7 L of IV fluid boluses.  Discontinue maintenance IV fluids Transition from IV insulin to basal/bolus on night of 05/02/2019 Continue Lantus 45 units at bedtime, increase mealtime insulin to 10 units 3 times daily, correctional insulin Change labs to daily BMP, mag, Phos C-peptide low indicating decreased endogenous production of insulin.  Gad antibodies, islet cell antibodies negative Was on oral hypoglycemics prior to presentation.  Will need insulin at the time of discharge.  Acute kidney injury Baseline creatinine normal Creatinine elevated to 1.46 on admission.  Likely prerenal in setting of dehydration Resolved with IV fluids  Hypomagnesemia Hypophosphatemia Hypokalemia Replace as needed  Hypertension Continue losartan at half home dose of 50 mg daily  Morbid obesity Counseled on lifestyle modification  DVT prophylaxis: Lovenox SQ Code Status: Full code  Family Communication:  Disposition Plan: Pending medical stability.  Tentative plan for discharge home tomorrow.  Will need education on insulin administration prior to discharge.  Has no insurance.  Diabetes coordinator on board.  Consultants:    None  Procedures:  None  Antimicrobials:   None   Subjective: Sitting on a chair.  Feeling much better.  No complaints.  Objective: Vitals:   05/02/19 1057 05/02/19 1631 05/02/19 1953 05/03/19 0411  BP: (!) 133/91 (!) 148/100 136/85   Pulse: 96 (!) 120 (!) 102 95  Resp: (!) 23 13    Temp: 98.2 F (36.8 C) (!) 97.3 F (36.3 C) 98.5 F (36.9 C) 98.4 F (36.9 C)  TempSrc: Oral Oral Oral Oral  SpO2: 100% 98%    Weight:      Height:        Intake/Output Summary (Last 24 hours) at 05/03/2019 0739 Last data filed at 05/02/2019 1837 Gross per 24 hour  Intake 1520.66 ml  Output 925 ml  Net 595.66 ml   Filed Weights   04/30/19 0357 04/30/19 1102  Weight: (!) 150 kg (!) 151.8 kg    Examination:  General exam: Appears calm and comfortable  Respiratory system: Clear to auscultation. Respiratory effort normal. Cardiovascular system: S1 & S2 heard, RRR. No JVD. No pedal edema. Gastrointestinal system: Abdomen is nondistended, soft and nontender. Normal bowel sounds heard. Central nervous system: Alert and oriented. No focal neurological deficits. Extremities: Symmetric 5 x 5 power. Skin: No rashes, lesions or ulcers Psychiatry: Judgement and insight appear normal. Mood & affect appropriate.   Data Reviewed: I have personally reviewed following labs and imaging studies  CBC: Recent Labs  Lab 04/30/19 0439 04/30/19 0544 05/01/19 0149  WBC 16.3*  --  10.2  NEUTROABS 13.5*  --   --   HGB 16.1 17.3* 15.1  HCT 48.7 51.0 44.9  MCV 90.2  --  87.5  PLT 212  --  956   Basic Metabolic Panel: Recent Labs  Lab 04/30/19 0423 04/30/19 0439 05/01/19 0149 05/01/19 1219 05/01/19 2359 05/02/19 0448 05/02/19 0805 05/02/19 1735 05/03/19 0054  NA  --    < > 135   < > 136 138 138 136 138  K  --    < > 3.4*   < > 2.8* 2.7* 2.6* 3.4* 3.6  CL  --    < > 106   < > 108 108 109 108 107  CO2  --    < > 14*   < > 18* 19* 19* 18* 19*  GLUCOSE  --    < > 167*   < > 149* 153*  184* 221* 168*  BUN  --    < > <5*   < > <5* <5* <5* <5* <5*  CREATININE  --    < > 1.10   < > 0.79 0.83 0.82 0.69 0.63  CALCIUM  --    < > 9.6   < > 8.7* 8.7* 8.6* 8.3* 8.4*  MG 1.9  --  1.8  --   --  1.6*  --  2.1 1.9  PHOS  --   --  1.1*  --   --  1.8*  --   --  2.8   < > = values in this interval not displayed.   GFR: Estimated Creatinine Clearance: 206.8 mL/min (by C-G formula based on SCr of 0.63 mg/dL). Liver Function Tests: Recent Labs  Lab 04/30/19 0439 05/01/19 0149  AST 13* 10*  10*  ALT 17 15  12   ALKPHOS 125 112  104  BILITOT 1.6* 1.4*  1.6*  PROT 10.0* 8.7*  8.7*  ALBUMIN 3.9 3.6  3.3*   Recent Labs  Lab 04/30/19 0439  LIPASE 33   No results for input(s): AMMONIA in the last 168 hours. Coagulation Profile: No results for input(s): INR, PROTIME in the last 168 hours. Cardiac Enzymes: No results for input(s): CKTOTAL, CKMB, CKMBINDEX, TROPONINI in the last 168 hours. BNP (last 3 results) No results for input(s): PROBNP in the last 8760 hours. HbA1C: Recent Labs    05/01/19 0149  HGBA1C 14.9*   CBG: Recent Labs  Lab 05/02/19 2103 05/02/19 2156 05/02/19 2356 05/03/19 0212 05/03/19 0634  GLUCAP 95 112* 150* 156* 133*   Lipid Profile: No results for input(s): CHOL, HDL, LDLCALC, TRIG, CHOLHDL, LDLDIRECT in the last 72 hours. Thyroid Function Tests: No results for input(s): TSH, T4TOTAL, FREET4, T3FREE, THYROIDAB in the last 72 hours. Anemia Panel: No results for input(s): VITAMINB12, FOLATE, FERRITIN, TIBC, IRON, RETICCTPCT in the last 72 hours. Sepsis Labs: No results for input(s): PROCALCITON, LATICACIDVEN in the last 168 hours.  Recent Results (from the past 240 hour(s))  Respiratory Panel by RT PCR (Flu A&B, Covid) - Nasopharyngeal Swab     Status: None   Collection Time: 04/30/19  7:02 AM   Specimen: Nasopharyngeal Swab  Result Value Ref Range Status   SARS Coronavirus 2 by RT PCR NEGATIVE NEGATIVE Final    Comment:  (NOTE) SARS-CoV-2 target nucleic acids are NOT DETECTED. The SARS-CoV-2 RNA is generally detectable in upper respiratoy specimens during the acute phase of infection. The lowest concentration of SARS-CoV-2 viral copies this assay can detect is 131 copies/mL. A negative result does not preclude SARS-Cov-2 infection and should not be used as the sole basis for treatment or other patient management decisions. A negative result may occur with  improper specimen collection/handling, submission of specimen  other than nasopharyngeal swab, presence of viral mutation(s) within the areas targeted by this assay, and inadequate number of viral copies (<131 copies/mL). A negative result must be combined with clinical observations, patient history, and epidemiological information. The expected result is Negative. Fact Sheet for Patients:  https://www.moore.com/ Fact Sheet for Healthcare Providers:  https://www.young.biz/ This test is not yet ap proved or cleared by the [place] FDA and  has been authorized for detection and/or diagnosis of SARS-CoV-2 by FDA under an Emergency Use Authorization (EUA). This EUA will remain  in effect (meaning this test can be used) for the duration of the COVID-19 declaration under Section 564(b)(1) of the Act, 21 U.S.C. section 360bbb-3(b)(1), unless the authorization is terminated or revoked sooner.    Influenza A by PCR NEGATIVE NEGATIVE Final   Influenza B by PCR NEGATIVE NEGATIVE Final    Comment: (NOTE) The Xpert Xpress SARS-CoV-2/FLU/RSV assay is intended as an aid in  the diagnosis of influenza from Nasopharyngeal swab specimens and  should not be used as a sole basis for treatment. Nasal washings and  aspirates are unacceptable for Xpert Xpress SARS-CoV-2/FLU/RSV  testing. Fact Sheet for Patients: https://www.moore.com/ Fact Sheet for Healthcare  Providers: https://www.young.biz/ This test is not yet approved or cleared by the [place] FDA and  has been authorized for detection and/or diagnosis of SARS-CoV-2 by  FDA under an Emergency Use Authorization (EUA). This EUA will remain  in effect (meaning this test can be used) for the duration of the  Covid-19 declaration under Section 564(b)(1) of the Act, 21  U.S.C. section 360bbb-3(b)(1), unless the authorization is  terminated or revoked. Performed at [facility], [address] [place], [place] [address]   MRSA PCR Screening     Status: None   Collection Time: 04/30/19 11:14 AM   Specimen: Nasal Mucosa; Nasopharyngeal  Result Value Ref Range Status   MRSA by PCR NEGATIVE NEGATIVE Final    Comment:        The GeneXpert MRSA Assay (FDA approved for NASAL specimens only), is one component of a comprehensive MRSA colonization surveillance program. It is not intended to diagnose MRSA infection nor to guide or monitor treatment for MRSA infections. Performed at [facility], [address] [place], [place] [address]          Radiology Studies: No results found.      Scheduled Meds: . atorvastatin  20 mg Oral Daily  . insulin aspart  0-20 Units Subcutaneous TID WC  . insulin aspart  0-5 Units Subcutaneous QHS  . insulin aspart  6 Units Subcutaneous TID WC  . insulin glargine  45 Units Subcutaneous QHS  . losartan  50 mg Oral Daily  . potassium chloride  40 mEq Oral Once  . venlafaxine XR  75 mg Oral Q breakfast   Continuous Infusions: . lactated ringers       LOS: 3 days    Time spent: Spent more than 30 minutes in coordinating care for this patient including bedside patient care.    Liborio Nixon, MD [facility] If 7PM-7AM, please contact night-coverage 05/03/2019, 7:39 AM

## 2019-05-04 ENCOUNTER — Other Ambulatory Visit (HOSPITAL_COMMUNITY): Payer: Self-pay | Admitting: Family Medicine

## 2019-05-04 DIAGNOSIS — E1165 Type 2 diabetes mellitus with hyperglycemia: Secondary | ICD-10-CM

## 2019-05-04 DIAGNOSIS — E1110 Type 2 diabetes mellitus with ketoacidosis without coma: Secondary | ICD-10-CM

## 2019-05-04 LAB — BASIC METABOLIC PANEL
Anion gap: 10 (ref 5–15)
BUN: <5 mg/dL — ABNORMAL LOW (ref 6–20)
CO2: 25 mmol/L (ref 22–32)
Calcium: 9 mg/dL (ref 8.9–10.3)
Chloride: 104 mmol/L (ref 98–111)
Creatinine, Ser: 0.65 mg/dL (ref 0.61–1.24)
GFR calc Af Amer: >60 mL/min (ref >60)
GFR calc non Af Amer: >60 mL/min (ref >60)
Glucose, Bld: 131 mg/dL — ABNORMAL HIGH (ref 70–99)
Potassium: 3.3 mmol/L — ABNORMAL LOW (ref 3.5–5.1)
Sodium: 139 mmol/L (ref 135–145)

## 2019-05-04 LAB — GLUCOSE, CAPILLARY
Glucose-Capillary: 121 mg/dL — ABNORMAL HIGH (ref 70–99)
Glucose-Capillary: 147 mg/dL — ABNORMAL HIGH (ref 70–99)
Glucose-Capillary: 151 mg/dL — ABNORMAL HIGH (ref 70–99)

## 2019-05-04 LAB — PHOSPHORUS: Phosphorus: 3.9 mg/dL (ref 2.5–4.6)

## 2019-05-04 LAB — MAGNESIUM: Magnesium: 1.7 mg/dL (ref 1.7–2.4)

## 2019-05-04 MED ORDER — BLOOD GLUCOSE METER KIT: PACK | 0 refills | Status: DC

## 2019-05-04 MED ORDER — "INSULIN SYRINGE 31G X 5/16"" 0.5 ML MISC": 3 refills | Status: DC

## 2019-05-04 MED ORDER — METOPROLOL TARTRATE 25 MG PO TABS: 12.5000 mg | ORAL_TABLET | Freq: Two times a day (BID) | ORAL | 3 refills | Status: DC

## 2019-05-04 MED ORDER — LOSARTAN POTASSIUM 50 MG PO TABS: 50.0000 mg | ORAL_TABLET | Freq: Every day | ORAL | 3 refills | Status: DC

## 2019-05-04 MED ORDER — INSULIN ASPART 100 UNIT/ML ~~LOC~~ SOLN: 0.0000 [IU] | Freq: Three times a day (TID) | SUBCUTANEOUS | 11 refills | Status: DC

## 2019-05-04 MED ORDER — INSULIN GLARGINE 100 UNIT/ML ~~LOC~~ SOLN: 48.0000 [IU] | Freq: Every day | SUBCUTANEOUS | 11 refills | Status: DC

## 2019-05-04 MED ORDER — POTASSIUM CHLORIDE CRYS ER 20 MEQ PO TBCR
40.0000 meq | EXTENDED_RELEASE_TABLET | Freq: Once | ORAL | Status: AC
  Administered 2019-05-04: 40 meq via ORAL
  Filled 2019-05-04: qty 2

## 2019-05-04 MED ORDER — MAGNESIUM SULFATE IN D5W 1-5 GM/100ML-% IV SOLN
1.0000 g | Freq: Once | INTRAVENOUS | Status: AC
  Administered 2019-05-04: 1 g via INTRAVENOUS
  Filled 2019-05-04: qty 100

## 2019-05-04 MED ORDER — METFORMIN HCL 1000 MG PO TABS: ORAL_TABLET | ORAL | 2 refills | Status: DC

## 2019-05-04 MED FILL — !TRUE METRIX BLOOD GLUCOSE: 30 days supply | Qty: 1 | Fill #0

## 2019-05-04 MED FILL — TRUE METRIX TEST STRIP: 25 days supply | Qty: 100 | Fill #0

## 2019-05-04 MED FILL — METOPROLOL TARTRATE 25 MG T: 25 | 30 days supply | Qty: 30 | Fill #0

## 2019-05-04 MED FILL — metFORMIN HCL 1000 MG TABS: 1000 | 30 days supply | Qty: 60 | Fill #0

## 2019-05-04 MED FILL — !LANTUS 100 UNITS/ML VIAL: 100 | 20 days supply | Qty: 10 | Fill #0

## 2019-05-04 MED FILL — !NOVOLOG 100UNITS/ML VIAL: 100/ML | 16 days supply | Qty: 10 | Fill #0

## 2019-05-04 MED FILL — TRUEplus LANCETS 28G MISC: 25 days supply | Qty: 100 | Fill #0

## 2019-05-04 MED FILL — LOSARTAN POTASSIUM 50 MG TA: 50 | 30 days supply | Qty: 30 | Fill #0

## 2019-05-04 MED FILL — TRUEPLUS SYR 0.3ML 31GX5/16: 31G X 5/16" | 25 days supply | Qty: 100 | Fill #0

## 2019-05-04 NOTE — Progress Notes (Addendum)
Inpatient Diabetes Program Recommendations  AACE/ADA: New Consensus Statement on Inpatient Glycemic Control (2015)  Target Ranges:  Prepandial:   less than 140 mg/dL      Peak postprandial:   less than 180 mg/dL (1-2 hours)      Critically ill patients:  140 - 180 mg/dL   Results for Dylan Watts, Dylan Watts (MRN [id number]) as of 05/04/2019 08:25  Ref. Range 05/03/2019 06:34 05/03/2019 11:36 05/03/2019 16:09 05/03/2019 21:08  Glucose-Capillary Latest Ref Range: 70 - 99 mg/dL 133 (H)  9 units NOVOLOG  157 (H)  10 units NOVOLOG  182 (H)  14 units NOVOLOG  151 (H)    48 units LANTUS   Results for Dylan Watts, Dylan Watts (MRN [id number]) as of 05/04/2019 08:25  Ref. Range 05/04/2019 07:41  Glucose-Capillary Latest Ref Range: 70 - 99 mg/dL 147 (H)  13 units NOVOLOG    Results for Dylan Watts, Dylan Watts (MRN [id number]) as of 05/04/2019 08:25  Ref. Range 01/22/2019 09:41 05/01/2019 01:49  Hemoglobin A1C Latest Ref Range: 4.8 - 5.6 % 11.8 (A) 14.9 (H)  (380 mg/dl)    Admit with: DKA  History: DM 2 (however note that C-peptide is 1.0)  Home DM Meds: Glucotrol 5 mg bid       Metformin 1000 mg BID  Current Orders: Lantus 48 units QHS      Novolog Resistant Correction Scale/ SSI (0-20 units) TID AC + HS      Novolog 10 units TID    PCP: Cammie Fulp, NP ([facility])--last seen 02/05/2019--Pt declined starting Insulin at that visit  Can use [facility] pharmacy after d/c to fill meds.    Addendum 11am- Met w/ pt at bedside to review going home on insulin.  Re-reviewed Current A1c of 14.9% and need for insulin for home.  Pt told me he has experience using insulin in the past and is comfortable giving injections.  Prefers vial and syringe as that what he has used in the past.  Reviewed Lantus and Novolog insulins with pt.  Explained what each insulin is, how it works, when to take, etc.  Reviewed with pt how to use the Novolog SSI at home and reminded pt to always check his CBGs prior to taking the Novolog so he'll  know how much to take and to never guess at his insulin doses.  Pt has printed Rxs for Insulin syringes and CBG meter at bedside.  Other Rxs are being sent electronically.  Also reviewed CBG goals for home and Hypoglycemia signs and symptoms and treatment for HYPO.  Encouraged pt to maintain all appts at the [facility] and to call the clinic if he begins having issues w/ high or low CBGs.     --Will follow patient during hospitalization--  Wyn Quaker RN, MSN, CDE Diabetes Coordinator Inpatient Glycemic Control Team Team Pager: [phone] (8a-5p)

## 2019-05-04 NOTE — Progress Notes (Signed)
Pt discharged home with family member, taken to front of hospital via w/c to meet family member

## 2019-05-04 NOTE — Plan of Care (Signed)
  Problem: Health Behavior/Discharge Planning: Goal: Ability to manage health-related needs will improve Outcome: Adequate for Discharge   Problem: Education: Goal: Knowledge of General Education information will improve Description: Including pain rating scale, medication(s)/side effects and non-pharmacologic comfort measures Outcome: Adequate for Discharge   Problem: Clinical Measurements: Goal: Ability to maintain clinical measurements within normal limits will improve Outcome: Adequate for Discharge Goal: Will remain free from infection Outcome: Adequate for Discharge Goal: Diagnostic test results will improve Outcome: Adequate for Discharge Goal: Respiratory complications will improve Outcome: Adequate for Discharge Goal: Cardiovascular complication will be avoided Outcome: Adequate for Discharge   Problem: Activity: Goal: Risk for activity intolerance will decrease Outcome: Adequate for Discharge   Problem: Nutrition: Goal: Adequate nutrition will be maintained Outcome: Adequate for Discharge   Problem: Coping: Goal: Level of anxiety will decrease Outcome: Adequate for Discharge   Problem: Elimination: Goal: Will not experience complications related to bowel motility Outcome: Adequate for Discharge Goal: Will not experience complications related to urinary retention Outcome: Adequate for Discharge   Problem: Pain Managment: Goal: General experience of comfort will improve Outcome: Adequate for Discharge   Problem: Safety: Goal: Ability to remain free from injury will improve Outcome: Adequate for Discharge   Problem: Skin Integrity: Goal: Risk for impaired skin integrity will decrease Outcome: Adequate for Discharge   

## 2019-05-04 NOTE — Discharge Summary (Addendum)
Physician Discharge Summary  Dylan Watts FUX:[id number] DOB: 1991/01/23 DOA: 04/30/2019  PCP: Antony Blackbird, MD  Admit date: 04/30/2019 Discharge date: 05/04/2019  Time spent: 50 minutes  Recommendations for Outpatient Follow-up:  1. Follow-up PCP in 1 week   Discharge Diagnoses:  Principal Problem:   DKA (diabetic ketoacidoses) ([place]) Active Problems:   Hyperlipidemia   Nausea & vomiting   Hypertension   Discharge Condition: Stable  Diet recommendation: Carb modified diet  Filed Weights   04/30/19 0357 04/30/19 1102  Weight: (!) 150 kg (!) 151.8 kg    History of present illness:  29 year old African-American male with past medical history of diabetes mellitus, type II, morbid obesity, hypertension presented with DKA.  He was started on DKA protocol with IV insulin.  DKA has resolved.  Patient is currently on Lantus 48 units subcu daily.  Hospital Course:   Diabetic ketoacidosis-patient has poorly controlled diabetes mellitus, hemoglobin A1c 14.0.  pH was 7.0, bicarb 8, anion gap was 25 at the time of presentation.  He was transition from IV insulin to basal insulin with Lantus 48 units subcu daily.  Also received 10 units 3 times daily with meal coverage.   Uncontrolled diabetes mellitus-hemoglobin A1c 14.0, C-peptide was low 1.0.  Indicating decreased endogenous production of insulin.  GAD antibodies, islet cell antibodies were negative.  Patient was on Metformin before at presentation.  At this time we will discharge him on Lantus 48 units subcu daily, sliding scale insulin with NovoLog, and 0 to 20 units.  Metformin 1000 mg daily to increase to 1000 mg twice a day in 1 week.  Hypertension-blood pressure is stable, has mild tachycardia.  He was supposed to be on metoprolol 25 mg twice a day at home, which he has not been taking.  In the hospital we decreased the dose of Cozaar to 50 mg daily, still has mildly elevated diastolic blood pressure along with mild tachycardia.  Will  restart metoprolol at 12.5 mg twice a day along with Cozaar 50 mg daily.  Hypokalemia-potassium is 3.3, will replace potassium today before discharge.  Hypomagnesemia-magnesium 1.7 today, will give 1 g of magnesium sulfate IV x1 before discharge.    Procedures:    Consultations:    Discharge Exam: Vitals:   05/04/19 0012 05/04/19 0801  BP: 135/86 128/88  Pulse: 96   Resp:  10  Temp: 98.4 F (36.9 C) 98.2 F (36.8 C)  SpO2:      General: Appears in no acute distress Cardiovascular: S1-S2, regular Respiratory: Clear to auscultation bilaterally  Discharge Instructions   Discharge Instructions    Diet - low sodium heart healthy   Complete by: As directed    Increase activity slowly   Complete by: As directed      Allergies as of 05/04/2019   No Known Allergies     Medication List    STOP taking these medications   glipiZIDE 5 MG tablet Commonly known as: GLUCOTROL     TAKE these medications   atorvastatin 20 MG tablet Commonly known as: LIPITOR Take 1 tablet (20 mg total) by mouth daily.   blood glucose meter kit and supplies Dispense based on patient and insurance preference. Use up to four times daily as directed. (FOR ICD-9 250.00, 250.01).   insulin aspart 100 UNIT/ML injection Commonly known as: novoLOG Inject 0-20 Units into the skin 3 (three) times daily with meals. Sliding scale insulin Less than 70 initiate hypoglycemia protocol 70-120  0 units 120-150 3 unit 151-200 4 units  201-250 7 units 251-300 11 units 301-350 15 units 351-400 20 units  Greater than 400 call MD   insulin glargine 100 UNIT/ML injection Commonly known as: LANTUS Inject 0.48 mLs (48 Units total) into the skin at bedtime.   INSULIN SYRINGE .5CC/31GX5/16" 31G X 5/16" 0.5 ML Misc Use as directed   losartan 50 MG tablet Commonly known as: COZAAR Take 1 tablet (50 mg total) by mouth daily. What changed:   medication strength  how much to take   metFORMIN 1000  MG tablet Commonly known as: GLUCOPHAGE Take 1 tab po every morning for 1 week. After 1 week, may increase to 1 tab po BID.   metoprolol tartrate 25 MG tablet Commonly known as: LOPRESSOR Take 0.5 tablets (12.5 mg total) by mouth 2 (two) times daily. What changed: how much to take   multivitamin with minerals tablet Take 1 tablet by mouth daily.   venlafaxine XR 75 MG 24 hr capsule Commonly known as: EFFEXOR-XR Take 1 capsule (75 mg total) by mouth daily with breakfast. To help with anxiety   vitamin E 180 MG (400 UNITS) capsule Generic drug: vitamin E Take 400 Units by mouth daily.      No Known Allergies Follow-up Information    Fulp, Cammie, MD Follow up in 1 week(s).   Specialty: Family Medicine Contact information: [place] [address] [phone]            The results of significant diagnostics from this hospitalization (including imaging, microbiology, ancillary and laboratory) are listed below for reference.    Significant Diagnostic Studies: No results found.  Microbiology: Recent Results (from the past 240 hour(s))  Respiratory Panel by RT PCR (Flu A&B, Covid) - Nasopharyngeal Swab     Status: None   Collection Time: 04/30/19  7:02 AM   Specimen: Nasopharyngeal Swab  Result Value Ref Range Status   SARS Coronavirus 2 by RT PCR NEGATIVE NEGATIVE Final    Comment: (NOTE) SARS-CoV-2 target nucleic acids are NOT DETECTED. The SARS-CoV-2 RNA is generally detectable in upper respiratoy specimens during the acute phase of infection. The lowest concentration of SARS-CoV-2 viral copies this assay can detect is 131 copies/mL. A negative result does not preclude SARS-Cov-2 infection and should not be used as the sole basis for treatment or other patient management decisions. A negative result may occur with  improper specimen collection/handling, submission of specimen other than nasopharyngeal swab, presence of viral mutation(s) within  the areas targeted by this assay, and inadequate number of viral copies (<131 copies/mL). A negative result must be combined with clinical observations, patient history, and epidemiological information. The expected result is Negative. Fact Sheet for Patients:  PinkCheek.be Fact Sheet for Healthcare Providers:  GravelBags.it This test is not yet ap proved or cleared by the [place] FDA and  has been authorized for detection and/or diagnosis of SARS-CoV-2 by FDA under an Emergency Use Authorization (EUA). This EUA will remain  in effect (meaning this test can be used) for the duration of the COVID-19 declaration under Section 564(b)(1) of the Act, 21 U.S.C. section 360bbb-3(b)(1), unless the authorization is terminated or revoked sooner.    Influenza A by PCR NEGATIVE NEGATIVE Final   Influenza B by PCR NEGATIVE NEGATIVE Final    Comment: (NOTE) The Xpert Xpress SARS-CoV-2/FLU/RSV assay is intended as an aid in  the diagnosis of influenza from Nasopharyngeal swab specimens and  should not be used as a sole basis for treatment. Nasal washings and  aspirates are unacceptable for Xpert Xpress SARS-CoV-2/FLU/RSV  testing. Fact Sheet for Patients: PinkCheek.be Fact Sheet for Healthcare Providers: GravelBags.it This test is not yet approved or cleared by the [place] FDA and  has been authorized for detection and/or diagnosis of SARS-CoV-2 by  FDA under an Emergency Use Authorization (EUA). This EUA will remain  in effect (meaning this test can be used) for the duration of the  Covid-19 declaration under Section 564(b)(1) of the Act, 21  U.S.C. section 360bbb-3(b)(1), unless the authorization is  terminated or revoked. Performed at [facility], [place] [address] [place], [place] [address]   MRSA PCR Screening     Status: None   Collection Time: 04/30/19  11:14 AM   Specimen: Nasal Mucosa; Nasopharyngeal  Result Value Ref Range Status   MRSA by PCR NEGATIVE NEGATIVE Final    Comment:        The GeneXpert MRSA Assay (FDA approved for NASAL specimens only), is one component of a comprehensive MRSA colonization surveillance program. It is not intended to diagnose MRSA infection nor to guide or monitor treatment for MRSA infections. Performed at [facility], [place] [address] [place], [place] [address]      Labs: Basic Metabolic Panel: Recent Labs  Lab 05/01/19 0149 05/01/19 1219 05/02/19 0448 05/02/19 0805 05/02/19 1735 05/03/19 0054 05/04/19 0200  NA 135   < > 138 138 136 138 139  K 3.4*   < > 2.7* 2.6* 3.4* 3.6 3.3*  CL 106   < > 108 109 108 107 104  CO2 14*   < > 19* 19* 18* 19* 25  GLUCOSE 167*   < > 153* 184* 221* 168* 131*  BUN <5*   < > <5* <5* <5* <5* <5*  CREATININE 1.10   < > 0.83 0.82 0.69 0.63 0.65  CALCIUM 9.6   < > 8.7* 8.6* 8.3* 8.4* 9.0  MG 1.8  --  1.6*  --  2.1 1.9 1.7  PHOS 1.1*  --  1.8*  --   --  2.8 3.9   < > = values in this interval not displayed.   Liver Function Tests: Recent Labs  Lab 04/30/19 0439 05/01/19 0149  AST 13* 10*  10*  ALT '17 15  12  '$ ALKPHOS 125 112  104  BILITOT 1.6* 1.4*  1.6*  PROT 10.0* 8.7*  8.7*  ALBUMIN 3.9 3.6  3.3*   Recent Labs  Lab 04/30/19 0439  LIPASE 33   No results for input(s): AMMONIA in the last 168 hours. CBC: Recent Labs  Lab 04/30/19 0439 04/30/19 0544 05/01/19 0149  WBC 16.3*  --  10.2  NEUTROABS 13.5*  --   --   HGB 16.1 17.3* 15.1  HCT 48.7 51.0 44.9  MCV 90.2  --  87.5  PLT 212  --  182    CBG: Recent Labs  Lab 05/03/19 1136 05/03/19 1609 05/03/19 2108 05/04/19 0607 05/04/19 0741  GLUCAP 157* 182* 151* 121* 147*       Signed:  Oswald Hillock MD.  [facility] 05/04/2019, 10:03 AM

## 2019-05-05 ENCOUNTER — Telehealth: Payer: Self-pay

## 2019-05-05 NOTE — Telephone Encounter (Signed)
Transition Care Management Follow-up Telephone Call Date of discharge and from where: 05/04/2019, [facility]      Call placed to the patient # [phone] , message left with call back requested to this CM .   Patient needs to schedule follow up appt.

## 2019-05-06 ENCOUNTER — Telehealth: Payer: Self-pay

## 2019-05-06 NOTE — Telephone Encounter (Signed)
Transition Care Management Follow-up Telephone Call  Date of discharge and from where: 05/04/2019, [facility]  How have you been since you were released from the hospital? He said he is " doing better."  Any questions or concerns? None at this time  He said he has applied for medicaid and has already spoken to [facility] pharmacy about patient assistance programs. He a document that he needs to sign and return to them.   Items Reviewed:  Did the pt receive and understand the discharge instructions provided? yes, he has the instructions and does not have any questions.   Medications obtained and verified? he said that he has all medications including both insulins.  He did not have any questions or want to review the list.He said that he understood that were are changes with losartan and metoprolol and he is to stop taking the glipizide. He was able to state the correct order for lantus and explained that he administers the novolog per sliding scale. He said that he was on insulin about 2 years ago so he is familiar with insulin injections. He reported that the insulin was stopped at that time and he was started on metformin.   Any new allergies since your discharge? none reported  Do you have support at home? yes, has roommate  Other (ie: DME, Home Health, etc) no home health ordered.   He has a glucometer. Blood sugar this morning was 197.   Functional Questionnaire: (I = Independent and D = Dependent) ADL's: independent   Follow up appointments reviewed:    PCP Hospital f/u appt confirmed? Scheduled appt with Dr Jillyn Hidden - 05/14/2019 @ 1050  Specialist Hospital f/u appt confirmed? .none scheduled at this time  Are transportation arrangements needed? no, he drives  If their condition worsens, is the pt aware to call  their PCP or go to the ED? yes  Was the patient provided with contact information for the PCP's office or ED?he has the clinic phone number  Was the pt encouraged  to call back with questions or concerns? yes

## 2019-05-15 ENCOUNTER — Ambulatory Visit: Payer: Self-pay | Admitting: Family Medicine

## 2019-07-14 MED FILL — LANTUS 100 UNITS/ML VIAL: 100 | 20 days supply | Qty: 10 | Fill #1

## 2019-07-14 MED FILL — NovoLOG 100 UNIT/ML SOLN: 100 | 16 days supply | Qty: 10 | Fill #1

## 2019-08-20 MED FILL — !LANTUS 100 UNITS/ML VIAL: 100 | 20 days supply | Qty: 10 | Fill #2

## 2019-08-20 MED FILL — NovoLOG 100 UNIT/ML SOLN: 100 | 16 days supply | Qty: 10 | Fill #2

## 2019-09-15 MED FILL — NovoLOG 100 UNIT/ML SOLN: 100 | 16 days supply | Qty: 10 | Fill #3

## 2019-09-15 MED FILL — LANTUS 100 UNITS/ML VIAL: 100 | 20 days supply | Qty: 10 | Fill #3

## 2019-10-05 ENCOUNTER — Ambulatory Visit (HOSPITAL_BASED_OUTPATIENT_CLINIC_OR_DEPARTMENT_OTHER): Payer: Self-pay | Admitting: Family

## 2019-10-05 DIAGNOSIS — Z5329 Procedure and treatment not carried out because of patient's decision for other reasons: Secondary | ICD-10-CM

## 2019-10-05 NOTE — Progress Notes (Signed)
Patient did not show for appointment.   

## 2019-11-12 MED FILL — LANTUS 100 UNITS/ML VIAL: 100 | 20 days supply | Qty: 10 | Fill #4

## 2019-11-12 MED FILL — NovoLOG 100 UNIT/ML SOLN: 100 | 16 days supply | Qty: 10 | Fill #4

## 2019-12-14 ENCOUNTER — Encounter: Payer: Self-pay | Admitting: Family

## 2019-12-14 NOTE — Progress Notes (Signed)
Patient did not show for appointment.   

## 2019-12-31 MED FILL — NovoLOG 100 UNIT/ML SOLN: 100 | 16 days supply | Qty: 10 | Fill #5

## 2019-12-31 MED FILL — LANTUS 100 UNITS/ML VIAL: 100 | 20 days supply | Qty: 10 | Fill #5

## 2020-01-21 IMAGING — MR MR HEAD W/O CM
10 of 11 series · 43 of 48 positions shown · non-contrast
Comparison: None.

CLINICAL DATA: Numbness or tingling, paresthesia in the right hand
and forearm. Symptoms for a week.

EXAM:
MRI HEAD WITHOUT CONTRAST
TECHNIQUE: Multiplanar, multiecho pulse sequences of the brain and surrounding
structures were obtained without intravenous contrast.

[Series 5: DWI · axial · 4.0mm · 0.88mm/px · z∈[-87,+65]mm · 8 of 78 slices shown (1 of 2)]
[im 1/78]
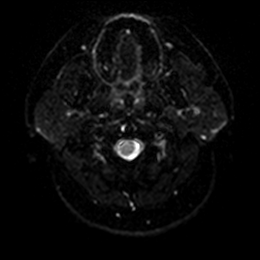
[im 12/78]
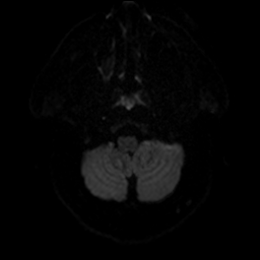
[im 23/78]
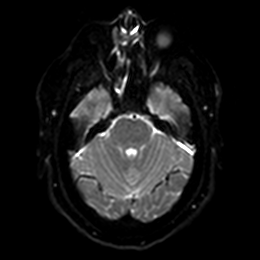
[im 34/78]
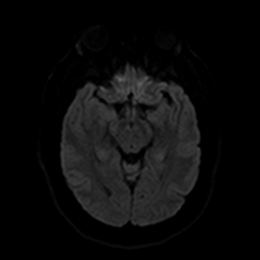
[im 45/78]
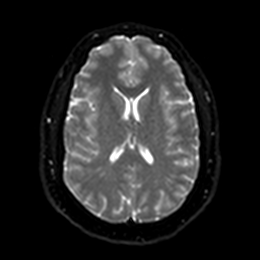
[im 56/78]
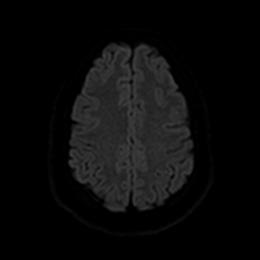
[im 67/78]
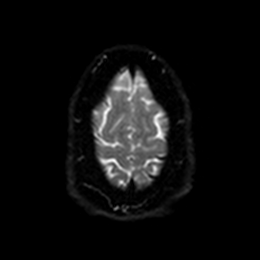
[im 78/78]
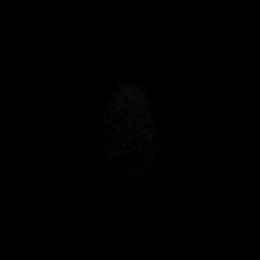

[Series 6: DWI · axial · 4.0mm · 0.88mm/px · z∈[-87,+65]mm · 4 of 39 slices shown (2 of 2)]
[im 1/39]
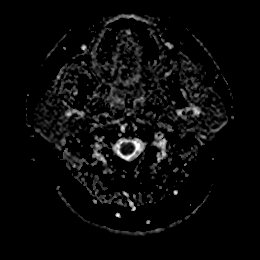
[im 13/39]
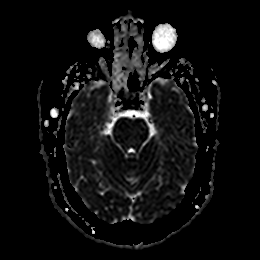
[im 26/39]
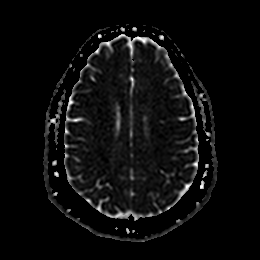
[im 39/39]
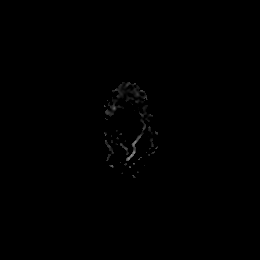

[Series 7: cor dwi_tracew · coronal · 5.0mm · 1.44mm/px · 7 of 68 slices shown]
[im 1/68]
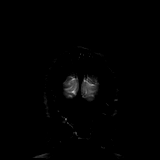
[im 12/68]
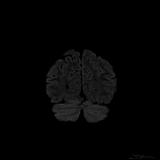
[im 23/68]
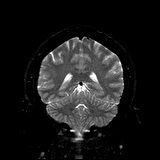
[im 34/68]
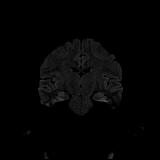
[im 45/68]
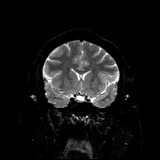
[im 56/68]
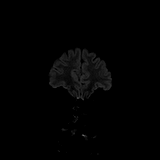
[im 68/68]
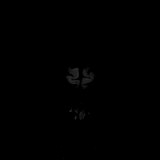

[Series 8: cor dwi_adc · coronal · 5.0mm · 1.44mm/px · 3 of 34 slices shown]
[im 1/34]
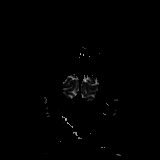
[im 17/34]
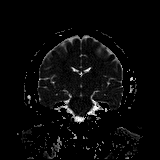
[im 34/34]
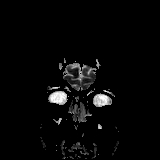

[Series 9: T1 · sagittal · 5.0mm · 0.75mm/px · 2 of 23 slices shown]
[im 1/23]
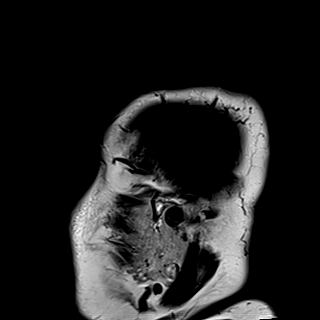
[im 23/23]
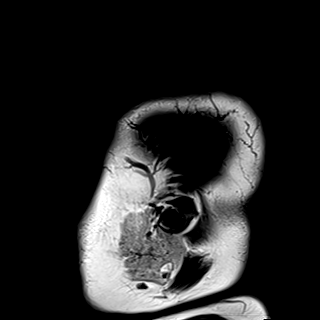

[Series 10: T2 · axial · 5.0mm · 0.72mm/px · z∈[-88,+80]mm · 3 of 29 slices shown (1 of 2)]
[im 1/29]
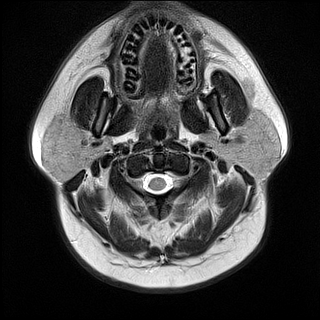
[im 15/29]
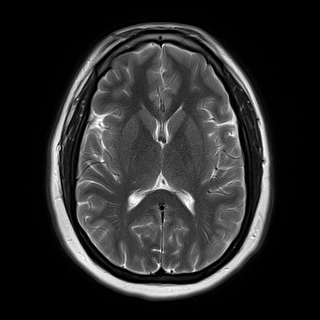
[im 29/29]
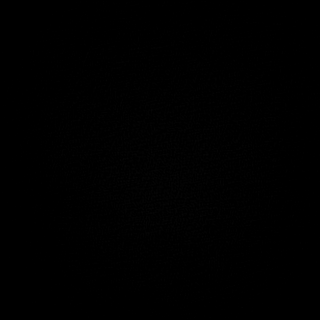

[Series 11: FLAIR · axial · 5.0mm · 0.45mm/px · z∈[-87,+81]mm · 3 of 29 slices shown]
[im 1/29]
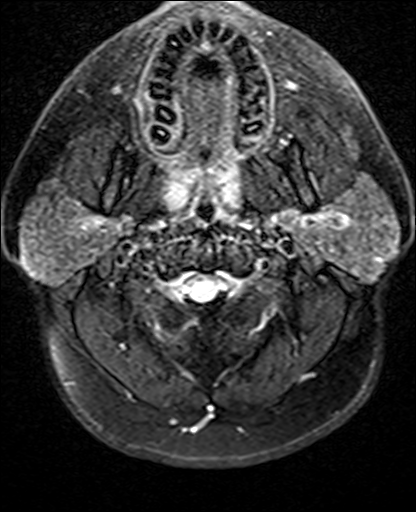
[im 15/29]
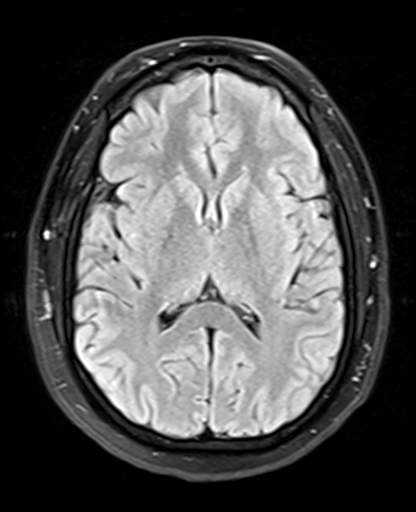
[im 29/29]
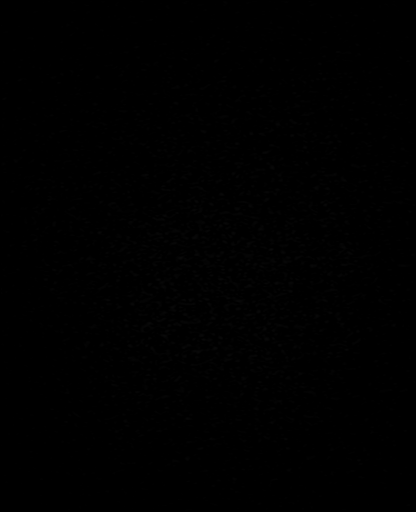

[Series 12: swi_images · axial · 3.0mm · 0.90mm/px · z∈[-73,+80]mm · 5 of 52 slices shown]
[im 1/52]
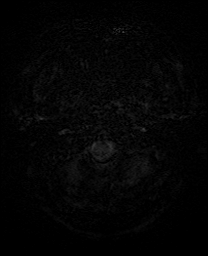
[im 13/52]
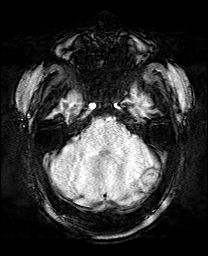
[im 26/52]
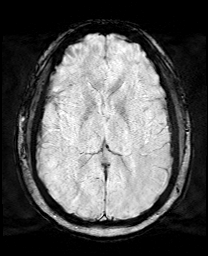
[im 39/52]
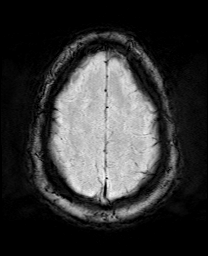
[im 52/52]
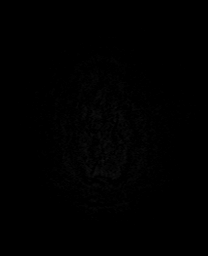

[Series 13: mip_images(sw) · axial · 24.0mm · 0.90mm/px · z∈[-62,+70]mm · 5 of 45 slices shown]
[im 1/45]
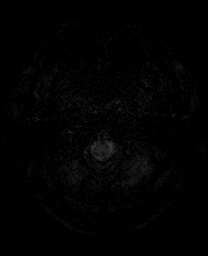
[im 12/45]
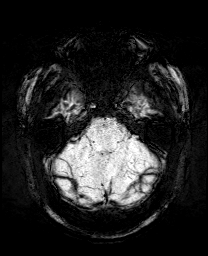
[im 23/45]
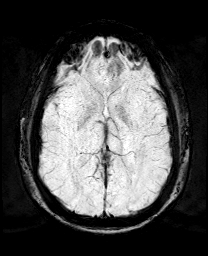
[im 34/45]
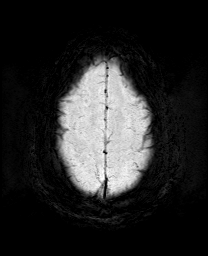
[im 45/45]
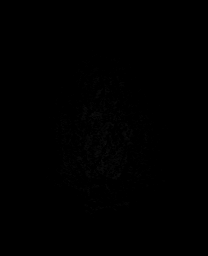

[Series 15: T2 · coronal · 5.0mm · 0.34mm/px · 3 of 30 slices shown (2 of 2)]
[im 1/30]
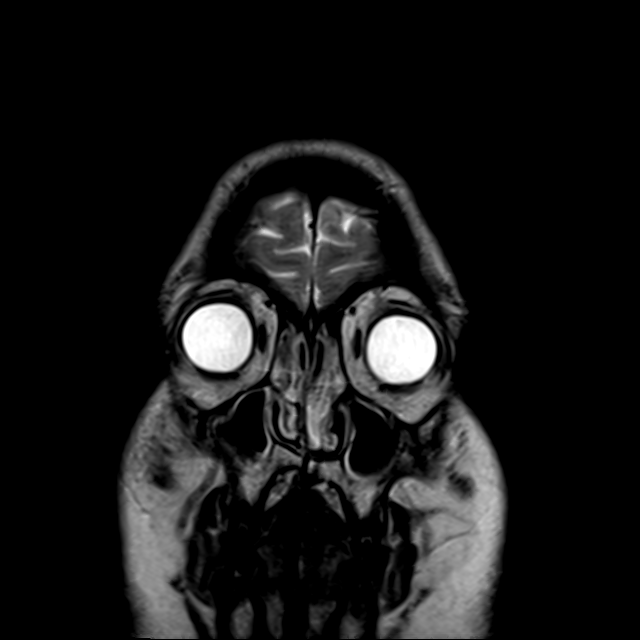
[im 15/30]
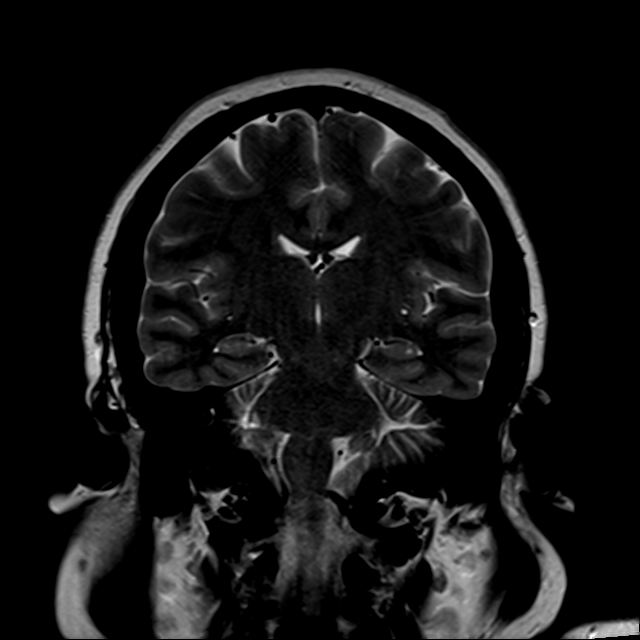
[im 30/30]
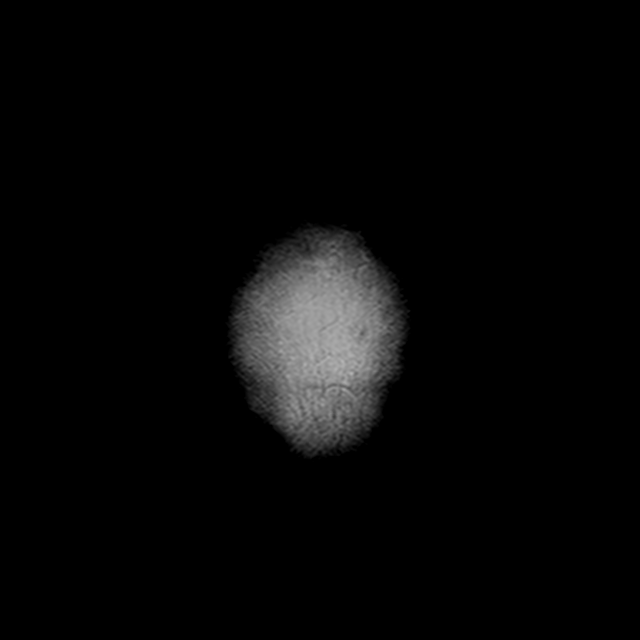

[43 of 48 positions shown; findings below may reference images not displayed]

FINDINGS: Brain: No acute infarction, hemorrhage, hydrocephalus, extra-axial
collection or mass lesion. 7 mm cerebellar tonsillar ectopia without
foramen magnum stenosis to strongly suggest Chiari malformation.

Vascular: Major flow voids are preserved

Skull and upper cervical spine: No focal marrow lesion. Low marrow
signal in the cervical spine which is likely red marrow and likely
from anemia or from patient's history of obesity. CBC has been
obtained today.

Sinuses/Orbits: Mucosal thickening in the paranasal sinuses with
polypoid T2 hyperintensities in the bilateral nasal cavity, larger
on the left.
IMPRESSION: 1. No acute finding.  No infarct or demyelination.
2. Chronic sinusitis with probable nasal cavity polyps.
3. Cerebellar tonsillar ectopia without foramen magnum stenosis to
imply Chiari malformation.

## 2020-01-27 MED FILL — LANTUS 100 UNITS/ML VIAL: 100 | 20 days supply | Qty: 10 | Fill #6

## 2020-01-27 MED FILL — NovoLOG 100 UNIT/ML SOLN: 100 | 16 days supply | Qty: 10 | Fill #6

## 2020-03-03 MED FILL — TRUEPLUS SYR 0.3ML 31GX5/16: 31G X 5/16" | 25 days supply | Qty: 100 | Fill #1

## 2020-03-03 MED FILL — LANTUS 100 UNITS/ML VIAL: 100 | 20 days supply | Qty: 10 | Fill #7

## 2020-03-03 MED FILL — NovoLOG 100 UNIT/ML SOLN: 100 | 16 days supply | Qty: 10 | Fill #7

## 2020-04-13 MED FILL — LANTUS 100 UNITS/ML VIAL: 100 | 20 days supply | Qty: 10 | Fill #8

## 2020-04-13 MED FILL — NovoLOG 100 UNIT/ML SOLN: 100 | 16 days supply | Qty: 10 | Fill #8

## 2022-08-29 ENCOUNTER — Other Ambulatory Visit: Payer: Self-pay

## 2022-08-29 ENCOUNTER — Encounter: Payer: Self-pay | Admitting: Emergency Medicine

## 2022-08-29 ENCOUNTER — Inpatient Hospital Stay
Admission: AD | Admit: 2022-08-29 | Discharge: 2022-09-03 | DRG: 881 | Disposition: A | Discharge status: Home / Self Care | Payer: No Typology Code available for payment source | Source: Intra-hospital | Attending: Psychiatry | Admitting: Psychiatry

## 2022-08-29 ENCOUNTER — Encounter: Payer: Self-pay | Admitting: Psychiatry

## 2022-08-29 ENCOUNTER — Emergency Department
Admission: EM | Admit: 2022-08-29 | Discharge: 2022-08-29 | Disposition: A | Discharge status: Other Acute Inpatient Hospital | Payer: No Typology Code available for payment source | Attending: Student in an Organized Health Care Education/Training Program | Admitting: Student in an Organized Health Care Education/Training Program

## 2022-08-29 DIAGNOSIS — Z7984 Long term (current) use of oral hypoglycemic drugs: Secondary | ICD-10-CM | POA: Diagnosis not present

## 2022-08-29 DIAGNOSIS — Z91128 Patient's intentional underdosing of medication regimen for other reason: Secondary | ICD-10-CM

## 2022-08-29 DIAGNOSIS — T383X6A Underdosing of insulin and oral hypoglycemic [antidiabetic] drugs, initial encounter: Secondary | ICD-10-CM | POA: Diagnosis present

## 2022-08-29 DIAGNOSIS — Z7289 Other problems related to lifestyle: Secondary | ICD-10-CM

## 2022-08-29 DIAGNOSIS — Z6841 Body Mass Index (BMI) 40.0 and over, adult: Secondary | ICD-10-CM

## 2022-08-29 DIAGNOSIS — Z79899 Other long term (current) drug therapy: Secondary | ICD-10-CM | POA: Diagnosis not present

## 2022-08-29 DIAGNOSIS — E1165 Type 2 diabetes mellitus with hyperglycemia: Secondary | ICD-10-CM | POA: Insufficient documentation

## 2022-08-29 DIAGNOSIS — Z8249 Family history of ischemic heart disease and other diseases of the circulatory system: Secondary | ICD-10-CM | POA: Diagnosis not present

## 2022-08-29 DIAGNOSIS — Z23 Encounter for immunization: Secondary | ICD-10-CM | POA: Insufficient documentation

## 2022-08-29 DIAGNOSIS — F1020 Alcohol dependence, uncomplicated: Secondary | ICD-10-CM | POA: Diagnosis present

## 2022-08-29 DIAGNOSIS — Z79890 Hormone replacement therapy (postmenopausal): Secondary | ICD-10-CM | POA: Diagnosis not present

## 2022-08-29 DIAGNOSIS — X781XXA Intentional self-harm by knife, initial encounter: Secondary | ICD-10-CM | POA: Diagnosis present

## 2022-08-29 DIAGNOSIS — S61512A Laceration without foreign body of left wrist, initial encounter: Secondary | ICD-10-CM | POA: Diagnosis present

## 2022-08-29 DIAGNOSIS — F410 Panic disorder [episodic paroxysmal anxiety] without agoraphobia: Secondary | ICD-10-CM | POA: Diagnosis present

## 2022-08-29 DIAGNOSIS — X789XXA Intentional self-harm by unspecified sharp object, initial encounter: Secondary | ICD-10-CM | POA: Insufficient documentation

## 2022-08-29 DIAGNOSIS — F332 Major depressive disorder, recurrent severe without psychotic features: Secondary | ICD-10-CM | POA: Diagnosis not present

## 2022-08-29 DIAGNOSIS — Z560 Unemployment, unspecified: Secondary | ICD-10-CM

## 2022-08-29 DIAGNOSIS — E119 Type 2 diabetes mellitus without complications: Secondary | ICD-10-CM

## 2022-08-29 DIAGNOSIS — Y907 Blood alcohol level of 200-239 mg/100 ml: Secondary | ICD-10-CM | POA: Diagnosis present

## 2022-08-29 DIAGNOSIS — Z833 Family history of diabetes mellitus: Secondary | ICD-10-CM | POA: Diagnosis not present

## 2022-08-29 DIAGNOSIS — F329 Major depressive disorder, single episode, unspecified: Secondary | ICD-10-CM

## 2022-08-29 DIAGNOSIS — R739 Hyperglycemia, unspecified: Secondary | ICD-10-CM

## 2022-08-29 DIAGNOSIS — R45851 Suicidal ideations: Secondary | ICD-10-CM

## 2022-08-29 LAB — COMPREHENSIVE METABOLIC PANEL
ALT: 28 U/L (ref 0–44)
AST: 22 U/L (ref 15–41)
Albumin: 4.1 g/dL (ref 3.5–5.0)
Alkaline Phosphatase: 158 U/L — ABNORMAL HIGH (ref 38–126)
Anion gap: 16 — ABNORMAL HIGH (ref 5–15)
BUN: 10 mg/dL (ref 6–20)
CO2: 17 mmol/L — ABNORMAL LOW (ref 22–32)
Calcium: 9.3 mg/dL (ref 8.9–10.3)
Chloride: 100 mmol/L (ref 98–111)
Creatinine, Ser: 0.64 mg/dL (ref 0.61–1.24)
GFR, Estimated: >60 mL/min (ref >60)
Glucose, Bld: 397 mg/dL — ABNORMAL HIGH (ref 70–99)
Potassium: 3.7 mmol/L (ref 3.5–5.1)
Sodium: 133 mmol/L — ABNORMAL LOW (ref 135–145)
Total Bilirubin: 0.5 mg/dL (ref 0.3–1.2)
Total Protein: 9.4 g/dL — ABNORMAL HIGH (ref 6.5–8.1)

## 2022-08-29 LAB — GLUCOSE, CAPILLARY
Glucose-Capillary: 232 mg/dL — ABNORMAL HIGH (ref 70–99)
Glucose-Capillary: 343 mg/dL — ABNORMAL HIGH (ref 70–99)

## 2022-08-29 LAB — URINE DRUG SCREEN, QUALITATIVE (ARMC ONLY)
Amphetamines, Ur Screen: NOT DETECTED
Barbiturates, Ur Screen: NOT DETECTED
Benzodiazepine, Ur Scrn: NOT DETECTED
Cannabinoid 50 Ng, Ur ~~LOC~~: NOT DETECTED
Cocaine Metabolite,Ur ~~LOC~~: NOT DETECTED
MDMA (Ecstasy)Ur Screen: NOT DETECTED
Methadone Scn, Ur: NOT DETECTED
Opiate, Ur Screen: NOT DETECTED
Phencyclidine (PCP) Ur S: NOT DETECTED
Tricyclic, Ur Screen: NOT DETECTED

## 2022-08-29 LAB — BASIC METABOLIC PANEL
Anion gap: 14 (ref 5–15)
BUN: 7 mg/dL (ref 6–20)
CO2: 18 mmol/L — ABNORMAL LOW (ref 22–32)
Calcium: 8.6 mg/dL — ABNORMAL LOW (ref 8.9–10.3)
Chloride: 105 mmol/L (ref 98–111)
Creatinine, Ser: 0.68 mg/dL (ref 0.61–1.24)
GFR, Estimated: >60 mL/min (ref >60)
Glucose, Bld: 270 mg/dL — ABNORMAL HIGH (ref 70–99)
Potassium: 3.5 mmol/L (ref 3.5–5.1)
Sodium: 137 mmol/L (ref 135–145)

## 2022-08-29 LAB — BLOOD GAS, VENOUS
Acid-base deficit: 4.1 mmol/L — ABNORMAL HIGH (ref 0.0–2.0)
Bicarbonate: 21.6 mmol/L (ref 20.0–28.0)
O2 Saturation: 88.1 %
Patient temperature: 37
pCO2, Ven: 41 mmHg — ABNORMAL LOW (ref 44–60)
pH, Ven: 7.33 (ref 7.25–7.43)
pO2, Ven: 56 mmHg — ABNORMAL HIGH (ref 32–45)

## 2022-08-29 LAB — ACETAMINOPHEN LEVEL: Acetaminophen (Tylenol), Serum: <10 ug/mL — ABNORMAL LOW (ref 10–30)

## 2022-08-29 LAB — CBC
HCT: 43.9 % (ref 39.0–52.0)
Hemoglobin: 14.8 g/dL (ref 13.0–17.0)
MCH: 29.3 pg (ref 26.0–34.0)
MCHC: 33.7 g/dL (ref 30.0–36.0)
MCV: 86.9 fL (ref 80.0–100.0)
Platelets: 340 10*3/uL (ref 150–400)
RBC: 5.05 MIL/uL (ref 4.22–5.81)
RDW: 11.7 % (ref 11.5–15.5)
WBC: 11.3 10*3/uL — ABNORMAL HIGH (ref 4.0–10.5)
nRBC: 0 % (ref 0.0–0.2)

## 2022-08-29 LAB — SALICYLATE LEVEL: Salicylate Lvl: <7 mg/dL — ABNORMAL LOW (ref 7.0–30.0)

## 2022-08-29 LAB — ETHANOL: Alcohol, Ethyl (B): 129 mg/dL — ABNORMAL HIGH (ref <10)

## 2022-08-29 LAB — CBG MONITORING, ED: Glucose-Capillary: 258 mg/dL — ABNORMAL HIGH (ref 70–99)

## 2022-08-29 LAB — BETA-HYDROXYBUTYRIC ACID: Beta-Hydroxybutyric Acid: 0.39 mmol/L — ABNORMAL HIGH (ref 0.05–0.27)

## 2022-08-29 LAB — HEMOGLOBIN A1C
Hgb A1c MFr Bld: 13.1 % — ABNORMAL HIGH (ref 4.8–5.6)
Mean Plasma Glucose: 329.27 mg/dL

## 2022-08-29 MED ORDER — ATORVASTATIN CALCIUM 20 MG PO TABS
20.0000 mg | ORAL_TABLET | Freq: Every day | ORAL | Status: DC
  Administered 2022-08-30 – 2022-09-03 (×5): 20 mg via ORAL
  Filled 2022-08-29 (×5): qty 1

## 2022-08-29 MED ORDER — INSULIN ASPART 100 UNIT/ML IJ SOLN
10.0000 [IU] | Freq: Once | INTRAMUSCULAR | Status: AC
  Administered 2022-08-29: 10 [IU] via SUBCUTANEOUS
  Filled 2022-08-29: qty 1

## 2022-08-29 MED ORDER — INSULIN GLARGINE-YFGN 100 UNIT/ML ~~LOC~~ SOLN: 19.0000 [IU] | Freq: Every day | SUBCUTANEOUS | Status: DC

## 2022-08-29 MED ORDER — INSULIN ASPART 100 UNIT/ML IJ SOLN: 0.0000 [IU] | Freq: Every day | INTRAMUSCULAR | Status: DC

## 2022-08-29 MED ORDER — HYDROXYZINE HCL 25 MG PO TABS: 25.0000 mg | ORAL_TABLET | Freq: Three times a day (TID) | ORAL | Status: DC | PRN

## 2022-08-29 MED ORDER — INSULIN ASPART 100 UNIT/ML IJ SOLN: 0.0000 [IU] | Freq: Three times a day (TID) | INTRAMUSCULAR | Status: DC

## 2022-08-29 MED ORDER — HALOPERIDOL 5 MG PO TABS: 5.0000 mg | ORAL_TABLET | Freq: Three times a day (TID) | ORAL | Status: DC | PRN

## 2022-08-29 MED ORDER — LORAZEPAM 2 MG/ML IJ SOLN: 2.0000 mg | Freq: Three times a day (TID) | INTRAMUSCULAR | Status: DC | PRN

## 2022-08-29 MED ORDER — ALUM & MAG HYDROXIDE-SIMETH 200-200-20 MG/5ML PO SUSP: 30.0000 mL | ORAL | Status: DC | PRN

## 2022-08-29 MED ORDER — INSULIN ASPART 100 UNIT/ML IJ SOLN
7.0000 [IU] | Freq: Three times a day (TID) | INTRAMUSCULAR | Status: DC
  Administered 2022-08-30: 7 [IU] via SUBCUTANEOUS
  Filled 2022-08-29: qty 1

## 2022-08-29 MED ORDER — TRAZODONE HCL 50 MG PO TABS
50.0000 mg | ORAL_TABLET | Freq: Every evening | ORAL | Status: DC | PRN
  Administered 2022-08-29: 50 mg via ORAL
  Filled 2022-08-29: qty 1

## 2022-08-29 MED ORDER — METFORMIN HCL 500 MG PO TABS
1000.0000 mg | ORAL_TABLET | Freq: Every day | ORAL | Status: DC
  Administered 2022-08-30 – 2022-09-03 (×5): 1000 mg via ORAL
  Filled 2022-08-29 (×5): qty 2

## 2022-08-29 MED ORDER — DIPHENHYDRAMINE HCL 25 MG PO CAPS: 50.0000 mg | ORAL_CAPSULE | Freq: Three times a day (TID) | ORAL | Status: DC | PRN

## 2022-08-29 MED ORDER — INSULIN ASPART 100 UNIT/ML IJ SOLN
0.0000 [IU] | Freq: Every day | INTRAMUSCULAR | Status: DC
  Administered 2022-08-29: 4 [IU] via SUBCUTANEOUS
  Administered 2022-08-30: 3 [IU] via SUBCUTANEOUS
  Filled 2022-08-29 (×2): qty 1

## 2022-08-29 MED ORDER — METOPROLOL TARTRATE 25 MG PO TABS
12.5000 mg | ORAL_TABLET | Freq: Two times a day (BID) | ORAL | Status: DC
  Administered 2022-08-29: 12.5 mg via ORAL
  Filled 2022-08-29: qty 1

## 2022-08-29 MED ORDER — HALOPERIDOL LACTATE 5 MG/ML IJ SOLN: 5.0000 mg | Freq: Three times a day (TID) | INTRAMUSCULAR | Status: DC | PRN

## 2022-08-29 MED ORDER — LIDOCAINE-EPINEPHRINE-TETRACAINE (LET) TOPICAL GEL
3.0000 mL | Freq: Once | TOPICAL | Status: AC
  Administered 2022-08-29: 3 mL via TOPICAL
  Filled 2022-08-29: qty 3

## 2022-08-29 MED ORDER — METFORMIN HCL 500 MG PO TABS: 1000.0000 mg | ORAL_TABLET | Freq: Every day | ORAL | Status: DC

## 2022-08-29 MED ORDER — INSULIN ASPART 100 UNIT/ML IJ SOLN
0.0000 [IU] | Freq: Three times a day (TID) | INTRAMUSCULAR | Status: DC
  Administered 2022-08-29: 5 [IU] via SUBCUTANEOUS
  Administered 2022-08-30: 3 [IU] via SUBCUTANEOUS
  Administered 2022-08-30: 8 [IU] via SUBCUTANEOUS
  Administered 2022-08-30: 5 [IU] via SUBCUTANEOUS
  Administered 2022-08-31: 3 [IU] via SUBCUTANEOUS
  Administered 2022-08-31: 5 [IU] via SUBCUTANEOUS
  Administered 2022-09-01 – 2022-09-02 (×2): 2 [IU] via SUBCUTANEOUS
  Filled 2022-08-29 (×7): qty 1

## 2022-08-29 MED ORDER — METOPROLOL TARTRATE 25 MG PO TABS
12.5000 mg | ORAL_TABLET | Freq: Two times a day (BID) | ORAL | Status: DC
  Administered 2022-08-29 – 2022-09-03 (×10): 12.5 mg via ORAL
  Filled 2022-08-29 (×10): qty 1

## 2022-08-29 MED ORDER — ACETAMINOPHEN 325 MG PO TABS
650.0000 mg | ORAL_TABLET | Freq: Four times a day (QID) | ORAL | Status: DC | PRN
  Administered 2022-08-30: 650 mg via ORAL
  Filled 2022-08-29 (×2): qty 2

## 2022-08-29 MED ORDER — MAGNESIUM HYDROXIDE 400 MG/5ML PO SUSP: 30.0000 mL | Freq: Every day | ORAL | Status: DC | PRN

## 2022-08-29 MED ORDER — INSULIN GLARGINE-YFGN 100 UNIT/ML ~~LOC~~ SOLN
19.0000 [IU] | Freq: Every day | SUBCUTANEOUS | Status: DC
  Administered 2022-08-29: 19 [IU] via SUBCUTANEOUS
  Filled 2022-08-29: qty 0.19

## 2022-08-29 MED ORDER — LORAZEPAM 2 MG PO TABS: 2.0000 mg | ORAL_TABLET | Freq: Three times a day (TID) | ORAL | Status: DC | PRN

## 2022-08-29 MED ORDER — INSULIN GLARGINE-YFGN 100 UNIT/ML ~~LOC~~ SOLN
40.0000 [IU] | Freq: Every day | SUBCUTANEOUS | Status: DC
  Administered 2022-08-29 – 2022-09-02 (×5): 40 [IU] via SUBCUTANEOUS
  Filled 2022-08-29 (×7): qty 0.4

## 2022-08-29 MED ORDER — TETANUS-DIPHTH-ACELL PERTUSSIS 5-2.5-18.5 LF-MCG/0.5 IM SUSY
0.5000 mL | PREFILLED_SYRINGE | Freq: Once | INTRAMUSCULAR | Status: AC
  Administered 2022-08-29: 0.5 mL via INTRAMUSCULAR
  Filled 2022-08-29: qty 0.5

## 2022-08-29 MED ORDER — DIPHENHYDRAMINE HCL 50 MG/ML IJ SOLN: 50.0000 mg | Freq: Three times a day (TID) | INTRAMUSCULAR | Status: DC | PRN

## 2022-08-29 MED ORDER — SODIUM CHLORIDE 0.9 % IV BOLUS
1000.0000 mL | Freq: Once | INTRAVENOUS | Status: AC
  Administered 2022-08-29: 1000 mL via INTRAVENOUS

## 2022-08-29 MED ORDER — FLUOXETINE HCL 20 MG PO CAPS
20.0000 mg | ORAL_CAPSULE | Freq: Every day | ORAL | Status: DC
  Administered 2022-08-29 – 2022-09-03 (×6): 20 mg via ORAL
  Filled 2022-08-29 (×6): qty 1

## 2022-08-29 MED ORDER — ATORVASTATIN CALCIUM 20 MG PO TABS
20.0000 mg | ORAL_TABLET | Freq: Every day | ORAL | Status: DC
  Administered 2022-08-29: 20 mg via ORAL
  Filled 2022-08-29: qty 1

## 2022-08-29 NOTE — Tx Team (Signed)
Initial Treatment Plan 08/29/2022 3:55 PM [name] ZOX:[id number]    PATIENT STRESSORS: Financial difficulties     PATIENT STRENGTHS: Ability for insight  Average or above average intelligence  Capable of independent living  Motivation for treatment/growth  Physical Health  Supportive family/friends  Work skills    PATIENT IDENTIFIED PROBLEMS: Loss of job  finicial                   DISCHARGE CRITERIA:  Ability to meet basic life and health needs Adequate post-discharge living arrangements Improved stabilization in mood, thinking, and/or behavior Need for constant or close observation no longer present Safe-care adequate arrangements made Verbal commitment to aftercare and medication compliance  PRELIMINARY DISCHARGE PLAN: Outpatient therapy Return to previous living arrangement  PATIENT/FAMILY INVOLVEMENT: This treatment plan has been presented to and reviewed with the patient, Dylan Watts,  The patient and family have been given the opportunity to ask questions and make suggestions.  Bonnita Hollow, RN 08/29/2022, 3:55 PM

## 2022-08-29 NOTE — Group Note (Signed)
[facility] LCSW Group Therapy Note   Group Date: 08/29/2022 Start Time: 1310 End Time: 1420   Type of Therapy/Topic:  Group Therapy:  Emotion Regulation  Participation Level:  Minimal   Mood:  Description of Group:    The purpose of this group is to assist patients in learning to regulate negative emotions and experience positive emotions. Patients will be guided to discuss ways in which they have been vulnerable to their negative emotions. These vulnerabilities will be juxtaposed with experiences of positive emotions or situations, and patients challenged to use positive emotions to combat negative ones. Special emphasis will be placed on coping with negative emotions in conflict situations, and patients will process healthy conflict resolution skills.  Therapeutic Goals: Patient will identify two positive emotions or experiences to reflect on in order to balance out negative emotions:  Patient will label two or more emotions that they find the most difficult to experience:  Patient will be able to demonstrate positive conflict resolution skills through discussion or role plays:   Summary of Patient Progress: Patient was present for the entirety of the group process. He participated in the icebreaker but not much in the larger group discussion. Despite this he appeared to be attending to the conversation and engaged. Pt appeared open and receptive to feedback/comments from both peers and facilitator.    Therapeutic Modalities:   Cognitive Behavioral Therapy Feelings Identification Dialectical Behavioral Therapy   Glenis Smoker, LCSW

## 2022-08-29 NOTE — BH Assessment (Signed)
Patient is to be admitted to [facility] by Dr. Toni Amend.  Attending Physician will be Dr.  Toni Amend .   Patient has been assigned to room 303, by [facility] Charge Nurse Johnny Bridge.    ER staff is aware of the admission: Rachael, ER Secretary   Dr. Roxan Hockey, ER MD  Pattricia Boss, Patient's Nurse

## 2022-08-29 NOTE — BH Assessment (Signed)
Comprehensive Clinical Assessment (CCA) Note  08/29/2022 Dylan Watts [id number]  [name], 32 year old male who presents to [facility] ED involuntarily for treatment. Per triage note, Pt arrived via POV with mother, reports prior to arrival he used a kitchen knife and made 2 lacerations to L wrist in attempt of self-harm.  Pt tearful in triage, states he has never had any previous attempt to harm himself, states he has hx of depression but is "not diagnosed" not currently on medications.  + etoh use, last drank yesterday, denies any drug use. Pt denies any A/V hallucinations. Bleeding controlled at this time. Reports last tetanus shot was about 2 years ago. Pt reports he lives with a roommate who called his mother after he cut himself.   During TTS assessment pt presents alert and oriented x 4, restless but cooperative, and mood-congruent with affect. The pt does not appear to be responding to internal or external stimuli. Neither is the pt presenting with any delusional thinking. Pt verified the information provided to triage RN.   Pt identifies his main complaint to be that he has been depressed for several years but recently his symptoms have worsened in the past month. Patient states he does not take any medications and is not being followed by a provider. Patient reports he had an "episode" last night which resulted in a panic attack and patient states he cut himself with a kitchen knife. Patient reports he lives with his roommate who called his mother. Patient states he has not worked since the beginning of May, yet he is actively seeking employment. Patient reports he simply does not care anymore. Patient says he stopped taking his BP meds about 5-6 months ago and he is constantly having mood swings. "I am ok one minute and then the next I am upset." Pt denies using any illicit substances but drinks 1-4 beers in a week. Patient reports he has not been sleeping well, getting 3-4 hrs of sleep each night.  Patient reports a "sometimey" appetite. "Sometimes, yes and sometimes, no." Patient reports having no prior suicidal attempts.  Pt denies INPT and OPT hx. Pt denies current SI/HI/AH/VH. Pt is unable to contract for safety and believes inpatient hospitalization would be beneficial.   Per Annice Pih, NP: Patient gave verbal consent to speak with his mother, provides mother's name, [name], and phone number [phone]. [name] reports patient has been "going through some things". Reports patient has had financial issues, lost his job, and lost a romantic relationship. She notes patient has had significant weight loss. She reports earlier this morning she was contacted by patient's friend who sent her a picture of patient's slit wrist. She states she lives in [place] and went to [place] to bring him to the hospital. She expresses safety concerns about patient going back home. She feels that he will "sink back into the same routine if he goes back there".   Per Annice Pih, NP, pt is recommended for inpatient psychiatric admission.    Chief Complaint:  Chief Complaint  Patient presents with   Suicidal   Laceration   Visit Diagnosis: Major depressive disorder    CCA Screening, Triage and Referral (STR)  Patient Reported Information How did you hear about [place]? Family/Friend  Referral name: No data recorded Referral phone number: No data recorded  Whom do you see for routine medical problems? No data recorded Practice/Facility Name: No data recorded Practice/Facility Phone Number: No data recorded Name of Contact: No data recorded Contact Number: No data recorded  Contact Fax Number: No data recorded Prescriber Name: No data recorded Prescriber Address (if known): No data recorded  What Is the Reason for Your Visit/Call Today? Patient was brought to the ED for an attempt to end his life by cutting wrist with a kitchen knife.  How Long Has This Been Causing You Problems? > than 6 months  What  Do You Feel Would Help You the Most Today? Treatment for Depression or other mood problem; Stress Management; Medication(s)   Have You Recently Been in Any Inpatient Treatment (Hospital/Detox/Crisis Center/28-Day Program)? No data recorded Name/Location of Program/Hospital:No data recorded How Long Were You There? No data recorded When Were You Discharged? No data recorded  Have You Ever Received Services From [facility] Before? No data recorded Who Do You See at [facility]? No data recorded  Have You Recently Had Any Thoughts About Hurting Yourself? Yes  Are You Planning to Commit Suicide/Harm Yourself At This time? No   Have you Recently Had Thoughts About Hurting Someone Karolee Ohs? No  Explanation: No data recorded  Have You Used Any Alcohol or Drugs in the Past 24 Hours? Yes  How Long Ago Did You Use Drugs or Alcohol? No data recorded What Did You Use and How Much? Alcohol   Do You Currently Have a Therapist/Psychiatrist? No  Name of Therapist/Psychiatrist: No data recorded  Have You Been Recently Discharged From Any Office Practice or Programs? No  Explanation of Discharge From Practice/Program: No data recorded    CCA Screening Triage Referral Assessment Type of Contact: Face-to-Face  Is this Initial or Reassessment? No data recorded Date Telepsych consult ordered in [facility]:  No data recorded Time Telepsych consult ordered in [facility]:  No data recorded  Patient Reported Information Reviewed? No data recorded Patient Left Without Being Seen? No data recorded Reason for Not Completing Assessment: No data recorded  Collateral Involvement: None provided   Does Patient Have a Court Appointed Legal Guardian? No data recorded Name and Contact of Legal Guardian: No data recorded If Minor and Not Living with Parent(s), Who has Custody? No data recorded Is CPS involved or ever been involved? Never  Is APS involved or ever been involved? Never   Patient Determined To Be At  Risk for Harm To Self or Others Based on Review of Patient Reported Information or Presenting Complaint? Yes, for Self-Harm  Method: No Plan  Availability of Means: No access or NA  Intent: Vague intent or NA  Notification Required: No need or identified person  Additional Information for Danger to Others Potential: No data recorded Additional Comments for Danger to Others Potential: No data recorded Are There Guns or Other Weapons in Your Home? No data recorded Types of Guns/Weapons: No data recorded Are These Weapons Safely Secured?                            No data recorded Who Could Verify You Are Able To Have These Secured: No data recorded Do You Have any Outstanding Charges, Pending Court Dates, Parole/Probation? No data recorded Contacted To Inform of Risk of Harm To Self or Others: No data recorded  Location of Assessment: [facility] ED   Does Patient Present under Involuntary Commitment? No  IVC Papers Initial File Date: No data recorded  [place] of Residence: [place]   Patient Currently Receiving the Following Services: Not Receiving Services   Determination of Need: Emergent (2 hours)   Options For Referral: ED Visit; Inpatient  Hospitalization; Medication Management; Outpatient Therapy     CCA Biopsychosocial Intake/Chief Complaint:  No data recorded Current Symptoms/Problems: No data recorded  Patient Reported Schizophrenia/Schizoaffective Diagnosis in Past: No   Strengths: Able to communicate and verbalize needs.  Preferences: No data recorded Abilities: No data recorded  Type of Services Patient Feels are Needed: No data recorded  Initial Clinical Notes/Concerns: No data recorded  Mental Health Symptoms Depression:   Change in energy/activity; Difficulty Concentrating; Increase/decrease in appetite; Irritability   Duration of Depressive symptoms:  Greater than two weeks   Mania:   N/A   Anxiety:    Difficulty concentrating; Irritability;  Sleep   Psychosis:   None   Duration of Psychotic symptoms: No data recorded  Trauma:   N/A   Obsessions:   N/A   Compulsions:   N/A   Inattention:   N/A   Hyperactivity/Impulsivity:   N/A   Oppositional/Defiant Behaviors:   N/A   Emotional Irregularity:   Intense/inappropriate anger; Potentially harmful impulsivity   Other Mood/Personality Symptoms:  No data recorded   Mental Status Exam Appearance and self-care  Stature:   Average   Weight:   Average weight   Clothing:   Casual   Grooming:   Normal   Cosmetic use:   None   Posture/gait:   Slumped   Motor activity:   Not Remarkable   Sensorium  Attention:   Normal   Concentration:   Anxiety interferes   Orientation:   X5   Recall/memory:   Normal   Affect and Mood  Affect:   Depressed; Flat   Mood:   Depressed; Anxious   Relating  Eye contact:   Normal   Facial expression:   Depressed; Sad   Attitude toward examiner:   Cooperative   Thought and Language  Speech flow:  Clear and Coherent   Thought content:   Appropriate to Mood and Circumstances   Preoccupation:   None   Hallucinations:   None   Organization:  No data recorded  [employer] of Knowledge:   Average   Intelligence:   Average   Abstraction:   Functional   Judgement:   Impaired   Reality Testing:   Realistic   Insight:   Fair   Decision Making:   Impulsive   Social Functioning  Social Maturity:   Irresponsible   Social Judgement:   Victimized; "Street Smart"   Stress  Stressors:   Work; Transitions; Illness   Coping Ability:   Exhausted   Skill Deficits:   Decision making; Communication   Supports:   Family     Religion:    Leisure/Recreation:    Exercise/Diet: Exercise/Diet Do You Have Any Trouble Sleeping?: Yes Explanation of Sleeping Difficulties: Patient reports he is only sleeping 3-4 hours per night.   CCA  Employment/Education Employment/Work Situation: Employment / Work Systems developer: Unemployed  Education:     CCA Family/Childhood History Family and Relationship History: Family history Marital status: Single Does patient have children?: No  Childhood History:     Child/Adolescent Assessment:     CCA Substance Use Alcohol/Drug Use: Alcohol / Drug Use Pain Medications: See PTA Prescriptions: See PTA Over the Counter: See PTA History of alcohol / drug use?: Yes Longest period of sobriety (when/how long): Alcohol Substance #1 Name of Substance 1: Alcohol 1 - Frequency: 2 times per weekly 1 - Duration: weekly 1 - Last Use / Amount: 1-4 beers 1- Route of Use: oral  ASAM's:  Six Dimensions of Multidimensional Assessment  Dimension 1:  Acute Intoxication and/or Withdrawal Potential:      Dimension 2:  Biomedical Conditions and Complications:      Dimension 3:  Emotional, Behavioral, or Cognitive Conditions and Complications:     Dimension 4:  Readiness to Change:     Dimension 5:  Relapse, Continued use, or Continued Problem Potential:     Dimension 6:  Recovery/Living Environment:     ASAM Severity Score:    ASAM Recommended Level of Treatment:     Substance use Disorder (SUD) Substance Use Disorder (SUD)  Checklist Symptoms of Substance Use: Continued use despite having a persistent/recurrent physical/psychological problem caused/exacerbated by use, Continued use despite persistent or recurrent social, interpersonal problems, caused or exacerbated by use, Evidence of tolerance  Recommendations for Services/Supports/Treatments: Recommendations for Services/Supports/Treatments Recommendations For Services/Supports/Treatments: Medication Management, SAIOP (Substance Abuse Intensive Outpatient Program)  DSM5 Diagnoses: Patient Active Problem List   Diagnosis Date Noted   Major depressive disorder 08/29/2022   DKA  (diabetic ketoacidoses) 04/30/2019   Hyperlipidemia 04/30/2019   Nausea & vomiting 04/30/2019   Hypertension 04/30/2019   Obesity, Class III, BMI 40-49.9 (morbid obesity) (HCC) 09/08/2017   Leukocytosis 09/08/2017   AKI (acute kidney injury) (HCC) 09/08/2017   Hypokalemia 09/08/2017   Hypophosphatemia 09/08/2017   Diabetic ketoacidosis (HCC) 09/07/2017    Patient Centered Plan: Patient is on the following Treatment Plan(s):  Anxiety and Depression   Referrals to Alternative Service(s): Referred to Alternative Service(s):   Place:   Date:   Time:    Referred to Alternative Service(s):   Place:   Date:   Time:    Referred to Alternative Service(s):   Place:   Date:   Time:    Referred to Alternative Service(s):   Place:   Date:   Time:      @BHCOLLABOFCARE @  Keona Bilyeu R Theatre manager, Counselor, LCAS-A

## 2022-08-29 NOTE — Progress Notes (Deleted)
Pt reports fair sleep last night, he stated sleep medication helped.Today, pt reports depression is 8/10,anxiety 8/10 ,hopelessness 8/10. Pt contracts verbally for safety and is compliant with staff requests. Pt is compliant with medications,and attended groups today.

## 2022-08-29 NOTE — ED Notes (Signed)
Report to annie, rn.  

## 2022-08-29 NOTE — Inpatient Diabetes Management (Addendum)
Inpatient Diabetes Program Recommendations  AACE/ADA: New Consensus Statement on Inpatient Glycemic Control   Target Ranges:  Prepandial:   less than 140 mg/dL      Peak postprandial:   less than 180 mg/dL (1-2 hours)      Critically ill patients:  140 - 180 mg/dL    Latest Reference Range & Units 08/29/22 04:39 08/29/22 09:30  CO2 22 - 32 mmol/L 17 (L) 18 (L)  Glucose 70 - 99 mg/dL 098 (H) 119 (H)  BUN 6 - 20 mg/dL 10 7  Creatinine 1.47 - 1.24 mg/dL 8.29 5.62  Calcium 8.9 - 10.3 mg/dL 9.3 8.6 (L)  Anion gap 5 - 15  16 (H) 14    Latest Reference Range & Units 08/29/22 04:39  Beta-Hydroxybutyric Acid 0.05 - 0.27 mmol/L 0.39 (H)   Review of Glycemic Control  Diabetes history: DM2 Outpatient Diabetes medications: Metformin 1000 mg QAM Current orders for Inpatient glycemic control: Metformin 1000 mg QAM  Inpatient Diabetes Program Recommendations:    Insulin: Please consider ordering Semglee 19 units daily (to start now; based on 128.5 kg x 0.15 units), CBGs AC&HS, and Novolog 0-15 units TID with meals and Novolog 0-5 units QHS.  NOTE: Patient currently in ED for attempted suicide and under IVC. Lab glucose 397 mg/dl on 04/17/84 at 5:78 am today. Patient given one time Novolog 10 units at 7:45 am and lab glucose 270 mg/dl at 4:69 am.  In reviewing chart, patient has been on insulin outpatient in the past. Patient was inpatient 04/30/19-05/04/19 and was discharged on Lantus 48 units QHS, Novolog 0-20 units TID with meals, and Metformin 1000 mg daily at that time.   Thanks, Orlando Penner, RN, MSN, CDCES Diabetes Coordinator Inpatient Diabetes Program [phone] (Team Pager from 8am to 5pm)

## 2022-08-29 NOTE — Plan of Care (Signed)
Patient stayed in the day room and was calm and cooperative. Alert and oriented x 4. Reported that he was drinking heavily secondary to depression. Currently denying SI/HI but still endorsing sadness and helplessness. Reported feeling safe on the unit. Received insulin per sliding scale. Encouragements and support provided. Safety monitored per unit protocol.

## 2022-08-29 NOTE — H&P (Signed)
Psychiatric Admission Assessment Adult  Patient Identification: Dylan Watts MRN:  [id number] Date of Evaluation:  08/29/2022 Chief Complaint:  MDD (major depressive disorder) [F32.9] Principal Diagnosis: MDD (major depressive disorder) Diagnosis:  Principal Problem:   MDD (major depressive disorder) Active Problems:   Diabetes mellitus (HCC)   Self-inflicted laceration of left wrist (HCC)  History of Present Illness: Patient seen and chart reviewed.  32 year old man with a history of depression and anxiety presented to the emergency room after cutting himself on the left wrist.  Patient reports that he has been in a depressed mood for months now.  Multiple stresses.  Lost his job currently out of work.  Girlfriend broke up with him.  Feels helpless much of the time.  Has panic attacks a couple times a week.  Not currently on any medication or seeing a therapist.  Patient said he was drinking last night and started to have a panic attack.  He tried to drink some water but dropped the water on the floor which made him more upset.  He then grabbed a knife and cut himself.  He denies having been conscious of wanting to kill himself leading up to that and he was cooperative with coming to the hospital.  Denies other drug abuse.  Minimizes alcohol use although clearly it was a factor in this presentation.  Patient also has diabetes and is supposed to be on insulin but has been neglecting his health and not controlling his diabetes at all lately. Associated Signs/Symptoms: Depression Symptoms:  depressed mood, anhedonia, psychomotor retardation, fatigue, suicidal attempt, (Hypo) Manic Symptoms:  Impulsivity, Anxiety Symptoms:  Panic Symptoms, Psychotic Symptoms:   None PTSD Symptoms: Negative Total Time spent with patient: 45 minutes  Past Psychiatric History: Past history of treatment with Effexor from primary care doctor although the patient did not seem to remember it.  No prior  hospitalization.  Denies prior suicide attempts.  Therapy as a child but nothing since then.  Is the patient at risk to self? Yes.    Has the patient been a risk to self in the past 6 months? No.  Has the patient been a risk to self within the distant past? No.  Is the patient a risk to others? No.  Has the patient been a risk to others in the past 6 months? No.  Has the patient been a risk to others within the distant past? No.   [place] Scale:  Flowsheet Row Admission (Current) from 08/29/2022 in [facility] INPATIENT BEHAV[facility]AL MEDICINE Most recent reading at 08/29/2022  1:00 PM ED from 08/29/2022 in [facility] Emergency Department at [facility] Most recent reading at 08/29/2022  4:35 AM  C-SSRS RISK CATEGORY Error: Q7 should not be populated when Q6 is No High Risk        Prior Inpatient Therapy: No. If yes, describe none Prior Outpatient Therapy: Yes.   If yes, describe briefly through primary care  Alcohol Screening: 1. How often do you have a drink containing alcohol?: 4 or more times a week 2. How many drinks containing alcohol do you have on a typical day when you are drinking?: 1 or 2 3. How often do you have six or more drinks on one occasion?: Never AUDIT-C Score: 4 4. How often during the last year have you found that you were not able to stop drinking once you had started?: Never 5. How often during the last year have you failed to do what was normally expected from you because of  drinking?: Never 6. How often during the last year have you needed a first drink in the morning to get yourself going after a heavy drinking session?: Never 7. How often during the last year have you had a feeling of guilt of remorse after drinking?: Never 8. How often during the last year have you been unable to remember what happened the night before because you had been drinking?: Never 9. Have you or someone else been injured as a result of your drinking?: No 10. Has a relative or friend or a  doctor or another health worker been concerned about your drinking or suggested you cut down?: No Alcohol Use Disorder Identification Test Final Score (AUDIT): 4 Alcohol Brief Interventions/Follow-up: Alcohol education/Brief advice Substance Abuse History in the last 12 months:  Yes.   Consequences of Substance Abuse: Seems clear that alcohol abuse played a role in this presentation Previous Psychotropic Medications: Yes  Psychological Evaluations: Yes  Past Medical History:  Past Medical History:  Diagnosis Date   Diabetes mellitus without complication (HCC)    DKA (diabetic ketoacidoses) 04/2019    Past Surgical History:  Procedure Laterality Date   NO PAST SURGERIES     Family History:  Family History  Problem Relation Age of Onset   Diabetes Mother    Diabetes Father    Lupus Father    Diabetes Maternal Grandmother    Hypertension Maternal Grandmother    Diabetes Paternal Grandmother    Stroke Paternal Grandmother    Hypertension Paternal Grandmother    Family Psychiatric  History: None reported Tobacco Screening:  Social History   Tobacco Use  Smoking Status Never  Smokeless Tobacco Never    BH Tobacco Counseling     Are you interested in Tobacco Cessation Medications?  No value filed. Counseled patient on smoking cessation:  No value filed. Reason Tobacco Screening Not Completed: No value filed.       Social History:  Social History   Substance and Sexual Activity  Alcohol Use Yes   Comment: socially     Social History   Substance and Sexual Activity  Drug Use No    Additional Social History:                           Allergies:  No Known Allergies Lab Results:  Results for orders placed or performed during the hospital encounter of 08/29/22 (from the past 48 hour(s))  Glucose, capillary     Status: Abnormal   Collection Time: 08/29/22  4:07 PM  Result Value Ref Range   Glucose-Capillary 232 (H) 70 - 99 mg/dL    Comment: Glucose  reference range applies only to samples taken after fasting for at least 8 hours.    Blood Alcohol level:  Lab Results  Component Value Date   ETH 129 (H) 08/29/2022    Metabolic Disorder Labs:  Lab Results  Component Value Date   HGBA1C 13.1 (H) 08/29/2022   MPG 329.27 08/29/2022   MPG 380.93 05/01/2019   No results found for: "PROLACTIN" Lab Results  Component Value Date   CHOL 140 01/22/2019   TRIG 84 01/22/2019   HDL 33 (L) 01/22/2019   CHOLHDL 4.2 01/22/2019   LDLCALC 91 01/22/2019    Current Medications: Current Facility-Administered Medications  Medication Dose Route Frequency Provider Last Rate Last Admin   acetaminophen (TYLENOL) tablet 650 mg  650 mg Oral Q6H PRN Lauree Chandler, NP  alum & mag hydroxide-simeth (MAALOX/MYLANTA) 200-200-20 MG/5ML suspension 30 mL  30 mL Oral Q4H PRN Lauree Chandler, NP       [START ON 08/30/2022] atorvastatin (LIPITOR) tablet 20 mg  20 mg Oral Daily Lauree Chandler, NP       diphenhydrAMINE (BENADRYL) capsule 50 mg  50 mg Oral TID PRN Lauree Chandler, NP       Or   diphenhydrAMINE (BENADRYL) injection 50 mg  50 mg Intramuscular TID PRN Lauree Chandler, NP       FLUoxetine (PROZAC) capsule 20 mg  20 mg Oral Daily Darene Nappi, Jackquline Denmark, MD   20 mg at 08/29/22 1622   haloperidol (HALDOL) tablet 5 mg  5 mg Oral TID PRN Lauree Chandler, NP       Or   haloperidol lactate (HALDOL) injection 5 mg  5 mg Intramuscular TID PRN Lauree Chandler, NP       hydrOXYzine (ATARAX) tablet 25 mg  25 mg Oral TID PRN Lauree Chandler, NP       insulin aspart (novoLOG) injection 0-15 Units  0-15 Units Subcutaneous TID WC Lauree Chandler, NP   5 Units at 08/29/22 1621   insulin aspart (novoLOG) injection 0-5 Units  0-5 Units Subcutaneous QHS Lauree Chandler, NP       [START ON 08/30/2022] insulin aspart (novoLOG) injection 7 Units  7 Units Subcutaneous TID WC Leilan Bochenek, Jackquline Denmark, MD       insulin glargine-yfgn (SEMGLEE)  injection 40 Units  40 Units Subcutaneous QHS Sherah Lund T, MD       LORazepam (ATIVAN) tablet 2 mg  2 mg Oral TID PRN Lauree Chandler, NP       Or   LORazepam (ATIVAN) injection 2 mg  2 mg Intramuscular TID PRN Lauree Chandler, NP       magnesium hydroxide (MILK OF MAGNESIA) suspension 30 mL  30 mL Oral Daily PRN Lauree Chandler, NP       [START ON 08/30/2022] metFORMIN (GLUCOPHAGE) tablet 1,000 mg  1,000 mg Oral Q breakfast Lauree Chandler, NP       metoprolol tartrate (LOPRESSOR) tablet 12.5 mg  12.5 mg Oral BID Lauree Chandler, NP   12.5 mg at 08/29/22 1620   traZODone (DESYREL) tablet 50 mg  50 mg Oral QHS PRN Lauree Chandler, NP       PTA Medications: Medications Prior to Admission  Medication Sig Dispense Refill Last Dose   atorvastatin (LIPITOR) 20 MG tablet Take 1 tablet (20 mg total) by mouth daily. 30 tablet 2    metFORMIN (GLUCOPHAGE) 1000 MG tablet Take 1 tab po every morning for 1 week. After 1 week, may increase to 1 tab po BID. 60 tablet 2    metoprolol tartrate (LOPRESSOR) 25 MG tablet Take 0.5 tablets (12.5 mg total) by mouth 2 (two) times daily. 60 tablet 3    Multiple Vitamins-Minerals (MULTIVITAMIN WITH MINERALS) tablet Take 1 tablet by mouth daily.      venlafaxine XR (EFFEXOR-XR) 75 MG 24 hr capsule Take 1 capsule (75 mg total) by mouth daily with breakfast. To help with anxiety 30 capsule 3    vitamin E (VITAMIN E) 180 MG (400 UNITS) capsule Take 400 Units by mouth daily.       Musculoskeletal: Strength & Muscle Tone: within normal limits Gait & Station: normal Patient leans: N/A            Psychiatric Specialty Exam:  Presentation  General Appearance:  Appropriate for Environment; Other (comment) (dressed in hospital scrubs)  Eye Contact: Fair  Speech: Clear and Coherent; Slow  Speech Volume: Decreased  Handedness: Right   Mood and Affect  Mood: Depressed; Dysphoric  Affect: Flat; Tearful   Thought  Process  Thought Processes: Coherent; Goal Directed; Linear  Duration of Psychotic Symptoms:N/A Past Diagnosis of Schizophrenia or Psychoactive disorder: No  Descriptions of Associations:Intact  Orientation:Full (Time, Place and Person)  Thought Content:Logical  Hallucinations:Hallucinations: None  Ideas of Reference:None  Suicidal Thoughts:Suicidal Thoughts: Yes, Passive  Homicidal Thoughts:Homicidal Thoughts: No   Sensorium  Memory: Immediate Good; Recent Good; Remote Good  Judgment: Intact  Insight: Fair   Art therapist  Concentration: Fair  Attention Span: Fair  Recall: [employer] of Knowledge: Fair  Language: Fair   Psychomotor Activity  Psychomotor Activity: Psychomotor Activity: Normal   Assets  Assets: Manufacturing systems engineer; Desire for Improvement; Housing; Resilience; Social Support   Sleep  Sleep: Sleep: Poor Number of Hours of Sleep: 0 (3 to 4 hours/night)    Physical Exam: Physical Exam Vitals and nursing note reviewed.  Constitutional:      Appearance: Normal appearance.  HENT:     Head: Normocephalic and atraumatic.     Mouth/Throat:     Pharynx: Oropharynx is clear.  Eyes:     Pupils: Pupils are equal, round, and reactive to light.  Cardiovascular:     Rate and Rhythm: Normal rate and regular rhythm.  Pulmonary:     Effort: Pulmonary effort is normal.     Breath sounds: Normal breath sounds.  Abdominal:     General: Abdomen is flat.     Palpations: Abdomen is soft.  Musculoskeletal:        General: Normal range of motion.  Skin:    General: Skin is warm and dry.  Neurological:     General: No focal deficit present.     Mental Status: He is alert. Mental status is at baseline.  Psychiatric:        Attention and Perception: Attention normal.        Mood and Affect: Mood is anxious and depressed.        Speech: Speech is delayed.        Behavior: Behavior is slowed.        Thought Content: Thought  content normal.    Review of Systems  Constitutional: Negative.   HENT: Negative.    Eyes: Negative.   Respiratory: Negative.    Cardiovascular: Negative.   Gastrointestinal: Negative.   Musculoskeletal: Negative.   Skin: Negative.   Neurological: Negative.   Psychiatric/Behavioral:  Positive for depression. The patient is nervous/anxious.    Blood pressure 133/86, pulse 98, temperature 98.9 F (37.2 C), temperature source Oral, resp. rate 20, height 6\' 1"  (1.854 m), weight (!) 138.3 kg, SpO2 98 %. Body mass index is 40.24 kg/m.  Treatment Plan Summary: Medication management and Plan first of all with the diabetes.  Clearly out of control.  Hemoglobin A1c 13.  Patient says that he used to take long-acting insulin 40 units at night and short acting 10 units with each meal.  We will restart something like that but with a lower dosage.  Also sliding scale.  Suggested patient trial of fluoxetine for anxiety and depression.  Side effects reviewed.  Patient agrees to plan.  Ongoing assessment of dangerousness prior to any discharge planning.  Observation Level/Precautions:  15 minute checks  Laboratory:  Chemistry Profile  Psychotherapy:    Medications:    Consultations:    Discharge Concerns:    Estimated LOS:  Other:     Physician Treatment Plan for Primary Diagnosis: MDD (major depressive disorder) Long Term Goal(s): Improvement in symptoms so as ready for discharge  Short Term Goals: Ability to verbalize feelings will improve and Ability to disclose and discuss suicidal ideas  Physician Treatment Plan for Secondary Diagnosis: Principal Problem:   MDD (major depressive disorder) Active Problems:   Diabetes mellitus (HCC)   Self-inflicted laceration of left wrist (HCC)  Long Term Goal(s): Improvement in symptoms so as ready for discharge  Short Term Goals: Ability to maintain clinical measurements within normal limits will improve and Compliance with prescribed medications  will improve  I certify that inpatient services furnished can reasonably be expected to improve the patient's condition.    Mordecai Rasmussen, MD 6/12/20244:25 PM

## 2022-08-29 NOTE — ED Notes (Signed)
Pt given breakfast tray, ok to eat per Dr Roxan Hockey, repeat labs after bolus

## 2022-08-29 NOTE — Group Note (Signed)
Date:  08/29/2022 Time:  6:37 PM  Group Topic/Focus:  Activity Group    Participation Level:  Active  Participation Quality:  Appropriate  Affect:  Appropriate  Cognitive:  Appropriate  Insight: Appropriate  Engagement in Group:  Engaged  Modes of Intervention:  Activity  Additional Comments:    Mary Sella Greco Gastelum 08/29/2022, 6:37 PM

## 2022-08-29 NOTE — [facility] Suicide Risk Assessment (Signed)
[facility] Admission Suicide Risk Assessment   Nursing information obtained from:  Patient Demographic factors:  Male, Unemployed Current Mental Status:  Suicidal ideation indicated by patient Loss Factors:  Financial problems / change in socioeconomic status Historical Factors:  NA Risk Reduction Factors:  Sense of responsibility to family  Total Time spent with patient: 45 minutes Principal Problem: MDD (major depressive disorder) Diagnosis:  Principal Problem:   MDD (major depressive disorder) Active Problems:   Diabetes mellitus (HCC)   Self-inflicted laceration of left wrist (HCC)  Subjective Data: Patient seen and chart reviewed.  32 year old man with a history of depression and cut himself on the left wrist yesterday.  He had been drinking excessively and feeling stressed out and upset about his multiple problems.  He said he started to have a panic attack and was trying to get a drink of water.  He dropped his water on the floor which made the panic attack even worse.  At that point he impulsively grabbed a knife and cut himself.  Patient denies having been conscious of suicidal thoughts leading up to it but admits to chronic depression low mood hopelessness and intermittent panic attack symptoms  Continued Clinical Symptoms:  Alcohol Use Disorder Identification Test Final Score (AUDIT): 4 The "Alcohol Use Disorders Identification Test", Guidelines for Use in Primary Care, Second Edition.  World Science writer [facility]). Score between 0-7:  no or low risk or alcohol related problems. Score between 8-15:  moderate risk of alcohol related problems. Score between 16-19:  high risk of alcohol related problems. Score 20 or above:  warrants further diagnostic evaluation for alcohol dependence and treatment.   CLINICAL FACTORS:   Panic Attacks Depression:   Comorbid alcohol abuse/dependence   Musculoskeletal: Strength & Muscle Tone: within normal limits Gait & Station: normal Patient  leans: N/A  Psychiatric Specialty Exam:  Presentation  General Appearance:  Appropriate for Environment; Other (comment) (dressed in hospital scrubs)  Eye Contact: Fair  Speech: Clear and Coherent; Slow  Speech Volume: Decreased  Handedness: Right   Mood and Affect  Mood: Depressed; Dysphoric  Affect: Flat; Tearful   Thought Process  Thought Processes: Coherent; Goal Directed; Linear  Descriptions of Associations:Intact  Orientation:Full (Time, Place and Person)  Thought Content:Logical  History of Schizophrenia/Schizoaffective disorder:No  Duration of Psychotic Symptoms:No data recorded Hallucinations:Hallucinations: None  Ideas of Reference:None  Suicidal Thoughts:Suicidal Thoughts: Yes, Passive  Homicidal Thoughts:Homicidal Thoughts: No   Sensorium  Memory: Immediate Good; Recent Good; Remote Good  Judgment: Intact  Insight: Fair   Art therapist  Concentration: Fair  Attention Span: Fair  Recall: [employer] of Knowledge: Fair  Language: Fair   Psychomotor Activity  Psychomotor Activity: Psychomotor Activity: Normal   Assets  Assets: Manufacturing systems engineer; Desire for Improvement; Housing; Resilience; Social Support   Sleep  Sleep: Sleep: Poor Number of Hours of Sleep: 0 (3 to 4 hours/night)    Physical Exam: Physical Exam Vitals and nursing note reviewed.  Constitutional:      Appearance: Normal appearance.  HENT:     Head: Normocephalic and atraumatic.     Mouth/Throat:     Pharynx: Oropharynx is clear.  Eyes:     Pupils: Pupils are equal, round, and reactive to light.  Cardiovascular:     Rate and Rhythm: Normal rate and regular rhythm.  Pulmonary:     Effort: Pulmonary effort is normal.     Breath sounds: Normal breath sounds.  Abdominal:     General: Abdomen is flat.  Palpations: Abdomen is soft.  Musculoskeletal:        General: Normal range of motion.  Skin:    General: Skin is warm  and dry.     Comments: Self-inflicted laceration of the left wrist now bandaged.  Neurological:     General: No focal deficit present.     Mental Status: He is alert. Mental status is at baseline.  Psychiatric:        Attention and Perception: Attention normal.        Mood and Affect: Mood is depressed.        Speech: Speech normal.        Behavior: Behavior normal.        Thought Content: Thought content normal.        Cognition and Memory: Cognition normal.    Review of Systems  Constitutional: Negative.   HENT: Negative.    Eyes: Negative.   Respiratory: Negative.    Cardiovascular: Negative.   Gastrointestinal: Negative.   Musculoskeletal: Negative.   Skin: Negative.   Neurological: Negative.   Psychiatric/Behavioral:  Positive for depression. Negative for hallucinations, substance abuse and suicidal ideas. The patient is nervous/anxious.    Blood pressure 133/86, pulse 98, temperature 98.9 F (37.2 C), temperature source Oral, resp. rate 20, height 6\' 1"  (1.854 m), weight (!) 138.3 kg, SpO2 98 %. Body mass index is 40.24 kg/m.   COGNITIVE FEATURES THAT CONTRIBUTE TO RISK:  Thought constriction (tunnel vision)    SUICIDE RISK:   Mild:  Suicidal ideation of limited frequency, intensity, duration, and specificity.  There are no identifiable plans, no associated intent, mild dysphoria and related symptoms, good self-control (both objective and subjective assessment), few other risk factors, and identifiable protective factors, including available and accessible social support.  PLAN OF CARE: Continue 15-minute checks.  Start antidepressant medicine.  Manage medical problems.  Engage in individual and group therapy.  Ongoing assessment of dangerousness prior to discharge  I certify that inpatient services furnished can reasonably be expected to improve the patient's condition.   Mordecai Rasmussen, MD 08/29/2022, 4:23 PM

## 2022-08-29 NOTE — Plan of Care (Signed)
D- Patient alert and oriented. Affect/mood. Denies  current SI, HI, AVH, and pain. Goals are to work on his mental health and self esteem.  A- Scheduled medications administered to patient, per MD orders. Support and encouragement provided.  Routine safety checks conducted every 15 minutes.  Patient informed to notify staff with problems or concerns.  R- No adverse drug reactions noted. Patient contracts for safety at this time. Patient compliant with medications and treatment plan. Patient receptive, calm, and cooperative. Patient interacts well with others on the unit.  Patient remains safe at this time.

## 2022-08-29 NOTE — ED Notes (Signed)
Pt states he is taking no medications at home, reviewed home med list with pt, pt states he is taking none of those medications.

## 2022-08-29 NOTE — ED Provider Notes (Signed)
[facility] Provider Note    Event Date/Time   First MD Initiated Contact with Patient 08/29/22 0708     (approximate)   History   Suicidal and Laceration   HPI  Dylan Watts is a 32 y.o. male   history diabetes presents to the ER for evaluation of suicidal and self-harm after cutting himself.  Has never had any previous attempts at self-harm states he has a history of depression but this is self-diagnosed.  Not currently taking any medications.  Does have history of alcohol use did drink yesterday.      Physical Exam   Triage Vital Signs: ED Triage Vitals  Enc Vitals Group     BP 08/29/22 0429 121/87     Pulse Rate 08/29/22 0429 (!) 114     Resp 08/29/22 0429 18     Temp 08/29/22 0429 98.5 F (36.9 C)     Temp Source 08/29/22 0429 Oral     SpO2 08/29/22 0429 95 %     Weight 08/29/22 0428 283 lb 4.7 oz (128.5 kg)     Height 08/29/22 0428 6\' 1"  (1.854 m)     Head Circumference --      Peak Flow --      Pain Score 08/29/22 0434 8     Pain Loc --      Pain Edu? --      Excl. in GC? --     Most recent vital signs: Vitals:   08/29/22 0429  BP: 121/87  Pulse: (!) 114  Resp: 18  Temp: 98.5 F (36.9 C)  SpO2: 95%     Constitutional: Alert  Eyes: Conjunctivae are normal.  Head: Atraumatic. Nose: No congestion/rhinnorhea. Mouth/Throat: Mucous membranes are moist.   Neck: Painless ROM.  Cardiovascular:   Good peripheral circulation. Respiratory: Normal respiratory effort.  No retractions.  Gastrointestinal: Soft and nontender.  Musculoskeletal:  no deformity.  Neurovascular intact distally to laceration.  Flexor mechanism intact. Neurologic:  MAE spontaneously. No gross focal neurologic deficits are appreciated.  Skin:  Skin is warm,. No rash noted.  2 well-approximated linear lacerations on the volar left wrist.  Each 1 roughly 3 cm in length. Psychiatric:  Speech and behavior are normal.    ED Results / Procedures / Treatments    Labs (all labs ordered are listed, but only abnormal results are displayed) Labs Reviewed  COMPREHENSIVE METABOLIC PANEL - Abnormal; Notable for the following components:      Result Value   Sodium 133 (*)    CO2 17 (*)    Glucose, Bld 397 (*)    Total Protein 9.4 (*)    Alkaline Phosphatase 158 (*)    Anion gap 16 (*)    All other components within normal limits  ETHANOL - Abnormal; Notable for the following components:   Alcohol, Ethyl (B) 129 (*)    All other components within normal limits  SALICYLATE LEVEL - Abnormal; Notable for the following components:   Salicylate Lvl <7.0 (*)    All other components within normal limits  ACETAMINOPHEN LEVEL - Abnormal; Notable for the following components:   Acetaminophen (Tylenol), Serum <10 (*)    All other components within normal limits  CBC - Abnormal; Notable for the following components:   WBC 11.3 (*)    All other components within normal limits  BLOOD GAS, VENOUS - Abnormal; Notable for the following components:   pCO2, Ven 41 (*)    pO2, Ven 56 (*)  Acid-base deficit 4.1 (*)    All other components within normal limits  BETA-HYDROXYBUTYRIC ACID - Abnormal; Notable for the following components:   Beta-Hydroxybutyric Acid 0.39 (*)    All other components within normal limits  BASIC METABOLIC PANEL - Abnormal; Notable for the following components:   CO2 18 (*)    Glucose, Bld 270 (*)    Calcium 8.6 (*)    All other components within normal limits  URINE DRUG SCREEN, QUALITATIVE ([facility] ONLY)     EKG     RADIOLOGY    PROCEDURES:  Critical Care performed: No  ..Laceration Repair  Date/Time: 08/29/2022 8:14 AM  Performed by: Willy Eddy, MD Authorized by: Willy Eddy, MD   Consent:    Consent obtained:  Verbal Laceration details:    Location:  Shoulder/arm   Shoulder/arm location:  L lower arm   Length (cm):  7 Treatment:    Area cleansed with:  Chlorhexidine   Amount of cleaning:   Standard Skin repair:    Repair method:  Tissue adhesive Approximation:    Approximation:  Close Repair type:    Repair type:  Simple    MEDICATIONS ORDERED IN ED: Medications  atorvastatin (LIPITOR) tablet 20 mg (has no administration in time range)  metFORMIN (GLUCOPHAGE) tablet 1,000 mg (has no administration in time range)  metoprolol tartrate (LOPRESSOR) tablet 12.5 mg (has no administration in time range)  lidocaine-EPINEPHrine-tetracaine (LET) topical gel (3 mLs Topical Given 08/29/22 0742)  Tdap (BOOSTRIX) injection 0.5 mL (0.5 mLs Intramuscular Given 08/29/22 0722)  insulin aspart (novoLOG) injection 10 Units (10 Units Subcutaneous Given 08/29/22 0745)  sodium chloride 0.9 % bolus 1,000 mL (1,000 mLs Intravenous New Bag/Given 08/29/22 0746)     IMPRESSION / MDM / ASSESSMENT AND PLAN / ED COURSE  I reviewed the triage vital signs and the nursing notes.                              Differential diagnosis includes, but is not limited to, Psychosis, delirium, medication effect, noncompliance, polysubstance abuse, Si, Hi, depression  Patient here for evaluation of attempted suicide.  Patient with laceration as described above.  Repaired.  Laboratory testing was ordered to evaluation for underlying electrolyte derangement or signs of underlying organic pathology to explain today's presentation.  Based on history and physical and laboratory evaluation, it appears that the patient's presentation is 2/2 underlying psychiatric disorder and will require further evaluation and management by inpatient psychiatry.  Patient was  made an IVC due to attempted.  Disposition pending psychiatric evaluation.    Clinical Course as of 08/29/22 1017  Wed Aug 29, 2022  4098 Patient on CMP meeting criteria for DKA given elevated anion gap low bicarb hyperglycemia will give insulin will give IV fluids.  Will check beta hydroxybutyrate as this may also be secondary to alcohol ketoacidosis. [PR]  0815  VBG without acidosis.  Beta Droxia less than 1.  I think is more likely secondary to alcohol.  Will give IV fluids will recheck BMP.  States has not been on any diabetic medications in over a year therefore very low suspicion for DKA. [PR]  1013 Gap is closed.  This is not consistent with DKA.  Will reorder his home meds.  Medically clear for psych admission.  [PR]    Clinical Course User Index [PR] Willy Eddy, MD     FINAL CLINICAL IMPRESSION(S) / ED DIAGNOSES   Final diagnoses:  Hyperglycemia  Suicidal ideation  Deliberate self-cutting     Rx / DC Orders   ED Discharge Orders     None        Note:  This document was prepared using Dragon voice recognition software and may include unintentional dictation errors.    Willy Eddy, MD 08/29/22 1016

## 2022-08-29 NOTE — ED Notes (Signed)
VOL/Consult Ordered/Pending  

## 2022-08-29 NOTE — Progress Notes (Addendum)
Admission Note:  32 yr male who presents voluntary in no acute distress for the treatment of SI and Depression and anxiety Pt appears depressed. Pt was calm and cooperative with admission process. Pt presents with passive SI and contracts for safety upon admission. Pt denies AVH . Pt   Pt has Past medical history  of DM without complication . Marland Kitchen Skin was assessed and found to be clear of any abnormal marks the patient searched and no  contraband found, POC and unit policies explained and understanding verbalized. Consents obtained. Food and fluids offered, and fluids accepted. Pt had no additional questions or concerns. Pt recently lost his job and finances are a stressor.Pt list mother as main support.. Pt has dressing on left forearm which was glued in the ED.

## 2022-08-29 NOTE — Consult Note (Addendum)
[facility] Face-to-Face Psychiatry Consult   Reason for Consult:  Admit Referring Physician:  Dr. Willy Eddy Patient Identification: Dylan Watts MRN:  [id number] Principal Diagnosis: Major depressive disorder Diagnosis:  Principal Problem:   Major depressive disorder  Total Time spent with patient: 45 minutes  Subjective:   Dylan Watts is a 32 y.o. male patient w/ history of diabetes, prior history of DKA, admitted with suicidal ideations and lacerations.  HPI:    Patient seen at bedside. Has his left arm held up. He states this is to let glue dry which is holding together several lacerations on his left arm. Patient reports he presented to the emergency department earlier this morning because "I kind of had like a panic attack, metal breakdown, kind of thing. Had a few drinks earlier. Didn't want to be here anymore. I wanted everything to stop." He reports cutting himself during this episode. He reports he had 4 to 5 beers prior to cutting himself.   Patient reports history of depression that has been undiagnosed. He reports worsening depression and anxiety for the past 5 or 6 months. 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 He reports poor sleep, sleeping 3 to 4 hours/night, and poor appetite. Pt reports feeling hopeless and worthless. He is feeling tired every day. He does not feel he has energy. He reports passive suicidal ideations. He denies an immediate plan, although notes thoughts that if he died it would be ok. He reports he has not been taking his insulin because of lapse in insurance after losing his job, and "I just didn't care".  Patient denies homicidal or violent ideations. He denies auditory visual hallucinations or paranoia.  Patient denies history of non suicidal self injurious behavior, prior  suicide attempts, or history of inpatient psychiatric hospitalizations. He reports past therapy in the 4th grade after his father passed. He felt that therapy was "alright". He denies history of prior psychiatric medication trials.   Patient reports use of alcohol, 1 or 2 times/week, 1 drink per occasion. He denies use of nicotine products, marijuana, crack/cocaine, methamphetamines, opioids or other substances.  Patient denies knowledge of family psychiatric history.   Patient reports he is living in [place] with his friend/roommate [name].   Patient gave verbal consent to speak with his mother, provides mother's name, Dylan Watts, and phone number [phone]. Dylan Watts reports patient has been "going through some things". Reports patient has had financial issues, lost his job, and lost a romantic relationship. She notes patient has had significant weight loss. She reports earlier this morning she was contacted by patient's friend who sent her a picture of patient's slit wrist. She states she lives in [place] and went to [place] to bring him to the hospital. She expresses safety concerns about patient going back home. She feels that he will "sink back into the same routine if he goes back there".   Past Psychiatric History: No significant psych history documented. Per chart review, there is a note in 2020 indicating pt was seen for anxiety and depression.   Risk to Self: Yes Risk to Others: Yes Prior Inpatient Therapy: No Prior Outpatient Therapy: Yes  Past Medical History:  Past Medical History:  Diagnosis Date   Diabetes mellitus without complication (HCC)    DKA (diabetic  ketoacidoses) 04/2019    Past Surgical History:  Procedure Laterality Date   NO PAST SURGERIES     Family History:  Family History  Problem Relation Age of Onset   Diabetes Mother    Diabetes Father    Lupus Father    Diabetes Maternal Grandmother    Hypertension Maternal Grandmother    Diabetes Paternal  Grandmother    Stroke Paternal Grandmother    Hypertension Paternal Grandmother    Family Psychiatric  History: Patient denies knowledge Social History:  Social History   Substance and Sexual Activity  Alcohol Use Yes   Comment: socially     Social History   Substance and Sexual Activity  Drug Use No    Social History   Socioeconomic History   Marital status: Single    Spouse name: Not on file   Number of children: Not on file   Years of education: Not on file   Highest education level: Not on file  Occupational History   Not on file  Tobacco Use   Smoking status: Never   Smokeless tobacco: Never  Vaping Use   Vaping Use: Never used  Substance and Sexual Activity   Alcohol use: Yes    Comment: socially   Drug use: No   Sexual activity: Not Currently  Other Topics Concern   Not on file  Social History Narrative   Not on file   Social Determinants of Health   Financial Resource Strain: Not on file  Food Insecurity: No Food Insecurity (08/29/2022)   Hunger Vital Sign    Worried About Running Out of Food in the Last Year: Never true    Ran Out of Food in the Last Year: Never true  Transportation Needs: No Transportation Needs (08/29/2022)   PRAPARE - Administrator, Civil Service (Medical): No    Lack of Transportation (Non-Medical): No  Physical Activity: Not on file  Stress: Not on file  Social Connections: Not on file   Additional Social History:    Allergies:  No Known Allergies  Labs:  No results found for this or any previous visit (from the past 48 hour(s)).   No current facility-administered medications for this encounter.   Current Outpatient Medications  Medication Sig Dispense Refill   atorvastatin (LIPITOR) 20 MG tablet Take 1 tablet (20 mg total) by mouth daily. 30 tablet 0   FLUoxetine (PROZAC) 20 MG capsule Take 1 capsule (20 mg total) by mouth every morning. 30 capsule 1   hydrOXYzine (ATARAX) 25 MG tablet Take 1 tablet (25  mg total) by mouth 2 (two) times daily as needed. 60 tablet 1   insulin lispro (HUMALOG KWIKPEN) 100 UNIT/ML KwikPen Inject 10 Units into the skin 3 (three) times daily with meals. 15 mL 0   Insulin Glargine (BASAGLAR KWIKPEN) 100 UNIT/ML Inject 40 Units into the skin at bedtime. 15 mL 0   Insulin Pen Needle (TECHLITE PEN NEEDLES) 31G X 8 MM MISC use with basglar and humalog 100 each 0   metFORMIN (GLUCOPHAGE) 1000 MG tablet Take 1 tablet (1,000 mg total) by mouth daily with breakfast. 30 tablet 0   metoprolol tartrate (LOPRESSOR) 25 MG tablet Take 0.5 tablets (12.5 mg total) by mouth 2 (two) times daily. 60 tablet 0   traZODone (DESYREL) 50 MG tablet Take 1 tablet (50 mg total) by mouth at bedtime as needed for sleep. 30 tablet 0    Musculoskeletal: Strength & Muscle Tone:  pt lying down on assessment  Gait & Station:  pt lying down on assessment Patient leans:  pt lying down on assessment            Psychiatric Specialty Exam:  Presentation  General Appearance:  Appropriate for Environment; Other (comment) (dressed in hospital scrubs)  Eye Contact: Fair  Speech: Clear and Coherent; Slow  Speech Volume: Decreased  Handedness: Right   Mood and Affect  Mood: Depressed; Dysphoric  Affect: Flat; Tearful   Thought Process  Thought Processes: Coherent; Goal Directed; Linear  Descriptions of Associations:Intact  Orientation:Full (Time, Place and Person)  Thought Content:Logical  History of Schizophrenia/Schizoaffective disorder:No  Duration of Psychotic Symptoms:No data recorded Hallucinations:No data recorded  Ideas of Reference:None  Suicidal Thoughts:No data recorded  Homicidal Thoughts:No data recorded   Sensorium  Memory: Immediate Good; Recent Good; Remote Good  Judgment: Intact  Insight: Fair   Art therapist  Concentration: Fair  Attention Span: Fair  Recall: [employer] of  Knowledge: Fair  Language: Fair   Psychomotor Activity  Psychomotor Activity: No data recorded   Assets  Assets: Communication Skills; Desire for Improvement; Housing; Resilience; Social Support   Sleep  Sleep: No data recorded   Physical Exam: Physical Exam Constitutional:      General: He is not in acute distress.    Appearance: He is not ill-appearing, toxic-appearing or diaphoretic.  Eyes:     General: No scleral icterus. Cardiovascular:     Rate and Rhythm: Tachycardia present.  Pulmonary:     Effort: Pulmonary effort is normal. No respiratory distress.  Skin:    Comments: Lacerations noted on pt's left arm   Neurological:     Mental Status: He is alert and oriented to person, place, and time.  Psychiatric:        Attention and Perception: Attention and perception normal.        Mood and Affect: Mood is depressed. Affect is flat and tearful.        Speech: Speech normal.        Behavior: Behavior normal. Behavior is cooperative.        Thought Content: Thought content includes suicidal ideation.        Cognition and Memory: Cognition and memory normal.    Review of Systems  Constitutional:  Negative for chills and fever.  Respiratory:  Negative for shortness of breath.   Cardiovascular:  Negative for chest pain and palpitations.  Psychiatric/Behavioral:  Positive for depression, substance abuse and suicidal ideas.    Blood pressure 121/71, pulse 99, temperature 98 F (36.7 C), resp. rate 18, height 6\' 1"  (1.854 m), weight 128.5 kg, SpO2 94 %. Body mass index is 37.38 kg/m.  Treatment Plan Summary: Plan recommend inpatient psychiatric admission once medically clear  Disposition: Recommend psychiatric Inpatient admission when medically cleared.  Lauree Chandler, NP 09/19/2022 5:17 PM

## 2022-08-29 NOTE — ED Notes (Signed)
Pharmacy contacted for pyxis out of atorvastatin

## 2022-08-29 NOTE — ED Triage Notes (Addendum)
Pt arrived via POV with mother, reports prior to arrival he used a kitchen knife and made 2 lacerations to L wrist in attempt of self-harm.  Pt tearful in triage, states he has never had any previous attempt to harm himself, states he has hx of depression but is "not diagnosed" not currently on medications.  + etoh use, last drank yesterday, denies any drug use.   Pt denies any A/V hallucinations.  Bleeding controlled at this time.   Reports last tetanus shot was about 2 years ago.   Pt reports he lives with a roommate who called his mother after he cut himself.

## 2022-08-30 DIAGNOSIS — F332 Major depressive disorder, recurrent severe without psychotic features: Secondary | ICD-10-CM | POA: Diagnosis not present

## 2022-08-30 LAB — GLUCOSE, CAPILLARY
Glucose-Capillary: 227 mg/dL — ABNORMAL HIGH (ref 70–99)
Glucose-Capillary: 285 mg/dL — ABNORMAL HIGH (ref 70–99)
Glucose-Capillary: 167 mg/dL — ABNORMAL HIGH (ref 70–99)
Glucose-Capillary: 208 mg/dL — ABNORMAL HIGH (ref 70–99)
Glucose-Capillary: 257 mg/dL — ABNORMAL HIGH (ref 70–99)

## 2022-08-30 MED ORDER — INSULIN ASPART 100 UNIT/ML IJ SOLN
10.0000 [IU] | Freq: Three times a day (TID) | INTRAMUSCULAR | Status: DC
  Administered 2022-08-30 – 2022-09-03 (×12): 10 [IU] via SUBCUTANEOUS
  Filled 2022-08-30 (×12): qty 1

## 2022-08-30 NOTE — Plan of Care (Signed)
D- Patient alert and oriented. Patient presented in a depressed, but pleasant mood on assessment reporting that he slept "good" last night, "the sleep meds helped". Patient had complaints of left wrist pain, rating his pain an "8/10", in which he requested PRN pain medication to help with relief. Patient endorsed hopelessness, depression, and anxiety, stating "I don't know, I just feel like I have nowhere to go, it's kind of tough", which is why he's feeling this way. Patient denied SI, HI, AVH at this time. Patient's goal, per his self-inventory, is "being hopeful", in which he will "converse", in order to achieve his goal. Patient also wanted staff to know that "I appreciate everyone being so nice and helpful".  A- Scheduled medications administered to patient, per MD orders. Support and encouragement provided.  Routine safety checks conducted every 15 minutes.  Patient informed to notify staff with problems or concerns.  R- No adverse drug reactions noted. Patient contracts for safety at this time. Patient compliant with medications and treatment plan. Patient receptive, calm, and cooperative. Patient interacts well with others on the unit. Patient remains safe at this time.  Problem: Education: Goal: Knowledge of General Education information will improve Description: Including pain rating scale, medication(s)/side effects and non-pharmacologic comfort measures Outcome: Not Progressing   Problem: Health Behavior/Discharge Planning: Goal: Ability to manage health-related needs will improve Outcome: Not Progressing   Problem: Clinical Measurements: Goal: Ability to maintain clinical measurements within normal limits will improve Outcome: Not Progressing Goal: Will remain free from infection Outcome: Not Progressing Goal: Diagnostic test results will improve Outcome: Not Progressing Goal: Respiratory complications will improve Outcome: Not Progressing Goal: Cardiovascular complication will  be avoided Outcome: Not Progressing   Problem: Activity: Goal: Risk for activity intolerance will decrease Outcome: Not Progressing   Problem: Nutrition: Goal: Adequate nutrition will be maintained Outcome: Not Progressing   Problem: Coping: Goal: Level of anxiety will decrease Outcome: Not Progressing   Problem: Elimination: Goal: Will not experience complications related to bowel motility Outcome: Not Progressing Goal: Will not experience complications related to urinary retention Outcome: Not Progressing   Problem: Pain Managment: Goal: General experience of comfort will improve Outcome: Not Progressing   Problem: Safety: Goal: Ability to remain free from injury will improve Outcome: Not Progressing   Problem: Skin Integrity: Goal: Risk for impaired skin integrity will decrease Outcome: Not Progressing   Problem: Coping: Goal: Coping ability will improve Outcome: Not Progressing   Problem: Medication: Goal: Compliance with prescribed medication regimen will improve Outcome: Not Progressing   Problem: Education: Goal: Knowledge of [facility] Education information/materials will improve Outcome: Not Progressing Goal: Emotional status will improve Outcome: Not Progressing Goal: Mental status will improve Outcome: Not Progressing Goal: Verbalization of understanding the information provided will improve Outcome: Not Progressing   Problem: Activity: Goal: Interest or engagement in activities will improve Outcome: Not Progressing Goal: Sleeping patterns will improve Outcome: Not Progressing   Problem: Coping: Goal: Ability to verbalize frustrations and anger appropriately will improve Outcome: Not Progressing Goal: Ability to demonstrate self-control will improve Outcome: Not Progressing   Problem: Health Behavior/Discharge Planning: Goal: Identification of resources available to assist in meeting health care needs will improve Outcome: Not  Progressing Goal: Compliance with treatment plan for underlying cause of condition will improve Outcome: Not Progressing   Problem: Physical Regulation: Goal: Ability to maintain clinical measurements within normal limits will improve Outcome: Not Progressing   Problem: Safety: Goal: Periods of time without injury will increase Outcome: Not Progressing  Problem: Education: Goal: Utilization of techniques to improve thought processes will improve Outcome: Not Progressing Goal: Knowledge of the prescribed therapeutic regimen will improve Outcome: Not Progressing   Problem: Activity: Goal: Interest or engagement in leisure activities will improve Outcome: Not Progressing Goal: Imbalance in normal sleep/wake cycle will improve Outcome: Not Progressing   Problem: Coping: Goal: Coping ability will improve Outcome: Not Progressing Goal: Will verbalize feelings Outcome: Not Progressing   Problem: Health Behavior/Discharge Planning: Goal: Ability to make decisions will improve Outcome: Not Progressing Goal: Compliance with therapeutic regimen will improve Outcome: Not Progressing   Problem: Role Relationship: Goal: Will demonstrate positive changes in social behaviors and relationships Outcome: Not Progressing   Problem: Safety: Goal: Ability to disclose and discuss suicidal ideas will improve Outcome: Not Progressing Goal: Ability to identify and utilize support systems that promote safety will improve Outcome: Not Progressing   Problem: Self-Concept: Goal: Will verbalize positive feelings about self Outcome: Not Progressing Goal: Level of anxiety will decrease Outcome: Not Progressing

## 2022-08-30 NOTE — Group Note (Signed)
Date:  08/30/2022 Time:  4:23 PM  Group Topic/Focus:  Goals Group:   The focus of this group is to help patients establish daily goals to achieve during treatment and discuss how the patient can incorporate goal setting into their daily lives to aide in recovery. Personal Choices and Values:   The focus of this group is to help patients assess and explore the importance of values in their lives, how their values affect their decisions, how they express their values and what opposes their expression. Self Care:   The focus of this group is to help patients understand the importance of self-care in order to improve or restore emotional, physical, spiritual, interpersonal, and financial health.    Participation Level:  Active  Participation Quality:  Appropriate  Affect:  Appropriate  Cognitive:  Alert and Appropriate  Insight: Appropriate  Engagement in Group:  Engaged  Modes of Intervention:  Activity and Discussion  Additional Comments:    [name] 08/30/2022, 4:23 PM

## 2022-08-30 NOTE — Group Note (Signed)
Date:  08/30/2022 Time:  10:07 PM  Group Topic/Focus:  Wrap-Up Group:   The focus of this group is to help patients review their daily goal of treatment and discuss progress on daily workbooks.    Participation Level:  Active  Participation Quality:  Appropriate and Attentive  Affect:  Appropriate  Cognitive:  Appropriate  Insight: Good  Engagement in Group:  Developing/Improving  Modes of Intervention:  Clarification and Discussion  Additional Comments:     Estevon Fluke 08/30/2022, 10:07 PM

## 2022-08-30 NOTE — Group Note (Signed)
[facility] LCSW Group Therapy Note   Group Date: 08/30/2022 Start Time: 1330 End Time: 1420   Type of Therapy/Topic:  Group Therapy:  Balance in Life  Participation Level:  Did Not Attend   Description of Group:    This group will address the concept of balance and how it feels and looks when one is unbalanced. Patients will be encouraged to process areas in their lives that are out of balance, and identify reasons for remaining unbalanced. Facilitators will guide patients utilizing problem- solving interventions to address and correct the stressor making their life unbalanced. Understanding and applying boundaries will be explored and addressed for obtaining  and maintaining a balanced life. Patients will be encouraged to explore ways to assertively make their unbalanced needs known to significant others in their lives, using other group members and facilitator for support and feedback.  Therapeutic Goals: Patient will identify two or more emotions or situations they have that consume much of in their lives. Patient will identify signs/triggers that life has become out of balance:  Patient will identify two ways to set boundaries in order to achieve balance in their lives:  Patient will demonstrate ability to communicate their needs through discussion and/or role plays  Summary of Patient Progress: X   Therapeutic Modalities:   Cognitive Behavioral Therapy Solution-Focused Therapy Assertiveness Training   Glenis Smoker, LCSW

## 2022-08-30 NOTE — Progress Notes (Signed)
CBG was 285 at 0816. Patient's first CBG was taken at 0622, which was too early for 0800 Insulin. Patient had already eaten breakfast by the time this writer re-checked CBG, which is probably the cause of his elevated blood sugar.

## 2022-08-30 NOTE — Progress Notes (Signed)
[facility] MD Progress Note  08/30/2022 10:51 AM Dylan Watts  MRN:  [id number] Subjective: Follow-up patient with depression.  Patient slept well last night.  He says this is the first good sleep he has had in days.  Mood is feeling a little better today.  Denies suicidal thoughts.  Blood sugars continue to run high.  No complaints regarding medicine.  Interacting well with staff and showing motivation for improvement Principal Problem: MDD (major depressive disorder) Diagnosis: Principal Problem:   MDD (major depressive disorder) Active Problems:   Diabetes mellitus (HCC)   Self-inflicted laceration of left wrist (HCC)  Total Time spent with patient: 30 minutes  Past Psychiatric History: Past history of depression  Past Medical History:  Past Medical History:  Diagnosis Date   Diabetes mellitus without complication (HCC)    DKA (diabetic ketoacidoses) 04/2019    Past Surgical History:  Procedure Laterality Date   NO PAST SURGERIES     Family History:  Family History  Problem Relation Age of Onset   Diabetes Mother    Diabetes Father    Lupus Father    Diabetes Maternal Grandmother    Hypertension Maternal Grandmother    Diabetes Paternal Grandmother    Stroke Paternal Grandmother    Hypertension Paternal Grandmother    Family Psychiatric  History: See previous Social History:  Social History   Substance and Sexual Activity  Alcohol Use Yes   Comment: socially     Social History   Substance and Sexual Activity  Drug Use No    Social History   Socioeconomic History   Marital status: Single    Spouse name: Not on file   Number of children: Not on file   Years of education: Not on file   Highest education level: Not on file  Occupational History   Not on file  Tobacco Use   Smoking status: Never   Smokeless tobacco: Never  Vaping Use   Vaping Use: Never used  Substance and Sexual Activity   Alcohol use: Yes    Comment: socially   Drug use: No   Sexual  activity: Not Currently  Other Topics Concern   Not on file  Social History Narrative   Not on file   Social Determinants of Health   Financial Resource Strain: Not on file  Food Insecurity: No Food Insecurity (08/29/2022)   Hunger Vital Sign    Worried About Running Out of Food in the Last Year: Never true    Ran Out of Food in the Last Year: Never true  Transportation Needs: No Transportation Needs (08/29/2022)   PRAPARE - Administrator, Civil Service (Medical): No    Lack of Transportation (Non-Medical): No  Physical Activity: Not on file  Stress: Not on file  Social Connections: Not on file   Additional Social History:                         Sleep: Fair  Appetite:  Fair  Current Medications: Current Facility-Administered Medications  Medication Dose Route Frequency Provider Last Rate Last Admin   acetaminophen (TYLENOL) tablet 650 mg  650 mg Oral Q6H PRN Lauree Chandler, NP   650 mg at 08/30/22 0822   alum & mag hydroxide-simeth (MAALOX/MYLANTA) 200-200-20 MG/5ML suspension 30 mL  30 mL Oral Q4H PRN Lauree Chandler, NP       atorvastatin (LIPITOR) tablet 20 mg  20 mg Oral Daily Lauree Chandler, NP  20 mg at 08/30/22 6962   diphenhydrAMINE (BENADRYL) capsule 50 mg  50 mg Oral TID PRN Lauree Chandler, NP       Or   diphenhydrAMINE (BENADRYL) injection 50 mg  50 mg Intramuscular TID PRN Lauree Chandler, NP       FLUoxetine (PROZAC) capsule 20 mg  20 mg Oral Daily Anslee Micheletti T, MD   20 mg at 08/30/22 9528   haloperidol (HALDOL) tablet 5 mg  5 mg Oral TID PRN Lauree Chandler, NP       Or   haloperidol lactate (HALDOL) injection 5 mg  5 mg Intramuscular TID PRN Lauree Chandler, NP       hydrOXYzine (ATARAX) tablet 25 mg  25 mg Oral TID PRN Lauree Chandler, NP       insulin aspart (novoLOG) injection 0-15 Units  0-15 Units Subcutaneous TID WC Lauree Chandler, NP   8 Units at 08/30/22 4132   insulin aspart (novoLOG)  injection 0-5 Units  0-5 Units Subcutaneous QHS Lauree Chandler, NP   4 Units at 08/29/22 2132   insulin aspart (novoLOG) injection 10 Units  10 Units Subcutaneous TID WC Esma Kilts, Jackquline Denmark, MD       insulin glargine-yfgn [facility]) injection 40 Units  40 Units Subcutaneous QHS Haly Feher, Jackquline Denmark, MD   40 Units at 08/29/22 2138   LORazepam (ATIVAN) tablet 2 mg  2 mg Oral TID PRN Lauree Chandler, NP       Or   LORazepam (ATIVAN) injection 2 mg  2 mg Intramuscular TID PRN Lauree Chandler, NP       magnesium hydroxide (MILK OF MAGNESIA) suspension 30 mL  30 mL Oral Daily PRN Lauree Chandler, NP       metFORMIN (GLUCOPHAGE) tablet 1,000 mg  1,000 mg Oral Q breakfast Lauree Chandler, NP   1,000 mg at 08/30/22 4401   metoprolol tartrate (LOPRESSOR) tablet 12.5 mg  12.5 mg Oral BID Lauree Chandler, NP   12.5 mg at 08/30/22 0272   traZODone (DESYREL) tablet 50 mg  50 mg Oral QHS PRN Lauree Chandler, NP   50 mg at 08/29/22 2132    Lab Results:  Results for orders placed or performed during the hospital encounter of 08/29/22 (from the past 48 hour(s))  Glucose, capillary     Status: Abnormal   Collection Time: 08/29/22  4:07 PM  Result Value Ref Range   Glucose-Capillary 232 (H) 70 - 99 mg/dL    Comment: Glucose reference range applies only to samples taken after fasting for at least 8 hours.  Glucose, capillary     Status: Abnormal   Collection Time: 08/29/22  8:14 PM  Result Value Ref Range   Glucose-Capillary 343 (H) 70 - 99 mg/dL    Comment: Glucose reference range applies only to samples taken after fasting for at least 8 hours.   Comment 1 Notify RN   Glucose, capillary     Status: Abnormal   Collection Time: 08/30/22  6:22 AM  Result Value Ref Range   Glucose-Capillary 227 (H) 70 - 99 mg/dL    Comment: Glucose reference range applies only to samples taken after fasting for at least 8 hours.  Glucose, capillary     Status: Abnormal   Collection Time: 08/30/22  8:16 AM   Result Value Ref Range   Glucose-Capillary 285 (H) 70 - 99 mg/dL    Comment: Glucose reference range applies only to samples  taken after fasting for at least 8 hours.    Blood Alcohol level:  Lab Results  Component Value Date   ETH 129 (H) 08/29/2022    Metabolic Disorder Labs: Lab Results  Component Value Date   HGBA1C 13.1 (H) 08/29/2022   MPG 329.27 08/29/2022   MPG 380.93 05/01/2019   No results found for: "PROLACTIN" Lab Results  Component Value Date   CHOL 140 01/22/2019   TRIG 84 01/22/2019   HDL 33 (L) 01/22/2019   CHOLHDL 4.2 01/22/2019   LDLCALC 91 01/22/2019    Physical Findings: AIMS: Facial and Oral Movements Muscles of Facial Expression: None, normal Lips and Perioral Area: None, normal Jaw: None, normal Tongue: None, normal,Extremity Movements Upper (arms, wrists, hands, fingers): None, normal Lower (legs, knees, ankles, toes): None, normal, Trunk Movements Neck, shoulders, hips: None, normal, Overall Severity Severity of abnormal movements (highest score from questions above): None, normal Incapacitation due to abnormal movements: None, normal Patient's awareness of abnormal movements (rate only patient's report): No Awareness, Dental Status Current problems with teeth and/or dentures?: No Does patient usually wear dentures?: No  CIWA:    COWS:     Musculoskeletal: Strength & Muscle Tone: within normal limits Gait & Station: normal Patient leans: N/A  Psychiatric Specialty Exam:  Presentation  General Appearance:  Appropriate for Environment; Other (comment) (dressed in hospital scrubs)  Eye Contact: Fair  Speech: Clear and Coherent; Slow  Speech Volume: Decreased  Handedness: Right   Mood and Affect  Mood: Depressed; Dysphoric  Affect: Flat; Tearful   Thought Process  Thought Processes: Coherent; Goal Directed; Linear  Descriptions of Associations:Intact  Orientation:Full (Time, Place and Person)  Thought  Content:Logical  History of Schizophrenia/Schizoaffective disorder:No  Duration of Psychotic Symptoms:No data recorded Hallucinations:Hallucinations: None  Ideas of Reference:None  Suicidal Thoughts:Suicidal Thoughts: Yes, Passive  Homicidal Thoughts:Homicidal Thoughts: No   Sensorium  Memory: Immediate Good; Recent Good; Remote Good  Judgment: Intact  Insight: Fair   Art therapist  Concentration: Fair  Attention Span: Fair  Recall: [employer] of Knowledge: Fair  Language: Fair   Psychomotor Activity  Psychomotor Activity: Psychomotor Activity: Normal   Assets  Assets: Manufacturing systems engineer; Desire for Improvement; Housing; Resilience; Social Support   Sleep  Sleep: Sleep: Poor Number of Hours of Sleep: 0 (3 to 4 hours/night)    Physical Exam: Physical Exam Vitals and nursing note reviewed.  Constitutional:      Appearance: Normal appearance.  HENT:     Head: Normocephalic and atraumatic.     Mouth/Throat:     Pharynx: Oropharynx is clear.  Eyes:     Pupils: Pupils are equal, round, and reactive to light.  Cardiovascular:     Rate and Rhythm: Normal rate and regular rhythm.  Pulmonary:     Effort: Pulmonary effort is normal.     Breath sounds: Normal breath sounds.  Abdominal:     General: Abdomen is flat.     Palpations: Abdomen is soft.  Musculoskeletal:        General: Normal range of motion.  Skin:    General: Skin is warm and dry.  Neurological:     General: No focal deficit present.     Mental Status: He is alert. Mental status is at baseline.  Psychiatric:        Attention and Perception: Attention normal.        Mood and Affect: Mood normal.        Speech: Speech normal.  Behavior: Behavior normal.        Thought Content: Thought content normal.        Cognition and Memory: Cognition normal.    Review of Systems  Constitutional: Negative.   HENT: Negative.    Eyes: Negative.   Respiratory: Negative.     Cardiovascular: Negative.   Gastrointestinal: Negative.   Musculoskeletal: Negative.   Skin: Negative.   Neurological: Negative.   Psychiatric/Behavioral: Negative.     Blood pressure (!) 142/110, pulse (!) 107, temperature 97.8 F (36.6 C), temperature source Oral, resp. rate 18, height 6\' 1"  (1.854 m), weight (!) 138.3 kg, SpO2 98 %. Body mass index is 40.24 kg/m.   Treatment Plan Summary: Medication management and Plan started Prozac yesterday.  No change to medicine today.  Increase his mealtime short-acting insulin to 10 units which was what he quoted as his previous dose.  Continue all other treatment for diabetes and blood sugar checks.  Encourage group attendance.  Continue assessment of mood symptoms.  Blood pressure elevated this morning we will keep an eye on that and see if we need to adjust medicine.  Mordecai Rasmussen, MD 08/30/2022, 10:51 AM

## 2022-08-30 NOTE — Group Note (Signed)
Date:  08/30/2022 Time:  8:27 PM  Group Topic/Focus:  Wrap-Up Group:   The focus of this group is to help patients review their daily goal of treatment and discuss progress on daily workbooks.    Participation Level:  Active  Participation Quality:  Appropriate, Attentive, and Supportive  Affect:  Appropriate  Cognitive:  Appropriate  Insight: Appropriate  Engagement in Group:  Engaged and Supportive  Modes of Intervention:  Activity, Clarification, Discussion, and Support  Additional Comments:     [name] 08/30/2022, 8:27 PM

## 2022-08-31 DIAGNOSIS — F332 Major depressive disorder, recurrent severe without psychotic features: Secondary | ICD-10-CM | POA: Diagnosis not present

## 2022-08-31 LAB — GLUCOSE, CAPILLARY
Glucose-Capillary: 183 mg/dL — ABNORMAL HIGH (ref 70–99)
Glucose-Capillary: 206 mg/dL — ABNORMAL HIGH (ref 70–99)
Glucose-Capillary: 110 mg/dL — ABNORMAL HIGH (ref 70–99)
Glucose-Capillary: 189 mg/dL — ABNORMAL HIGH (ref 70–99)

## 2022-08-31 NOTE — BH IP Treatment Plan (Signed)
Interdisciplinary Treatment and Diagnostic Plan Update  08/31/2022 Time of Session: 9:14 AM Method Dylan Watts MRN: [id number]  Principal Diagnosis: MDD (major depressive disorder)  Secondary Diagnoses: Principal Problem:   MDD (major depressive disorder) Active Problems:   Diabetes mellitus (HCC)   Self-inflicted laceration of left wrist (HCC)   Current Medications:  Current Facility-Administered Medications  Medication Dose Route Frequency Provider Last Rate Last Admin   acetaminophen (TYLENOL) tablet 650 mg  650 mg Oral Q6H PRN Lauree Chandler, NP   650 mg at 08/30/22 0822   alum & mag hydroxide-simeth (MAALOX/MYLANTA) 200-200-20 MG/5ML suspension 30 mL  30 mL Oral Q4H PRN Lauree Chandler, NP       atorvastatin (LIPITOR) tablet 20 mg  20 mg Oral Daily Lauree Chandler, NP   20 mg at 08/31/22 0751   diphenhydrAMINE (BENADRYL) capsule 50 mg  50 mg Oral TID PRN Lauree Chandler, NP       Or   diphenhydrAMINE (BENADRYL) injection 50 mg  50 mg Intramuscular TID PRN Lauree Chandler, NP       FLUoxetine (PROZAC) capsule 20 mg  20 mg Oral Daily Clapacs, John T, MD   20 mg at 08/31/22 0751   haloperidol (HALDOL) tablet 5 mg  5 mg Oral TID PRN Lauree Chandler, NP       Or   haloperidol lactate (HALDOL) injection 5 mg  5 mg Intramuscular TID PRN Lauree Chandler, NP       hydrOXYzine (ATARAX) tablet 25 mg  25 mg Oral TID PRN Lauree Chandler, NP       insulin aspart (novoLOG) injection 0-15 Units  0-15 Units Subcutaneous TID WC Lauree Chandler, NP   3 Units at 08/31/22 0751   insulin aspart (novoLOG) injection 0-5 Units  0-5 Units Subcutaneous QHS Lauree Chandler, NP   3 Units at 08/30/22 2110   insulin aspart (novoLOG) injection 10 Units  10 Units Subcutaneous TID WC Clapacs, Jackquline Denmark, MD   10 Units at 08/31/22 0750   insulin glargine-yfgn (SEMGLEE) injection 40 Units  40 Units Subcutaneous QHS Clapacs, John T, MD   40 Units at 08/30/22 2113   LORazepam (ATIVAN)  tablet 2 mg  2 mg Oral TID PRN Lauree Chandler, NP       Or   LORazepam (ATIVAN) injection 2 mg  2 mg Intramuscular TID PRN Lauree Chandler, NP       magnesium hydroxide (MILK OF MAGNESIA) suspension 30 mL  30 mL Oral Daily PRN Lauree Chandler, NP       metFORMIN (GLUCOPHAGE) tablet 1,000 mg  1,000 mg Oral Q breakfast Lauree Chandler, NP   1,000 mg at 08/31/22 0751   metoprolol tartrate (LOPRESSOR) tablet 12.5 mg  12.5 mg Oral BID Lauree Chandler, NP   12.5 mg at 08/31/22 0751   traZODone (DESYREL) tablet 50 mg  50 mg Oral QHS PRN Lauree Chandler, NP   50 mg at 08/29/22 2132   PTA Medications: Medications Prior to Admission  Medication Sig Dispense Refill Last Dose   atorvastatin (LIPITOR) 20 MG tablet Take 1 tablet (20 mg total) by mouth daily. (Patient not taking: Reported on 08/30/2022) 30 tablet 2 Not Taking   metFORMIN (GLUCOPHAGE) 1000 MG tablet Take 1 tab po every morning for 1 week. After 1 week, may increase to 1 tab po BID. (Patient not taking: Reported on 08/30/2022) 60 tablet 2 Not Taking   metoprolol tartrate (LOPRESSOR)  25 MG tablet Take 0.5 tablets (12.5 mg total) by mouth 2 (two) times daily. (Patient not taking: Reported on 08/30/2022) 60 tablet 3 Not Taking   Multiple Vitamins-Minerals (MULTIVITAMIN WITH MINERALS) tablet Take 1 tablet by mouth daily. (Patient not taking: Reported on 08/30/2022)   Not Taking   venlafaxine XR (EFFEXOR-XR) 75 MG 24 hr capsule Take 1 capsule (75 mg total) by mouth daily with breakfast. To help with anxiety (Patient not taking: Reported on 08/30/2022) 30 capsule 3 Not Taking   vitamin E (VITAMIN E) 180 MG (400 UNITS) capsule Take 400 Units by mouth daily. (Patient not taking: Reported on 08/30/2022)   Not Taking    Patient Stressors: Financial difficulties    Patient Strengths: Ability for insight  Average or above average intelligence  Capable of independent living  Motivation for treatment/growth  Physical Health  Supportive  family/friends  Work skills   Treatment Modalities: Medication Management, Group therapy, Case management,  1 to 1 session with clinician, Psychoeducation, Recreational therapy.   Physician Treatment Plan for Primary Diagnosis: MDD (major depressive disorder) Long Term Goal(s): Improvement in symptoms so as ready for discharge   Short Term Goals: Ability to maintain clinical measurements within normal limits will improve Compliance with prescribed medications will improve Ability to verbalize feelings will improve Ability to disclose and discuss suicidal ideas  Medication Management: Evaluate patient's response, side effects, and tolerance of medication regimen.  Therapeutic Interventions: 1 to 1 sessions, Unit Group sessions and Medication administration.  Evaluation of Outcomes: Progressing  Physician Treatment Plan for Secondary Diagnosis: Principal Problem:   MDD (major depressive disorder) Active Problems:   Diabetes mellitus (HCC)   Self-inflicted laceration of left wrist (HCC)  Long Term Goal(s): Improvement in symptoms so as ready for discharge   Short Term Goals: Ability to maintain clinical measurements within normal limits will improve Compliance with prescribed medications will improve Ability to verbalize feelings will improve Ability to disclose and discuss suicidal ideas     Medication Management: Evaluate patient's response, side effects, and tolerance of medication regimen.  Therapeutic Interventions: 1 to 1 sessions, Unit Group sessions and Medication administration.  Evaluation of Outcomes: Progressing   RN Treatment Plan for Primary Diagnosis: MDD (major depressive disorder) Long Term Goal(s): Knowledge of disease and therapeutic regimen to maintain health will improve  Short Term Goals: Ability to demonstrate self-control, Ability to participate in decision making will improve, Ability to verbalize feelings will improve, Ability to disclose and discuss  suicidal ideas, Ability to identify and develop effective coping behaviors will improve, and Compliance with prescribed medications will improve  Medication Management: RN will administer medications as ordered by provider, will assess and evaluate patient's response and provide education to patient for prescribed medication. RN will report any adverse and/or side effects to prescribing provider.  Therapeutic Interventions: 1 on 1 counseling sessions, Psychoeducation, Medication administration, Evaluate responses to treatment, Monitor vital signs and CBGs as ordered, Perform/monitor CIWA, COWS, AIMS and Fall Risk screenings as ordered, Perform wound care treatments as ordered.  Evaluation of Outcomes: Progressing   LCSW Treatment Plan for Primary Diagnosis: MDD (major depressive disorder) Long Term Goal(s): Safe transition to appropriate next level of care at discharge, Engage patient in therapeutic group addressing interpersonal concerns.  Short Term Goals: Engage patient in aftercare planning with referrals and resources, Increase social support, Increase ability to appropriately verbalize feelings, Increase emotional regulation, Facilitate acceptance of mental health diagnosis and concerns, and Increase skills for wellness and recovery  Therapeutic Interventions: Assess  for all discharge needs, 1 to 1 time with Child psychotherapist, Explore available resources and support systems, Assess for adequacy in community support network, Educate family and significant other(s) on suicide prevention, Complete Psychosocial Assessment, Interpersonal group therapy.  Evaluation of Outcomes: Progressing   Progress in Treatment: Attending groups: Yes. Participating in groups: Yes. Taking medication as prescribed: Yes. Toleration medication: Yes. Family/Significant other contact made: No, will contact:  once permission is given. Patient understands diagnosis: Yes. Discussing patient identified problems/goals  with staff: Yes. Medical problems stabilized or resolved: Yes. Denies suicidal/homicidal ideation: Yes. Issues/concerns per patient self-inventory: No. Other: none  New problem(s) identified: No, Describe:  none  New Short Term/Long Term Goal(s): detox, elimination of symptoms of psychosis, medication management for mood stabilization; elimination of SI thoughts; development of comprehensive mental wellness/sobriety plan.   Patient Goals:  "learn to rely on people more because I can't bottle things up"  Discharge Plan or Barriers: CSW to assist in the development of appropriate discharge plans.   Reason for Continuation of Hospitalization: Anxiety Depression Medication stabilization Suicidal ideation  Estimated Length of Stay:  1-7 days  Last 3 [place] Suicide Severity Risk Score: Flowsheet Row Admission (Current) from 08/29/2022 in [facility] Low INPATIENT BEHAVIORAL MEDICINE Most recent reading at 08/29/2022  5:33 PM ED from 08/29/2022 in [facility] Emergency Department at [facility] Most recent reading at 08/29/2022  4:35 AM  C-SSRS RISK CATEGORY Error: Q3, 4, or 5 should not be populated when Q2 is No High Risk       Last PHQ 2/9 Scores:    01/22/2019   10:09 AM  Depression screen PHQ 2/9  Decreased Interest 3  Down, Depressed, Hopeless 3  PHQ - 2 Score 6  Altered sleeping 3  Tired, decreased energy 3  Change in appetite 3  Feeling bad or failure about yourself  3  Trouble concentrating 2  Moving slowly or fidgety/restless 1  Suicidal thoughts 1  PHQ-9 Score 22    Scribe for Treatment Team: Harden Mo, LCSW 08/31/2022 10:24 AM

## 2022-08-31 NOTE — Progress Notes (Signed)
[facility] MD Progress Note  08/31/2022 4:07 PM Dylan Watts  MRN:  [id number] Subjective: Patient seen and chart reviewed.  32 year old man with depression.  Mood a little better.  No suicidal thoughts today.  Tolerating medicine well.  Attending groups.  Activity level good.  Took a look at his arm where he cut it.  No sign of infection seems to be healing appropriately.  Blood sugars still running high but certainly better than when he came in. Principal Problem: MDD (major depressive disorder) Diagnosis: Principal Problem:   MDD (major depressive disorder) Active Problems:   Diabetes mellitus (HCC)   Self-inflicted laceration of left wrist (HCC)  Total Time spent with patient: 30 minutes  Past Psychiatric History: Past history of diabetes and depression  Past Medical History:  Past Medical History:  Diagnosis Date   Diabetes mellitus without complication (HCC)    DKA (diabetic ketoacidoses) 04/2019    Past Surgical History:  Procedure Laterality Date   NO PAST SURGERIES     Family History:  Family History  Problem Relation Age of Onset   Diabetes Mother    Diabetes Father    Lupus Father    Diabetes Maternal Grandmother    Hypertension Maternal Grandmother    Diabetes Paternal Grandmother    Stroke Paternal Grandmother    Hypertension Paternal Grandmother    Family Psychiatric  History: See previous Social History:  Social History   Substance and Sexual Activity  Alcohol Use Yes   Comment: socially     Social History   Substance and Sexual Activity  Drug Use No    Social History   Socioeconomic History   Marital status: Single    Spouse name: Not on file   Number of children: Not on file   Years of education: Not on file   Highest education level: Not on file  Occupational History   Not on file  Tobacco Use   Smoking status: Never   Smokeless tobacco: Never  Vaping Use   Vaping Use: Never used  Substance and Sexual Activity   Alcohol use: Yes     Comment: socially   Drug use: No   Sexual activity: Not Currently  Other Topics Concern   Not on file  Social History Narrative   Not on file   Social Determinants of Health   Financial Resource Strain: Not on file  Food Insecurity: No Food Insecurity (08/29/2022)   Hunger Vital Sign    Worried About Running Out of Food in the Last Year: Never true    Ran Out of Food in the Last Year: Never true  Transportation Needs: No Transportation Needs (08/29/2022)   PRAPARE - Administrator, Civil Service (Medical): No    Lack of Transportation (Non-Medical): No  Physical Activity: Not on file  Stress: Not on file  Social Connections: Not on file   Additional Social History:                         Sleep: Fair  Appetite:  Fair  Current Medications: Current Facility-Administered Medications  Medication Dose Route Frequency Provider Last Rate Last Admin   acetaminophen (TYLENOL) tablet 650 mg  650 mg Oral Q6H PRN Lauree Chandler, NP   650 mg at 08/30/22 0822   alum & mag hydroxide-simeth (MAALOX/MYLANTA) 200-200-20 MG/5ML suspension 30 mL  30 mL Oral Q4H PRN Lauree Chandler, NP       atorvastatin (LIPITOR) tablet 20 mg  20 mg Oral Daily Lauree Chandler, NP   20 mg at 08/31/22 0751   diphenhydrAMINE (BENADRYL) capsule 50 mg  50 mg Oral TID PRN Lauree Chandler, NP       Or   diphenhydrAMINE (BENADRYL) injection 50 mg  50 mg Intramuscular TID PRN Lauree Chandler, NP       FLUoxetine (PROZAC) capsule 20 mg  20 mg Oral Daily Ossiel Marchio T, MD   20 mg at 08/31/22 0751   haloperidol (HALDOL) tablet 5 mg  5 mg Oral TID PRN Lauree Chandler, NP       Or   haloperidol lactate (HALDOL) injection 5 mg  5 mg Intramuscular TID PRN Lauree Chandler, NP       hydrOXYzine (ATARAX) tablet 25 mg  25 mg Oral TID PRN Lauree Chandler, NP       insulin aspart (novoLOG) injection 0-15 Units  0-15 Units Subcutaneous TID WC Lauree Chandler, NP   5 Units at  08/31/22 1211   insulin aspart (novoLOG) injection 0-5 Units  0-5 Units Subcutaneous QHS Lauree Chandler, NP   3 Units at 08/30/22 2110   insulin aspart (novoLOG) injection 10 Units  10 Units Subcutaneous TID WC Kiril Hippe, Jackquline Denmark, MD   10 Units at 08/31/22 1211   insulin glargine-yfgn (SEMGLEE) injection 40 Units  40 Units Subcutaneous QHS Julyanna Scholle T, MD   40 Units at 08/30/22 2113   LORazepam (ATIVAN) tablet 2 mg  2 mg Oral TID PRN Lauree Chandler, NP       Or   LORazepam (ATIVAN) injection 2 mg  2 mg Intramuscular TID PRN Lauree Chandler, NP       magnesium hydroxide (MILK OF MAGNESIA) suspension 30 mL  30 mL Oral Daily PRN Lauree Chandler, NP       metFORMIN (GLUCOPHAGE) tablet 1,000 mg  1,000 mg Oral Q breakfast Lauree Chandler, NP   1,000 mg at 08/31/22 0751   metoprolol tartrate (LOPRESSOR) tablet 12.5 mg  12.5 mg Oral BID Lauree Chandler, NP   12.5 mg at 08/31/22 0751   traZODone (DESYREL) tablet 50 mg  50 mg Oral QHS PRN Lauree Chandler, NP   50 mg at 08/29/22 2132    Lab Results:  Results for orders placed or performed during the hospital encounter of 08/29/22 (from the past 48 hour(s))  Glucose, capillary     Status: Abnormal   Collection Time: 08/29/22  8:14 PM  Result Value Ref Range   Glucose-Capillary 343 (H) 70 - 99 mg/dL    Comment: Glucose reference range applies only to samples taken after fasting for at least 8 hours.   Comment 1 Notify RN   Glucose, capillary     Status: Abnormal   Collection Time: 08/30/22  6:22 AM  Result Value Ref Range   Glucose-Capillary 227 (H) 70 - 99 mg/dL    Comment: Glucose reference range applies only to samples taken after fasting for at least 8 hours.  Glucose, capillary     Status: Abnormal   Collection Time: 08/30/22  8:16 AM  Result Value Ref Range   Glucose-Capillary 285 (H) 70 - 99 mg/dL    Comment: Glucose reference range applies only to samples taken after fasting for at least 8 hours.  Glucose,  capillary     Status: Abnormal   Collection Time: 08/30/22 11:38 AM  Result Value Ref Range   Glucose-Capillary 167 (H) 70 - 99  mg/dL    Comment: Glucose reference range applies only to samples taken after fasting for at least 8 hours.  Glucose, capillary     Status: Abnormal   Collection Time: 08/30/22  4:39 PM  Result Value Ref Range   Glucose-Capillary 208 (H) 70 - 99 mg/dL    Comment: Glucose reference range applies only to samples taken after fasting for at least 8 hours.   Comment 1 Notify RN   Glucose, capillary     Status: Abnormal   Collection Time: 08/30/22  7:48 PM  Result Value Ref Range   Glucose-Capillary 257 (H) 70 - 99 mg/dL    Comment: Glucose reference range applies only to samples taken after fasting for at least 8 hours.  Glucose, capillary     Status: Abnormal   Collection Time: 08/31/22  6:53 AM  Result Value Ref Range   Glucose-Capillary 183 (H) 70 - 99 mg/dL    Comment: Glucose reference range applies only to samples taken after fasting for at least 8 hours.  Glucose, capillary     Status: Abnormal   Collection Time: 08/31/22 11:46 AM  Result Value Ref Range   Glucose-Capillary 206 (H) 70 - 99 mg/dL    Comment: Glucose reference range applies only to samples taken after fasting for at least 8 hours.    Blood Alcohol level:  Lab Results  Component Value Date   ETH 129 (H) 08/29/2022    Metabolic Disorder Labs: Lab Results  Component Value Date   HGBA1C 13.1 (H) 08/29/2022   MPG 329.27 08/29/2022   MPG 380.93 05/01/2019   No results found for: "PROLACTIN" Lab Results  Component Value Date   CHOL 140 01/22/2019   TRIG 84 01/22/2019   HDL 33 (L) 01/22/2019   CHOLHDL 4.2 01/22/2019   LDLCALC 91 01/22/2019    Physical Findings: AIMS: Facial and Oral Movements Muscles of Facial Expression: None, normal Lips and Perioral Area: None, normal Jaw: None, normal Tongue: None, normal,Extremity Movements Upper (arms, wrists, hands, fingers): None,  normal Lower (legs, knees, ankles, toes): None, normal, Trunk Movements Neck, shoulders, hips: None, normal, Overall Severity Severity of abnormal movements (highest score from questions above): None, normal Incapacitation due to abnormal movements: None, normal Patient's awareness of abnormal movements (rate only patient's report): No Awareness, Dental Status Current problems with teeth and/or dentures?: No Does patient usually wear dentures?: No  CIWA:    COWS:     Musculoskeletal: Strength & Muscle Tone: within normal limits Gait & Station: normal Patient leans: N/A  Psychiatric Specialty Exam:  Presentation  General Appearance:  Appropriate for Environment; Other (comment) (dressed in hospital scrubs)  Eye Contact: Fair  Speech: Clear and Coherent; Slow  Speech Volume: Decreased  Handedness: Right   Mood and Affect  Mood: Depressed; Dysphoric  Affect: Flat; Tearful   Thought Process  Thought Processes: Coherent; Goal Directed; Linear  Descriptions of Associations:Intact  Orientation:Full (Time, Place and Person)  Thought Content:Logical  History of Schizophrenia/Schizoaffective disorder:No  Duration of Psychotic Symptoms:No data recorded Hallucinations:No data recorded Ideas of Reference:None  Suicidal Thoughts:No data recorded Homicidal Thoughts:No data recorded  Sensorium  Memory: Immediate Good; Recent Good; Remote Good  Judgment: Intact  Insight: Fair   Art therapist  Concentration: Fair  Attention Span: Fair  Recall: Jennelle Human of Knowledge: Fair  Language: Fair   Psychomotor Activity  Psychomotor Activity:No data recorded  Assets  Assets: Manufacturing systems engineer; Desire for Improvement; Housing; Resilience; Social Support   Sleep  Sleep:No data  recorded   Physical Exam: Physical Exam Vitals and nursing note reviewed.  Constitutional:      Appearance: Normal appearance.  HENT:     Head:  Normocephalic and atraumatic.     Mouth/Throat:     Pharynx: Oropharynx is clear.  Eyes:     Pupils: Pupils are equal, round, and reactive to light.  Cardiovascular:     Rate and Rhythm: Normal rate and regular rhythm.  Pulmonary:     Effort: Pulmonary effort is normal.     Breath sounds: Normal breath sounds.  Abdominal:     General: Abdomen is flat.     Palpations: Abdomen is soft.  Musculoskeletal:        General: Normal range of motion.  Skin:    General: Skin is warm and dry.  Neurological:     General: No focal deficit present.     Mental Status: He is alert. Mental status is at baseline.  Psychiatric:        Attention and Perception: Attention normal.        Mood and Affect: Mood is anxious and depressed.        Speech: Speech normal.        Behavior: Behavior is cooperative.        Thought Content: Thought content normal.        Cognition and Memory: Cognition normal.    Review of Systems  Constitutional: Negative.   HENT: Negative.    Eyes: Negative.   Respiratory: Negative.    Cardiovascular: Negative.   Gastrointestinal: Negative.   Musculoskeletal: Negative.   Skin: Negative.   Neurological: Negative.   Psychiatric/Behavioral:  Positive for depression. Negative for suicidal ideas.    Blood pressure (!) 146/106, pulse 99, temperature 97.8 F (36.6 C), temperature source Oral, resp. rate 19, height 6\' 1"  (1.854 m), weight (!) 138.3 kg, SpO2 99 %. Body mass index is 40.24 kg/m.   Treatment Plan Summary: Medication management and Plan no change in antidepressant.  No change to diabetes medicine will watch it for another day then consider whether he needs to have increases possibly with help from diabetes control.  Continued encouragement.  Mordecai Rasmussen, MD 08/31/2022, 4:07 PM

## 2022-08-31 NOTE — Plan of Care (Signed)
D- Patient alert and oriented. Patient presented in a pleasant mood on assessment reporting that he slept "good" last night and had no complaints to voice to this Clinical research associate. Patient continues to endorse some hopelessness, depression, and anxiety, stating that it's from the same reasons as yesterday. Patient stated to this writer that he feels this way because "I don't know, I just feel like I have nowhere to go, it's kind of tough", however, patient stated that his mood is "getting better". Patient denied SI, HI, AVH, and pain at this time. Per his self-inventory, patient's stated goal for today is to "plan on what to do when I get out", in which he will "think", in order to achieve his goal. Patient wanted staff to know "just real appreciative of all the help and care".  A- Scheduled medications administered to patient, per MD orders. Support and encouragement provided.  Routine safety checks conducted every 15 minutes.  Patient informed to notify staff with problems or concerns.  R- No adverse drug reactions noted. Patient contracts for safety at this time. Patient compliant with medications and treatment plan. Patient receptive, calm, and cooperative. Patient interacts well with others on the unit. Patient remains safe at this time.  Problem: Education: Goal: Knowledge of General Education information will improve Description: Including pain rating scale, medication(s)/side effects and non-pharmacologic comfort measures Outcome: Progressing   Problem: Health Behavior/Discharge Planning: Goal: Ability to manage health-related needs will improve Outcome: Progressing   Problem: Clinical Measurements: Goal: Ability to maintain clinical measurements within normal limits will improve Outcome: Progressing Goal: Will remain free from infection Outcome: Progressing Goal: Diagnostic test results will improve Outcome: Progressing Goal: Respiratory complications will improve Outcome: Progressing Goal:  Cardiovascular complication will be avoided Outcome: Progressing   Problem: Activity: Goal: Risk for activity intolerance will decrease Outcome: Progressing   Problem: Nutrition: Goal: Adequate nutrition will be maintained Outcome: Progressing   Problem: Coping: Goal: Level of anxiety will decrease Outcome: Progressing   Problem: Elimination: Goal: Will not experience complications related to bowel motility Outcome: Progressing Goal: Will not experience complications related to urinary retention Outcome: Progressing   Problem: Pain Managment: Goal: General experience of comfort will improve Outcome: Progressing   Problem: Safety: Goal: Ability to remain free from injury will improve Outcome: Progressing   Problem: Skin Integrity: Goal: Risk for impaired skin integrity will decrease Outcome: Progressing   Problem: Coping: Goal: Coping ability will improve Outcome: Progressing   Problem: Medication: Goal: Compliance with prescribed medication regimen will improve Outcome: Progressing   Problem: Education: Goal: Knowledge of [place] General Education information/materials will improve Outcome: Progressing Goal: Emotional status will improve Outcome: Progressing Goal: Mental status will improve Outcome: Progressing Goal: Verbalization of understanding the information provided will improve Outcome: Progressing   Problem: Activity: Goal: Interest or engagement in activities will improve Outcome: Progressing Goal: Sleeping patterns will improve Outcome: Progressing   Problem: Coping: Goal: Ability to verbalize frustrations and anger appropriately will improve Outcome: Progressing Goal: Ability to demonstrate self-control will improve Outcome: Progressing   Problem: Health Behavior/Discharge Planning: Goal: Identification of resources available to assist in meeting health care needs will improve Outcome: Progressing Goal: Compliance with treatment plan  for underlying cause of condition will improve Outcome: Progressing   Problem: Physical Regulation: Goal: Ability to maintain clinical measurements within normal limits will improve Outcome: Progressing   Problem: Safety: Goal: Periods of time without injury will increase Outcome: Progressing   Problem: Education: Goal: Utilization of techniques to improve thought processes will  improve Outcome: Progressing Goal: Knowledge of the prescribed therapeutic regimen will improve Outcome: Progressing   Problem: Activity: Goal: Interest or engagement in leisure activities will improve Outcome: Progressing Goal: Imbalance in normal sleep/wake cycle will improve Outcome: Progressing   Problem: Coping: Goal: Coping ability will improve Outcome: Progressing Goal: Will verbalize feelings Outcome: Progressing   Problem: Health Behavior/Discharge Planning: Goal: Ability to make decisions will improve Outcome: Progressing Goal: Compliance with therapeutic regimen will improve Outcome: Progressing   Problem: Role Relationship: Goal: Will demonstrate positive changes in social behaviors and relationships Outcome: Progressing   Problem: Safety: Goal: Ability to disclose and discuss suicidal ideas will improve Outcome: Progressing Goal: Ability to identify and utilize support systems that promote safety will improve Outcome: Progressing   Problem: Self-Concept: Goal: Will verbalize positive feelings about self Outcome: Progressing Goal: Level of anxiety will decrease Outcome: Progressing

## 2022-08-31 NOTE — Group Note (Signed)
Date:  08/31/2022 Time:  11:24 AM  Group Topic/Focus:  Activity Group    Participation Level:  Active  Participation Quality:  Appropriate  Affect:  Appropriate  Cognitive:  Appropriate  Insight: Appropriate  Engagement in Group:  Engaged  Modes of Intervention:  Activity  Additional Comments:    [name] 08/31/2022, 11:24 AM

## 2022-08-31 NOTE — Progress Notes (Signed)
Patient tolerated dressing change well, without any issues. MD shown site and is aware of dressing change.   08/31/22 1014  Charting Type  Charting Type Shift assessment  Safety Check Verification  Has the RN verified the 15 minute safety check completion? Yes  Neurological  Neuro (WDL) WDL  HEENT  HEENT (WDL) WDL  Respiratory  Respiratory (WDL) WDL  Cardiac  Cardiac (WDL) X (HTN)  Vascular  Vascular (WDL) X (HTN)  Integumentary  Integumentary (WDL) WDL  Braden Scale (Ages 8 and up)  Sensory Perceptions 4  Moisture 4  Activity 4  Mobility 4  Nutrition 3  Friction and Shear 3  Braden Scale Score 22  Musculoskeletal  Musculoskeletal (WDL) X  Assistive Device None  Musculoskeletal Details  Left Wrist Injury/trauma  Gastrointestinal  Gastrointestinal (WDL) WDL  Last BM Date  08/30/22  GU Assessment  Genitourinary (WDL) WDL  Incision (Closed) 08/29/22 Arm Anterior;Left;Lower  Date First Assessed/Time First Assessed: 08/29/22 1215   Location: Arm  Location Orientation: Anterior;Left;Lower  Present on Admission: Yes  Dressing Type Gauze (Comment)  Dressing Changed  Dressing Change Frequency PRN  Site / Wound Assessment Other (Comment) (cleaned with normal saline and reinforced with non-adherent pad and gauze)  Neurological  Level of Consciousness Alert

## 2022-08-31 NOTE — Progress Notes (Signed)
Patient calm and pleasant during assessment denying SI/HI/AVH. Pt endorses anxiety and depression. Pt observed by this Clinical research associate interacting appropriately with staff and peers on the unit. Pt compliant with medication administration per MD orders. Pt given education, support, and encouragement to be active in his treatment plan. Pt being monitored Q 15 minutes for safety per unit protocol, remains safe on the unit

## 2022-09-01 ENCOUNTER — Other Ambulatory Visit: Payer: Self-pay

## 2022-09-01 DIAGNOSIS — F332 Major depressive disorder, recurrent severe without psychotic features: Secondary | ICD-10-CM | POA: Diagnosis not present

## 2022-09-01 LAB — GLUCOSE, CAPILLARY
Glucose-Capillary: 144 mg/dL — ABNORMAL HIGH (ref 70–99)
Glucose-Capillary: 114 mg/dL — ABNORMAL HIGH (ref 70–99)
Glucose-Capillary: 124 mg/dL — ABNORMAL HIGH (ref 70–99)
Glucose-Capillary: 142 mg/dL — ABNORMAL HIGH (ref 70–99)

## 2022-09-01 MED ORDER — BASAGLAR KWIKPEN 100 UNIT/ML ~~LOC~~ SOPN
40.0000 [IU] | PEN_INJECTOR | Freq: Every day | SUBCUTANEOUS | 0 refills | Status: DC
  Filled 2022-09-01: qty 10, 25d supply, fill #0
  Filled 2022-09-04: qty 15, 37d supply, fill #0

## 2022-09-01 MED ORDER — FLUOXETINE HCL 20 MG PO CAPS
20.0000 mg | ORAL_CAPSULE | Freq: Every day | ORAL | 0 refills | Status: DC
  Filled 2022-09-01: qty 30, 30d supply, fill #0

## 2022-09-01 MED ORDER — INSULIN GLARGINE-YFGN 100 UNIT/ML ~~LOC~~ SOLN: 40.0000 [IU] | Freq: Every day | SUBCUTANEOUS | 11 refills | Status: DC

## 2022-09-01 MED ORDER — INSULIN ASPART 100 UNIT/ML IJ SOLN: 10.0000 [IU] | Freq: Three times a day (TID) | INTRAMUSCULAR | 11 refills | Status: DC

## 2022-09-01 MED ORDER — ATORVASTATIN CALCIUM 20 MG PO TABS: 20.0000 mg | ORAL_TABLET | Freq: Every day | ORAL | 1 refills | Status: DC

## 2022-09-01 MED ORDER — TRAZODONE HCL 50 MG PO TABS: 50.0000 mg | ORAL_TABLET | Freq: Every evening | ORAL | 1 refills | Status: DC | PRN

## 2022-09-01 MED ORDER — FLUOXETINE HCL 20 MG PO CAPS: 20.0000 mg | ORAL_CAPSULE | Freq: Every day | ORAL | 1 refills | Status: DC

## 2022-09-01 MED ORDER — METFORMIN HCL 1000 MG PO TABS: 1000.0000 mg | ORAL_TABLET | Freq: Every day | ORAL | 1 refills | Status: DC

## 2022-09-01 MED ORDER — TRAZODONE HCL 50 MG PO TABS
50.0000 mg | ORAL_TABLET | Freq: Every evening | ORAL | 0 refills | Status: DC | PRN
  Filled 2022-09-01: qty 30, 30d supply, fill #0

## 2022-09-01 MED ORDER — HYDROXYZINE HCL 25 MG PO TABS: 25.0000 mg | ORAL_TABLET | Freq: Three times a day (TID) | ORAL | 1 refills | Status: DC | PRN

## 2022-09-01 MED ORDER — METOPROLOL TARTRATE 25 MG PO TABS
12.5000 mg | ORAL_TABLET | Freq: Two times a day (BID) | ORAL | 0 refills | Status: DC
  Filled 2022-09-01: qty 60, 60d supply, fill #0

## 2022-09-01 MED ORDER — ATORVASTATIN CALCIUM 20 MG PO TABS
20.0000 mg | ORAL_TABLET | Freq: Every day | ORAL | 0 refills | Status: DC
  Filled 2022-09-01: qty 30, 30d supply, fill #0

## 2022-09-01 MED ORDER — METOPROLOL TARTRATE 25 MG PO TABS: 12.5000 mg | ORAL_TABLET | Freq: Two times a day (BID) | ORAL | 1 refills | Status: DC

## 2022-09-01 MED ORDER — METFORMIN HCL 1000 MG PO TABS
1000.0000 mg | ORAL_TABLET | Freq: Every day | ORAL | 0 refills | Status: DC
  Filled 2022-09-01: qty 30, 30d supply, fill #0

## 2022-09-01 MED ORDER — HYDROXYZINE HCL 25 MG PO TABS
25.0000 mg | ORAL_TABLET | Freq: Three times a day (TID) | ORAL | 0 refills | Status: DC | PRN
  Filled 2022-09-01: qty 60, 20d supply, fill #0

## 2022-09-01 MED ORDER — INSULIN LISPRO (1 UNIT DIAL) 100 UNIT/ML (KWIKPEN)
10.0000 [IU] | PEN_INJECTOR | Freq: Three times a day (TID) | SUBCUTANEOUS | 0 refills | Status: DC
  Filled 2022-09-01: qty 10, 34d supply, fill #0
  Filled 2022-09-04: qty 15, 50d supply, fill #0

## 2022-09-01 NOTE — [facility] Group Notes (Addendum)
[facility] Group Notes:  (Nursing/MHT/Case Management/Adjunct)  Date:  09/01/2022  Time: 0900  Type of Therapy:  Psychoeducational Skills  Participation Level:  Active  Participation Quality:  Appropriate  Affect:  Appropriate  Cognitive:  Appropriate  Insight:  Appropriate  Engagement in Group:  Engaged  Modes of Intervention:  Discussion, Education, and Exploration  Summary of Progress/Problems: Pts were given education on how to identify mental health decompensation, and then education on how to promote healthy mental well being. Patients were asked to explore healthy coping skills and lifestyle habits that help promote well being. Pt shared and was appropriate.  Malva Limes 09/01/2022, 10:29 AM

## 2022-09-01 NOTE — Progress Notes (Signed)
Patient ID: Dylan Watts, male   DOB: 08-12-1990, 32 y.o.   MRN: [id number] Patient presents with depressed mood, affect congruent.  [name] reports he is doing '' ok'' and shares '' I see where I've been doing self sabotaging behaviors. You know just getting drunk and doing what I did and making poor choices at work. I've just had a lot on my plate that I need to get through. ''  Patient is pleasant and cooperative on the unit. He has been attending meals and programming. Able to make his needs known. Pt is safe, compliant with medications. Will con't to monitor.

## 2022-09-01 NOTE — Progress Notes (Signed)
[facility] MD Progress Note  09/01/2022 12:24 PM Dylan Watts  MRN:  [id number] Subjective: Follow-up for this patient with major depression.  Patient reports he is feeling better.  Denies suicidal thoughts.  Feeling more upbeat and positive about the future.  Blood sugars are getting under better control as well. Principal Problem: MDD (major depressive disorder) Diagnosis: Principal Problem:   MDD (major depressive disorder) Active Problems:   Diabetes mellitus (HCC)   Self-inflicted laceration of left wrist (HCC)  Total Time spent with patient: 30 minutes  Past Psychiatric History: Past history of depression negativity self-injury diabetes poor self-care  Past Medical History:  Past Medical History:  Diagnosis Date   Diabetes mellitus without complication (HCC)    DKA (diabetic ketoacidoses) 04/2019    Past Surgical History:  Procedure Laterality Date   NO PAST SURGERIES     Family History:  Family History  Problem Relation Age of Onset   Diabetes Mother    Diabetes Father    Lupus Father    Diabetes Maternal Grandmother    Hypertension Maternal Grandmother    Diabetes Paternal Grandmother    Stroke Paternal Grandmother    Hypertension Paternal Grandmother    Family Psychiatric  History: See previous Social History:  Social History   Substance and Sexual Activity  Alcohol Use Yes   Comment: socially     Social History   Substance and Sexual Activity  Drug Use No    Social History   Socioeconomic History   Marital status: Single    Spouse name: Not on file   Number of children: Not on file   Years of education: Not on file   Highest education level: Not on file  Occupational History   Not on file  Tobacco Use   Smoking status: Never   Smokeless tobacco: Never  Vaping Use   Vaping Use: Never used  Substance and Sexual Activity   Alcohol use: Yes    Comment: socially   Drug use: No   Sexual activity: Not Currently  Other Topics Concern   Not on file   Social History Narrative   Not on file   Social Determinants of Health   Financial Resource Strain: Not on file  Food Insecurity: No Food Insecurity (08/29/2022)   Hunger Vital Sign    Worried About Running Out of Food in the Last Year: Never true    Ran Out of Food in the Last Year: Never true  Transportation Needs: No Transportation Needs (08/29/2022)   PRAPARE - Administrator, Civil Service (Medical): No    Lack of Transportation (Non-Medical): No  Physical Activity: Not on file  Stress: Not on file  Social Connections: Not on file   Additional Social History:                         Sleep: Fair  Appetite:  Fair  Current Medications: Current Facility-Administered Medications  Medication Dose Route Frequency Provider Last Rate Last Admin   acetaminophen (TYLENOL) tablet 650 mg  650 mg Oral Q6H PRN Lauree Chandler, NP   650 mg at 08/30/22 0822   alum & mag hydroxide-simeth (MAALOX/MYLANTA) 200-200-20 MG/5ML suspension 30 mL  30 mL Oral Q4H PRN Lauree Chandler, NP       atorvastatin (LIPITOR) tablet 20 mg  20 mg Oral Daily Lauree Chandler, NP   20 mg at 09/01/22 0825   diphenhydrAMINE (BENADRYL) capsule 50 mg  50 mg  Oral TID PRN Lauree Chandler, NP       Or   diphenhydrAMINE (BENADRYL) injection 50 mg  50 mg Intramuscular TID PRN Lauree Chandler, NP       FLUoxetine (PROZAC) capsule 20 mg  20 mg Oral Daily Britain Saber T, MD   20 mg at 09/01/22 0825   haloperidol (HALDOL) tablet 5 mg  5 mg Oral TID PRN Lauree Chandler, NP       Or   haloperidol lactate (HALDOL) injection 5 mg  5 mg Intramuscular TID PRN Lauree Chandler, NP       hydrOXYzine (ATARAX) tablet 25 mg  25 mg Oral TID PRN Lauree Chandler, NP       insulin aspart (novoLOG) injection 0-15 Units  0-15 Units Subcutaneous TID WC Lauree Chandler, NP   2 Units at 09/01/22 2440   insulin aspart (novoLOG) injection 0-5 Units  0-5 Units Subcutaneous QHS Lauree Chandler, NP   3 Units at 08/30/22 2110   insulin aspart (novoLOG) injection 10 Units  10 Units Subcutaneous TID WC Jawanda Passey, Jackquline Denmark, MD   10 Units at 09/01/22 0825   insulin glargine-yfgn (SEMGLEE) injection 40 Units  40 Units Subcutaneous QHS Khyrin Trevathan T, MD   40 Units at 08/31/22 2120   LORazepam (ATIVAN) tablet 2 mg  2 mg Oral TID PRN Lauree Chandler, NP       Or   LORazepam (ATIVAN) injection 2 mg  2 mg Intramuscular TID PRN Lauree Chandler, NP       magnesium hydroxide (MILK OF MAGNESIA) suspension 30 mL  30 mL Oral Daily PRN Lauree Chandler, NP       metFORMIN (GLUCOPHAGE) tablet 1,000 mg  1,000 mg Oral Q breakfast Lauree Chandler, NP   1,000 mg at 09/01/22 0824   metoprolol tartrate (LOPRESSOR) tablet 12.5 mg  12.5 mg Oral BID Lauree Chandler, NP   12.5 mg at 09/01/22 0824   traZODone (DESYREL) tablet 50 mg  50 mg Oral QHS PRN Lauree Chandler, NP   50 mg at 08/29/22 2132    Lab Results:  Results for orders placed or performed during the hospital encounter of 08/29/22 (from the past 48 hour(s))  Glucose, capillary     Status: Abnormal   Collection Time: 08/30/22  4:39 PM  Result Value Ref Range   Glucose-Capillary 208 (H) 70 - 99 mg/dL    Comment: Glucose reference range applies only to samples taken after fasting for at least 8 hours.   Comment 1 Notify RN   Glucose, capillary     Status: Abnormal   Collection Time: 08/30/22  7:48 PM  Result Value Ref Range   Glucose-Capillary 257 (H) 70 - 99 mg/dL    Comment: Glucose reference range applies only to samples taken after fasting for at least 8 hours.  Glucose, capillary     Status: Abnormal   Collection Time: 08/31/22  6:53 AM  Result Value Ref Range   Glucose-Capillary 183 (H) 70 - 99 mg/dL    Comment: Glucose reference range applies only to samples taken after fasting for at least 8 hours.  Glucose, capillary     Status: Abnormal   Collection Time: 08/31/22 11:46 AM  Result Value Ref Range    Glucose-Capillary 206 (H) 70 - 99 mg/dL    Comment: Glucose reference range applies only to samples taken after fasting for at least 8 hours.  Glucose, capillary  Status: Abnormal   Collection Time: 08/31/22  4:40 PM  Result Value Ref Range   Glucose-Capillary 110 (H) 70 - 99 mg/dL    Comment: Glucose reference range applies only to samples taken after fasting for at least 8 hours.  Glucose, capillary     Status: Abnormal   Collection Time: 08/31/22  8:23 PM  Result Value Ref Range   Glucose-Capillary 189 (H) 70 - 99 mg/dL    Comment: Glucose reference range applies only to samples taken after fasting for at least 8 hours.  Glucose, capillary     Status: Abnormal   Collection Time: 09/01/22  7:56 AM  Result Value Ref Range   Glucose-Capillary 144 (H) 70 - 99 mg/dL    Comment: Glucose reference range applies only to samples taken after fasting for at least 8 hours.  Glucose, capillary     Status: Abnormal   Collection Time: 09/01/22 12:10 PM  Result Value Ref Range   Glucose-Capillary 114 (H) 70 - 99 mg/dL    Comment: Glucose reference range applies only to samples taken after fasting for at least 8 hours.    Blood Alcohol level:  Lab Results  Component Value Date   ETH 129 (H) 08/29/2022    Metabolic Disorder Labs: Lab Results  Component Value Date   HGBA1C 13.1 (H) 08/29/2022   MPG 329.27 08/29/2022   MPG 380.93 05/01/2019   No results found for: "PROLACTIN" Lab Results  Component Value Date   CHOL 140 01/22/2019   TRIG 84 01/22/2019   HDL 33 (L) 01/22/2019   CHOLHDL 4.2 01/22/2019   LDLCALC 91 01/22/2019    Physical Findings: AIMS: Facial and Oral Movements Muscles of Facial Expression: None, normal Lips and Perioral Area: None, normal Jaw: None, normal Tongue: None, normal,Extremity Movements Upper (arms, wrists, hands, fingers): None, normal Lower (legs, knees, ankles, toes): None, normal, Trunk Movements Neck, shoulders, hips: None, normal, Overall  Severity Severity of abnormal movements (highest score from questions above): None, normal Incapacitation due to abnormal movements: None, normal Patient's awareness of abnormal movements (rate only patient's report): No Awareness, Dental Status Current problems with teeth and/or dentures?: No Does patient usually wear dentures?: No  CIWA:    COWS:     Musculoskeletal: Strength & Muscle Tone: within normal limits Gait & Station: normal Patient leans: N/A  Psychiatric Specialty Exam:  Presentation  General Appearance:  Appropriate for Environment; Other (comment) (dressed in hospital scrubs)  Eye Contact: Fair  Speech: Clear and Coherent; Slow  Speech Volume: Decreased  Handedness: Right   Mood and Affect  Mood: Depressed; Dysphoric  Affect: Flat; Tearful   Thought Process  Thought Processes: Coherent; Goal Directed; Linear  Descriptions of Associations:Intact  Orientation:Full (Time, Place and Person)  Thought Content:Logical  History of Schizophrenia/Schizoaffective disorder:No  Duration of Psychotic Symptoms:No data recorded Hallucinations:No data recorded Ideas of Reference:None  Suicidal Thoughts:No data recorded Homicidal Thoughts:No data recorded  Sensorium  Memory: Immediate Good; Recent Good; Remote Good  Judgment: Intact  Insight: Fair   Art therapist  Concentration: Fair  Attention Span: Fair  Recall: Jennelle Human of Knowledge: Fair  Language: Fair   Psychomotor Activity  Psychomotor Activity:No data recorded  Assets  Assets: Manufacturing systems engineer; Desire for Improvement; Housing; Resilience; Social Support   Sleep  Sleep:No data recorded   Physical Exam: Physical Exam Vitals and nursing note reviewed.  Constitutional:      Appearance: Normal appearance.  HENT:     Head: Normocephalic and atraumatic.  Mouth/Throat:     Pharynx: Oropharynx is clear.  Eyes:     Pupils: Pupils are equal, round,  and reactive to light.  Cardiovascular:     Rate and Rhythm: Normal rate and regular rhythm.  Pulmonary:     Effort: Pulmonary effort is normal.     Breath sounds: Normal breath sounds.  Abdominal:     General: Abdomen is flat.     Palpations: Abdomen is soft.  Musculoskeletal:        General: Normal range of motion.  Skin:    General: Skin is warm and dry.  Neurological:     General: No focal deficit present.     Mental Status: He is alert. Mental status is at baseline.  Psychiatric:        Attention and Perception: Attention normal.        Mood and Affect: Mood normal.        Speech: Speech normal.        Behavior: Behavior normal.        Thought Content: Thought content normal.        Cognition and Memory: Cognition normal.    Review of Systems  Constitutional: Negative.   HENT: Negative.    Eyes: Negative.   Respiratory: Negative.    Cardiovascular: Negative.   Gastrointestinal: Negative.   Musculoskeletal: Negative.   Skin: Negative.   Neurological: Negative.   Psychiatric/Behavioral: Negative.     Blood pressure (!) 141/75, pulse 90, temperature 98 F (36.7 C), temperature source Oral, resp. rate 18, height 6\' 1"  (1.854 m), weight (!) 138.3 kg, SpO2 98 %. Body mass index is 40.24 kg/m.   Treatment Plan Summary: Medication management and Plan no change to medication management.  Supportive counseling to the patient and encouragement review plan especially for outpatient treatment.  No change to current diabetes regimen.  I think that we will hope that we may be able to discharge him on Monday.  Mordecai Rasmussen, MD 09/01/2022, 12:24 PM

## 2022-09-01 NOTE — Group Note (Signed)
LCSW Group Therapy Note  Group Date: 09/01/2022 Start Time: 1307 End Time: 1412   Type of Therapy and Topic:  Group Therapy - Self-Care VS. Coping Skills  Participation Level:  Active   Description of Group The focus of this group was to determine what unhealthy coping techniques typically are used by group members and what healthy coping techniques would be helpful in coping with various problems. Patients were guided in becoming aware of the differences between healthy and unhealthy coping techniques. Patients were asked to identify 2-3 healthy coping skills they would like to learn to use more effectively.  Therapeutic Goals Patients learned that coping is what human beings do all day long to deal with various situations in their lives Patients defined and discussed healthy vs unhealthy coping techniques Patients identified their preferred coping techniques and identified whether these were healthy or unhealthy Patients determined 2-3 healthy coping skills they would like to become more familiar with and use more often. Patients provided support and ideas to each other   Summary of Patient Progress:  Pt. Attended group. Patient was an enthusiastic participant stating he previously used drinking to cope and he needed to work on stopping to think before acting.  Therapeutic Modalities Cognitive Behavioral Therapy Motivational Interviewing  Tama Headings 09/01/2022  4:46 PM

## 2022-09-01 NOTE — [facility] Group Notes (Signed)
[facility] Group Notes:  (Nursing/MHT/Case Management/Adjunct)  Date:  09/01/2022  Time:  2:35 PM  Type of Therapy:  activity  Participation Level:  Did Not Attend  Participation Quality:  na  Affect:  na  Cognitive:  na  Insight:  None  Engagement in Group:  na  Modes of Intervention:  na  Summary of Progress/Problems:  Courtyard Recreation Activity. Pt did not attend.   Dylan Watts 09/01/2022, 2:35 PM 

## 2022-09-01 NOTE — [facility] Group Notes (Signed)
[facility] Group Notes:  (Nursing/MHT/Case Management/Adjunct)  Date:  09/01/2022  Time:  10:46 AM  Type of Therapy:  Nurse Education/Psychoeducational skills  Participation Level:  Active  Participation Quality:  Appropriate  Affect:  Appropriate  Cognitive:  Appropriate  Insight:  Appropriate  Engagement in Group:  Engaged  Modes of Intervention:  Discussion, Education, and Exploration  Summary of Progress/Problems: Pts given podcast to listen to regarding healthy lifestyle and how this impacts overall well being. Pt attended and engaged in discussion.  Malva Limes 09/01/2022, 10:46 AM

## 2022-09-02 ENCOUNTER — Other Ambulatory Visit: Payer: Self-pay

## 2022-09-02 DIAGNOSIS — F332 Major depressive disorder, recurrent severe without psychotic features: Secondary | ICD-10-CM | POA: Diagnosis not present

## 2022-09-02 LAB — GLUCOSE, CAPILLARY
Glucose-Capillary: 134 mg/dL — ABNORMAL HIGH (ref 70–99)
Glucose-Capillary: 112 mg/dL — ABNORMAL HIGH (ref 70–99)
Glucose-Capillary: 149 mg/dL — ABNORMAL HIGH (ref 70–99)

## 2022-09-02 NOTE — Group Note (Signed)
Date:  09/02/2022 Time:  9:15 PM  Group Topic/Focus:  Building Self Esteem:   The Focus of this group is helping patients become aware of the effects of self-esteem on their lives, the things they and others do that enhance or undermine their self-esteem, seeing the relationship between their level of self-esteem and the choices they make and learning ways to enhance self-esteem.    Participation Level:  Active  Participation Quality:  Appropriate  Affect:  Appropriate  Cognitive:  Alert and Appropriate  Insight: Appropriate and Good  Engagement in Group:  Developing/Improving  Modes of Intervention:  Support  Additional Comments:     Dallie Patton 09/02/2022, 9:15 PM

## 2022-09-02 NOTE — Progress Notes (Signed)
The patient watched movies and socialized before bedtime.  He accepted his medications as ordered and denied thoughts of harming himself.  Dylan Watts was pleasant and cooperative.  His mood was anxious and sad.  BG's have resulted between 114-142 for the past 24 hours.  Overall improvements endorsed.    [name] fell asleep before 2200.  No significant changes to note.

## 2022-09-02 NOTE — Plan of Care (Signed)
PT denies SI / HI /AVH. Pt is adherent with scheduled medications. No signs of distress or injury. Pt is out in milieu socializing most of the day. No signs of distress or injury. Staff will continue to monitor q15.     Problem: Education: Goal: Knowledge of General Education information will improve Description: Including pain rating scale, medication(s)/side effects and non-pharmacologic comfort measures Outcome: Progressing   Problem: Health Behavior/Discharge Planning: Goal: Ability to manage health-related needs will improve Outcome: Progressing   Problem: Clinical Measurements: Goal: Ability to maintain clinical measurements within normal limits will improve Outcome: Progressing

## 2022-09-02 NOTE — Progress Notes (Signed)
The patient was visible in the milieu and social with peers.  He provided evidence of future orientation, stating that he planned to remain positive and focus on self-care following his discharge.  [place] accepted his medications as ordered and his blood glucose levels have stabilized.  He denied suicidal ideation.  Discharge planned for 09/03/22.

## 2022-09-02 NOTE — Progress Notes (Signed)
[facility] MD Progress Note  09/02/2022 12:44 PM Dylan Watts  MRN:  [id number] Subjective: Follow-up 32 year old man with depression.  Mood improved.  Cuts on his arm looking better.  No suicidal thought.  Upbeat affect. Principal Problem: MDD (major depressive disorder) Diagnosis: Principal Problem:   MDD (major depressive disorder) Active Problems:   Diabetes mellitus (HCC)   Self-inflicted laceration of left wrist (HCC)  Total Time spent with patient: 30 minutes  Past Psychiatric History: Past history of depression  Past Medical History:  Past Medical History:  Diagnosis Date   Diabetes mellitus without complication (HCC)    DKA (diabetic ketoacidoses) 04/2019    Past Surgical History:  Procedure Laterality Date   NO PAST SURGERIES     Family History:  Family History  Problem Relation Age of Onset   Diabetes Mother    Diabetes Father    Lupus Father    Diabetes Maternal Grandmother    Hypertension Maternal Grandmother    Diabetes Paternal Grandmother    Stroke Paternal Grandmother    Hypertension Paternal Grandmother    Family Psychiatric  History: See previous Social History:  Social History   Substance and Sexual Activity  Alcohol Use Yes   Comment: socially     Social History   Substance and Sexual Activity  Drug Use No    Social History   Socioeconomic History   Marital status: Single    Spouse name: Not on file   Number of children: Not on file   Years of education: Not on file   Highest education level: Not on file  Occupational History   Not on file  Tobacco Use   Smoking status: Never   Smokeless tobacco: Never  Vaping Use   Vaping Use: Never used  Substance and Sexual Activity   Alcohol use: Yes    Comment: socially   Drug use: No   Sexual activity: Not Currently  Other Topics Concern   Not on file  Social History Narrative   Not on file   Social Determinants of Health   Financial Resource Strain: Not on file  Food Insecurity: No  Food Insecurity (08/29/2022)   Hunger Vital Sign    Worried About Running Out of Food in the Last Year: Never true    Ran Out of Food in the Last Year: Never true  Transportation Needs: No Transportation Needs (08/29/2022)   PRAPARE - Administrator, Civil Service (Medical): No    Lack of Transportation (Non-Medical): No  Physical Activity: Not on file  Stress: Not on file  Social Connections: Not on file   Additional Social History:                         Sleep: Fair  Appetite:  Fair  Current Medications: Current Facility-Administered Medications  Medication Dose Route Frequency Provider Last Rate Last Admin   acetaminophen (TYLENOL) tablet 650 mg  650 mg Oral Q6H PRN Lauree Chandler, NP   650 mg at 08/30/22 0822   alum & mag hydroxide-simeth (MAALOX/MYLANTA) 200-200-20 MG/5ML suspension 30 mL  30 mL Oral Q4H PRN Lauree Chandler, NP       atorvastatin (LIPITOR) tablet 20 mg  20 mg Oral Daily Lauree Chandler, NP   20 mg at 09/02/22 0816   diphenhydrAMINE (BENADRYL) capsule 50 mg  50 mg Oral TID PRN Lauree Chandler, NP       Or   diphenhydrAMINE (BENADRYL) injection 50  mg  50 mg Intramuscular TID PRN Lauree Chandler, NP       FLUoxetine (PROZAC) capsule 20 mg  20 mg Oral Daily Dai Mcadams T, MD   20 mg at 09/02/22 0816   haloperidol (HALDOL) tablet 5 mg  5 mg Oral TID PRN Lauree Chandler, NP       Or   haloperidol lactate (HALDOL) injection 5 mg  5 mg Intramuscular TID PRN Lauree Chandler, NP       hydrOXYzine (ATARAX) tablet 25 mg  25 mg Oral TID PRN Lauree Chandler, NP       insulin aspart (novoLOG) injection 0-15 Units  0-15 Units Subcutaneous TID WC Lauree Chandler, NP   2 Units at 09/01/22 0981   insulin aspart (novoLOG) injection 0-5 Units  0-5 Units Subcutaneous QHS Lauree Chandler, NP   3 Units at 08/30/22 2110   insulin aspart (novoLOG) injection 10 Units  10 Units Subcutaneous TID WC Warnell Rasnic, Jackquline Denmark, MD   10  Units at 09/02/22 0835   insulin glargine-yfgn (SEMGLEE) injection 40 Units  40 Units Subcutaneous QHS Marykathleen Russi T, MD   40 Units at 09/01/22 2130   LORazepam (ATIVAN) tablet 2 mg  2 mg Oral TID PRN Lauree Chandler, NP       Or   LORazepam (ATIVAN) injection 2 mg  2 mg Intramuscular TID PRN Lauree Chandler, NP       magnesium hydroxide (MILK OF MAGNESIA) suspension 30 mL  30 mL Oral Daily PRN Lauree Chandler, NP       metFORMIN (GLUCOPHAGE) tablet 1,000 mg  1,000 mg Oral Q breakfast Lauree Chandler, NP   1,000 mg at 09/02/22 0816   metoprolol tartrate (LOPRESSOR) tablet 12.5 mg  12.5 mg Oral BID Lauree Chandler, NP   12.5 mg at 09/02/22 0816   traZODone (DESYREL) tablet 50 mg  50 mg Oral QHS PRN Lauree Chandler, NP   50 mg at 08/29/22 2132    Lab Results:  Results for orders placed or performed during the hospital encounter of 08/29/22 (from the past 48 hour(s))  Glucose, capillary     Status: Abnormal   Collection Time: 08/31/22  4:40 PM  Result Value Ref Range   Glucose-Capillary 110 (H) 70 - 99 mg/dL    Comment: Glucose reference range applies only to samples taken after fasting for at least 8 hours.  Glucose, capillary     Status: Abnormal   Collection Time: 08/31/22  8:23 PM  Result Value Ref Range   Glucose-Capillary 189 (H) 70 - 99 mg/dL    Comment: Glucose reference range applies only to samples taken after fasting for at least 8 hours.  Glucose, capillary     Status: Abnormal   Collection Time: 09/01/22  7:56 AM  Result Value Ref Range   Glucose-Capillary 144 (H) 70 - 99 mg/dL    Comment: Glucose reference range applies only to samples taken after fasting for at least 8 hours.  Glucose, capillary     Status: Abnormal   Collection Time: 09/01/22 12:10 PM  Result Value Ref Range   Glucose-Capillary 114 (H) 70 - 99 mg/dL    Comment: Glucose reference range applies only to samples taken after fasting for at least 8 hours.  Glucose, capillary      Status: Abnormal   Collection Time: 09/01/22  4:28 PM  Result Value Ref Range   Glucose-Capillary 124 (H) 70 - 99 mg/dL  Comment: Glucose reference range applies only to samples taken after fasting for at least 8 hours.  Glucose, capillary     Status: Abnormal   Collection Time: 09/01/22  9:28 PM  Result Value Ref Range   Glucose-Capillary 142 (H) 70 - 99 mg/dL    Comment: Glucose reference range applies only to samples taken after fasting for at least 8 hours.  Glucose, capillary     Status: Abnormal   Collection Time: 09/02/22 11:52 AM  Result Value Ref Range   Glucose-Capillary 134 (H) 70 - 99 mg/dL    Comment: Glucose reference range applies only to samples taken after fasting for at least 8 hours.    Blood Alcohol level:  Lab Results  Component Value Date   ETH 129 (H) 08/29/2022    Metabolic Disorder Labs: Lab Results  Component Value Date   HGBA1C 13.1 (H) 08/29/2022   MPG 329.27 08/29/2022   MPG 380.93 05/01/2019   No results found for: "PROLACTIN" Lab Results  Component Value Date   CHOL 140 01/22/2019   TRIG 84 01/22/2019   HDL 33 (L) 01/22/2019   CHOLHDL 4.2 01/22/2019   LDLCALC 91 01/22/2019    Physical Findings: AIMS: Facial and Oral Movements Muscles of Facial Expression: None, normal Lips and Perioral Area: None, normal Jaw: None, normal Tongue: None, normal,Extremity Movements Upper (arms, wrists, hands, fingers): None, normal Lower (legs, knees, ankles, toes): None, normal, Trunk Movements Neck, shoulders, hips: None, normal, Overall Severity Severity of abnormal movements (highest score from questions above): None, normal Incapacitation due to abnormal movements: None, normal Patient's awareness of abnormal movements (rate only patient's report): No Awareness, Dental Status Current problems with teeth and/or dentures?: No Does patient usually wear dentures?: No  CIWA:    COWS:     Musculoskeletal: Strength & Muscle Tone: within normal  limits Gait & Station: normal Patient leans: N/A  Psychiatric Specialty Exam:  Presentation  General Appearance:  Appropriate for Environment; Other (comment) (dressed in hospital scrubs)  Eye Contact: Fair  Speech: Clear and Coherent; Slow  Speech Volume: Decreased  Handedness: Right   Mood and Affect  Mood: Depressed; Dysphoric  Affect: Flat; Tearful   Thought Process  Thought Processes: Coherent; Goal Directed; Linear  Descriptions of Associations:Intact  Orientation:Full (Time, Place and Person)  Thought Content:Logical  History of Schizophrenia/Schizoaffective disorder:No  Duration of Psychotic Symptoms:No data recorded Hallucinations:No data recorded Ideas of Reference:None  Suicidal Thoughts:No data recorded Homicidal Thoughts:No data recorded  Sensorium  Memory: Immediate Good; Recent Good; Remote Good  Judgment: Intact  Insight: Fair   Art therapist  Concentration: Fair  Attention Span: Fair  Recall: Jennelle Human of Knowledge: Fair  Language: Fair   Psychomotor Activity  Psychomotor Activity:No data recorded  Assets  Assets: Manufacturing systems engineer; Desire for Improvement; Housing; Resilience; Social Support   Sleep  Sleep:No data recorded   Physical Exam: Physical Exam Vitals and nursing note reviewed.  Constitutional:      Appearance: Normal appearance.  HENT:     Head: Normocephalic and atraumatic.     Mouth/Throat:     Pharynx: Oropharynx is clear.  Eyes:     Pupils: Pupils are equal, round, and reactive to light.  Cardiovascular:     Rate and Rhythm: Normal rate and regular rhythm.  Pulmonary:     Effort: Pulmonary effort is normal.     Breath sounds: Normal breath sounds.  Abdominal:     General: Abdomen is flat.     Palpations: Abdomen is  soft.  Musculoskeletal:        General: Normal range of motion.  Skin:    General: Skin is warm and dry.  Neurological:     General: No focal deficit  present.     Mental Status: He is alert. Mental status is at baseline.  Psychiatric:        Attention and Perception: Attention normal.        Mood and Affect: Mood normal.        Speech: Speech normal.        Behavior: Behavior normal.        Thought Content: Thought content normal.        Cognition and Memory: Cognition normal.        Judgment: Judgment normal.    Review of Systems  Constitutional: Negative.   HENT: Negative.    Eyes: Negative.   Respiratory: Negative.    Cardiovascular: Negative.   Gastrointestinal: Negative.   Musculoskeletal: Negative.   Skin: Negative.   Neurological: Negative.   Psychiatric/Behavioral: Negative.     Blood pressure 112/74, pulse 83, temperature 97.6 F (36.4 C), temperature source Oral, resp. rate 18, height 6\' 1"  (1.854 m), weight (!) 138.3 kg, SpO2 99 %. Body mass index is 40.24 kg/m.   Treatment Plan Summary: Plan tentative plan for discharge tomorrow with recommended local follow-up.  Mordecai Rasmussen, MD 09/02/2022, 12:44 PM

## 2022-09-02 NOTE — Group Note (Signed)
Date:  09/02/2022 Time:  11:22 AM  Group Topic/Focus:  Activity Group  Due to the patients being on the unit majority of the day, I encourage the patients to go outside in the courtyard to get some fresh air and some exercise to promote wellbeing physically and mentally.      Participation Level:  Active  Participation Quality:  Appropriate  Affect:  Appropriate  Cognitive:  Appropriate  Insight: Appropriate  Engagement in Group:  Engaged  Modes of Intervention:  Activity  Additional Comments:    Dylan Watts 09/02/2022, 11:22 AM

## 2022-09-03 ENCOUNTER — Other Ambulatory Visit: Payer: Self-pay

## 2022-09-03 DIAGNOSIS — F332 Major depressive disorder, recurrent severe without psychotic features: Secondary | ICD-10-CM | POA: Diagnosis not present

## 2022-09-03 LAB — GLUCOSE, CAPILLARY
Glucose-Capillary: 100 mg/dL — ABNORMAL HIGH (ref 70–99)
Glucose-Capillary: 97 mg/dL (ref 70–99)

## 2022-09-03 NOTE — [facility] Suicide Risk Assessment (Signed)
[facility] INPATIENT:  Family/Significant Other Suicide Prevention Education  Suicide Prevention Education:  Patient Refusal for Family/Significant Other Suicide Prevention Education: The patient Dylan Watts has refused to provide written consent for family/significant other to be provided Family/Significant Other Suicide Prevention Education during admission and/or prior to discharge.  Physician notified.  SPE completed with pt, as pt refused to consent to family contact. SPI pamphlet provided to pt and pt was encouraged to share information with support network, ask questions, and talk about any concerns relating to SPE. Pt denies access to guns/firearms and verbalized understanding of information provided. Mobile Crisis information also provided to pt.  Glenis Smoker 09/03/2022, 4:49 PM
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
## 2022-09-03 NOTE — Progress Notes (Signed)
D- Patient alert and oriented. Patient presents in a pleasant mood on assessment reporting that he slept "good" last night and had no complaints to voice to this Clinical research associate. Patient continues to endorse some depression, hopelessness, and anxiety on his self-inventory, but states that he is feeling better. Patient denies SI, HI, AVH, and pain at this time. Patient's goal for today is to "plan for release", in which he will "make some calls", in order to achieve his goal.  A- Scheduled medications administered to patient, per MD orders. Support and encouragement provided. Routine safety checks conducted every 15 minutes. Patient informed to notify staff with problems or concerns.  R- No adverse drug reactions noted. Patient contracts for safety at this time. Patient compliant with medications and treatment plan. Patient receptive, calm, and cooperative. Patient interacts well with others on the unit.  Patient remains safe at this time.

## 2022-09-03 NOTE — Plan of Care (Signed)
Problem: Education: Goal: Knowledge of General Education information will improve Description: Including pain rating scale, medication(s)/side effects and non-pharmacologic comfort measures Outcome: Adequate for Discharge   Problem: Health Behavior/Discharge Planning: Goal: Ability to manage health-related needs will improve Outcome: Adequate for Discharge   Problem: Clinical Measurements: Goal: Ability to maintain clinical measurements within normal limits will improve Outcome: Adequate for Discharge Goal: Will remain free from infection Outcome: Adequate for Discharge Goal: Diagnostic test results will improve Outcome: Adequate for Discharge Goal: Respiratory complications will improve Outcome: Adequate for Discharge Goal: Cardiovascular complication will be avoided Outcome: Adequate for Discharge   Problem: Activity: Goal: Risk for activity intolerance will decrease Outcome: Adequate for Discharge   Problem: Nutrition: Goal: Adequate nutrition will be maintained Outcome: Adequate for Discharge   Problem: Coping: Goal: Level of anxiety will decrease Outcome: Adequate for Discharge   Problem: Elimination: Goal: Will not experience complications related to bowel motility Outcome: Adequate for Discharge Goal: Will not experience complications related to urinary retention Outcome: Adequate for Discharge   Problem: Pain Managment: Goal: General experience of comfort will improve Outcome: Adequate for Discharge   Problem: Safety: Goal: Ability to remain free from injury will improve Outcome: Adequate for Discharge   Problem: Skin Integrity: Goal: Risk for impaired skin integrity will decrease Outcome: Adequate for Discharge   Problem: Coping: Goal: Coping ability will improve Outcome: Adequate for Discharge   Problem: Medication: Goal: Compliance with prescribed medication regimen will improve Outcome: Adequate for Discharge   Problem: Education: Goal:  Knowledge of [place] General Education information/materials will improve Outcome: Adequate for Discharge Goal: Emotional status will improve Outcome: Adequate for Discharge Goal: Mental status will improve Outcome: Adequate for Discharge Goal: Verbalization of understanding the information provided will improve Outcome: Adequate for Discharge   Problem: Activity: Goal: Interest or engagement in activities will improve Outcome: Adequate for Discharge Goal: Sleeping patterns will improve Outcome: Adequate for Discharge   Problem: Coping: Goal: Ability to verbalize frustrations and anger appropriately will improve Outcome: Adequate for Discharge Goal: Ability to demonstrate self-control will improve Outcome: Adequate for Discharge   Problem: Health Behavior/Discharge Planning: Goal: Identification of resources available to assist in meeting health care needs will improve Outcome: Adequate for Discharge Goal: Compliance with treatment plan for underlying cause of condition will improve Outcome: Adequate for Discharge   Problem: Physical Regulation: Goal: Ability to maintain clinical measurements within normal limits will improve Outcome: Adequate for Discharge   Problem: Safety: Goal: Periods of time without injury will increase Outcome: Adequate for Discharge   Problem: Education: Goal: Utilization of techniques to improve thought processes will improve Outcome: Adequate for Discharge Goal: Knowledge of the prescribed therapeutic regimen will improve Outcome: Adequate for Discharge   Problem: Activity: Goal: Interest or engagement in leisure activities will improve Outcome: Adequate for Discharge Goal: Imbalance in normal sleep/wake cycle will improve Outcome: Adequate for Discharge   Problem: Coping: Goal: Coping ability will improve Outcome: Adequate for Discharge Goal: Will verbalize feelings Outcome: Adequate for Discharge   Problem: Health  Behavior/Discharge Planning: Goal: Ability to make decisions will improve Outcome: Adequate for Discharge Goal: Compliance with therapeutic regimen will improve Outcome: Adequate for Discharge   Problem: Role Relationship: Goal: Will demonstrate positive changes in social behaviors and relationships Outcome: Adequate for Discharge   Problem: Safety: Goal: Ability to disclose and discuss suicidal ideas will improve Outcome: Adequate for Discharge Goal: Ability to identify and utilize support systems that promote safety will improve Outcome: Adequate for Discharge  Problem: Self-Concept: Goal: Will verbalize positive feelings about self Outcome: Adequate for Discharge Goal: Level of anxiety will decrease Outcome: Adequate for Discharge

## 2022-09-03 NOTE — Discharge Summary (Signed)
Physician Discharge Summary Note  Patient:  Dylan Watts is an 32 y.o., male MRN:  [id number] DOB:  1990-09-21 Patient phone:  [phone] (home)  Patient address:   [address] [place] [address],  Total Time spent with patient: 30 minutes  Date of Admission:  08/29/2022 Date of Discharge: 09/03/2022  Reason for Admission: Patient was admitted after presenting with a self-inflicted cut on his arm and symptoms of depression.  Principal Problem: MDD (major depressive disorder) Discharge Diagnoses: Principal Problem:   MDD (major depressive disorder) Active Problems:   Diabetes mellitus (HCC)   Self-inflicted laceration of left wrist (HCC)   Past Psychiatric History: Patient has a past history of depressive symptoms.  Diabetes.  Past Medical History:  Past Medical History:  Diagnosis Date   Diabetes mellitus without complication (HCC)    DKA (diabetic ketoacidoses) 04/2019    Past Surgical History:  Procedure Laterality Date   NO PAST SURGERIES     Family History:  Family History  Problem Relation Age of Onset   Diabetes Mother    Diabetes Father    Lupus Father    Diabetes Maternal Grandmother    Hypertension Maternal Grandmother    Diabetes Paternal Grandmother    Stroke Paternal Grandmother    Hypertension Paternal Grandmother    Family Psychiatric  History: See previous Social History:  Social History   Substance and Sexual Activity  Alcohol Use Yes   Comment: socially     Social History   Substance and Sexual Activity  Drug Use No    Social History   Socioeconomic History   Marital status: Single    Spouse name: Not on file   Number of children: Not on file   Years of education: Not on file   Highest education level: Not on file  Occupational History   Not on file  Tobacco Use   Smoking status: Never   Smokeless tobacco: Never  Vaping Use   Vaping Use: Never used  Substance and Sexual Activity   Alcohol use: Yes    Comment:  socially   Drug use: No   Sexual activity: Not Currently  Other Topics Concern   Not on file  Social History Narrative   Not on file   Social Determinants of Health   Financial Resource Strain: Not on file  Food Insecurity: No Food Insecurity (08/29/2022)   Hunger Vital Sign    Worried About Running Out of Food in the Last Year: Never true    Ran Out of Food in the Last Year: Never true  Transportation Needs: No Transportation Needs (08/29/2022)   PRAPARE - Administrator, Civil Service (Medical): No    Lack of Transportation (Non-Medical): No  Physical Activity: Not on file  Stress: Not on file  Social Connections: Not on file    Hospital Course: Admitted to psychiatric hospital.  15-minute checks continued.  Patient did not display any dangerous behavior on the unit.  He was cooperative with appropriate treatment and showed good insight.  Met with treatment team.  Patient started on antidepressant medicine which he has tolerated well.  The cuts on his arm of healed as expected.  At the time of discharge he reports that his mood is feeling much better.  Completely denies suicidal ideation.  Has met with representative from [facility] and is agreeable to the recommended plan for outpatient follow-up.  Physical Findings: AIMS: Facial and Oral Movements Muscles of Facial Expression: None, normal Lips and Perioral Area:  None, normal Jaw: None, normal Tongue: None, normal,Extremity Movements Upper (arms, wrists, hands, fingers): None, normal Lower (legs, knees, ankles, toes): None, normal, Trunk Movements Neck, shoulders, hips: None, normal, Overall Severity Severity of abnormal movements (highest score from questions above): None, normal Incapacitation due to abnormal movements: None, normal Patient's awareness of abnormal movements (rate only patient's report): No Awareness, Dental Status Current problems with teeth and/or dentures?: No Does patient usually wear dentures?: No   CIWA:    COWS:     Musculoskeletal: Strength & Muscle Tone: within normal limits Gait & Station: normal Patient leans: N/A   Psychiatric Specialty Exam:  Presentation  General Appearance:  Appropriate for Environment; Other (comment) (dressed in hospital scrubs)  Eye Contact: Fair  Speech: Clear and Coherent; Slow  Speech Volume: Decreased  Handedness: Right   Mood and Affect  Mood: Depressed; Dysphoric  Affect: Flat; Tearful   Thought Process  Thought Processes: Coherent; Goal Directed; Linear  Descriptions of Associations:Intact  Orientation:Full (Time, Place and Person)  Thought Content:Logical  History of Schizophrenia/Schizoaffective disorder:No  Duration of Psychotic Symptoms:No data recorded Hallucinations:No data recorded Ideas of Reference:None  Suicidal Thoughts:No data recorded Homicidal Thoughts:No data recorded  Sensorium  Memory: Immediate Good; Recent Good; Remote Good  Judgment: Intact  Insight: Fair   Art therapist  Concentration: Fair  Attention Span: Fair  Recall: Jennelle Human of Knowledge: Fair  Language: Fair   Psychomotor Activity  Psychomotor Activity:No data recorded  Assets  Assets: Manufacturing systems engineer; Desire for Improvement; Housing; Resilience; Social Support   Sleep  Sleep:No data recorded   Physical Exam: Physical Exam Vitals and nursing note reviewed.  Constitutional:      Appearance: Normal appearance.  HENT:     Head: Normocephalic and atraumatic.     Mouth/Throat:     Pharynx: Oropharynx is clear.  Eyes:     Pupils: Pupils are equal, round, and reactive to light.  Cardiovascular:     Rate and Rhythm: Normal rate and regular rhythm.  Pulmonary:     Effort: Pulmonary effort is normal.     Breath sounds: Normal breath sounds.  Abdominal:     General: Abdomen is flat.     Palpations: Abdomen is soft.  Musculoskeletal:        General: Normal range of motion.  Skin:     General: Skin is warm and dry.  Neurological:     General: No focal deficit present.     Mental Status: He is alert. Mental status is at baseline.  Psychiatric:        Attention and Perception: Attention normal.        Mood and Affect: Mood normal.        Speech: Speech normal.        Behavior: Behavior normal.        Thought Content: Thought content normal.        Cognition and Memory: Cognition normal.        Judgment: Judgment normal.    Review of Systems  Constitutional: Negative.   HENT: Negative.    Eyes: Negative.   Respiratory: Negative.    Cardiovascular: Negative.   Gastrointestinal: Negative.   Musculoskeletal: Negative.   Skin: Negative.   Neurological: Negative.   Psychiatric/Behavioral: Negative.     Blood pressure (!) 135/91, pulse 88, temperature 98.1 F (36.7 C), temperature source Oral, resp. rate 18, height 6\' 1"  (1.854 m), weight (!) 138.3 kg, SpO2 100 %. Body mass index is 40.24 kg/m.  Social History   Tobacco Use  Smoking Status Never  Smokeless Tobacco Never   Tobacco Cessation:  N/A, patient does not currently use tobacco products   Blood Alcohol level:  Lab Results  Component Value Date   ETH 129 (H) 08/29/2022    Metabolic Disorder Labs:  Lab Results  Component Value Date   HGBA1C 13.1 (H) 08/29/2022   MPG 329.27 08/29/2022   MPG 380.93 05/01/2019   No results found for: "PROLACTIN" Lab Results  Component Value Date   CHOL 140 01/22/2019   TRIG 84 01/22/2019   HDL 33 (L) 01/22/2019   CHOLHDL 4.2 01/22/2019   LDLCALC 91 01/22/2019    See Psychiatric Specialty Exam and Suicide Risk Assessment completed by Attending Physician prior to discharge.  Discharge destination:  Home  Is patient on multiple antipsychotic therapies at discharge:  No   Has Patient had three or more failed trials of antipsychotic monotherapy by history:  No  Recommended Plan for Multiple Antipsychotic Therapies: NA  Discharge Instructions      Diet - low sodium heart healthy   Complete by: As directed    Increase activity slowly   Complete by: As directed       Allergies as of 09/03/2022   No Known Allergies      Medication List     STOP taking these medications    multivitamin with minerals tablet   venlafaxine XR 75 MG 24 hr capsule Commonly known as: EFFEXOR-XR   vitamin E 180 MG (400 UNITS) capsule Generic drug: vitamin E       TAKE these medications      Indication  atorvastatin 20 MG tablet Commonly known as: LIPITOR Take 1 tablet (20 mg total) by mouth daily.  Indication: High Amount of Fats in the Blood   FLUoxetine 20 MG capsule Commonly known as: PROZAC Take 1 capsule (20 mg total) by mouth daily.  Indication: Depression   hydrOXYzine 25 MG tablet Commonly known as: ATARAX Take 1 tablet (25 mg total) by mouth 3 (three) times daily as needed for anxiety.  Indication: Feeling Anxious   insulin glargine-yfgn 100 UNIT/ML injection Commonly known as: SEMGLEE Inject 0.4 mLs (40 Units total) into the skin at bedtime.  Indication: Type 2 Diabetes   metFORMIN 1000 MG tablet Commonly known as: GLUCOPHAGE Take 1 tablet (1,000 mg total) by mouth daily with breakfast. What changed:  how much to take how to take this when to take this additional instructions  Indication: Type 2 Diabetes   metoprolol tartrate 25 MG tablet Commonly known as: LOPRESSOR Take 0.5 tablets (12.5 mg total) by mouth 2 (two) times daily.  Indication: High Blood Pressure Disorder   NovoLOG FlexPen 100 UNIT/ML FlexPen Generic drug: insulin aspart Inject 10 Units into the skin 3 (three) times daily with meals.  Indication: Type 2 Diabetes   traZODone 50 MG tablet Commonly known as: DESYREL Take 1 tablet (50 mg total) by mouth at bedtime as needed for sleep.  Indication: Trouble Sleeping         Follow-up recommendations:  Other:  Patient provided with prescriptions and supplies of medicine at discharge and  strongly encouraged to follow-up with medical and psychiatric care.  Education provided about alcohol abuse.  Patient agrees to plan.  Comments: See above  Signed: Mordecai Rasmussen, MD 09/03/2022, 11:15 AM

## 2022-09-03 NOTE — Progress Notes (Addendum)
Patient ID: Dylan Watts, male   DOB: 1990/05/19, 31 y.o.   MRN: [id number]  Discharge Note:  Patient denies SI/HI/AVH at this time. Discharge instructions, AVS, prescriptions, medication supply and transition record gone over with patient. Patient received a copy of his Suicide Safety Plan. Patient agrees to comply with medication management, follow-up visit, and outpatient therapy. Patient belongings returned to patient. Patient questions and concerns addressed and answered. Patient ambulatory off unit. Patient discharged to home with his Mother.

## 2022-09-03 NOTE — [facility] Suicide Risk Assessment (Signed)
[facility] Suicide Risk Assessment   Principal Problem: MDD (major depressive disorder) Discharge Diagnoses: Principal Problem:   MDD (major depressive disorder) Active Problems:   Diabetes mellitus (HCC)   Self-inflicted laceration of left wrist (HCC)   Total Time spent with patient: 30 minutes  Musculoskeletal: Strength & Muscle Tone: within normal limits Gait & Station: normal Patient leans: N/A  Psychiatric Specialty Exam  Presentation  General Appearance:  Appropriate for Environment; Other (comment) (dressed in hospital scrubs)  Eye Contact: Fair  Speech: Clear and Coherent; Slow  Speech Volume: Decreased  Handedness: Right   Mood and Affect  Mood: Depressed; Dysphoric  Duration of Depression Symptoms: Greater than two weeks  Affect: Flat; Tearful   Thought Process  Thought Processes: Coherent; Goal Directed; Linear  Descriptions of Associations:Intact  Orientation:Full (Time, Place and Person)  Thought Content:Logical  History of Schizophrenia/Schizoaffective disorder:No  Duration of Psychotic Symptoms:No data recorded Hallucinations:No data recorded Ideas of Reference:None  Suicidal Thoughts:No data recorded Homicidal Thoughts:No data recorded  Sensorium  Memory: Immediate Good; Recent Good; Remote Good  Judgment: Intact  Insight: Fair   Art therapist  Concentration: Fair  Attention Span: Fair  Recall: Jennelle Human of Knowledge: Fair  Language: Fair   Psychomotor Activity  Psychomotor Activity:No data recorded  Assets  Assets: Manufacturing systems engineer; Desire for Improvement; Housing; Resilience; Social Support   Sleep  Sleep:No data recorded  Physical Exam: Physical Exam Vitals and nursing note reviewed.  Constitutional:      Appearance: Normal appearance.  HENT:     Head: Normocephalic and atraumatic.     Mouth/Throat:     Pharynx: Oropharynx is clear.  Eyes:     Pupils: Pupils are equal,  round, and reactive to light.  Cardiovascular:     Rate and Rhythm: Normal rate and regular rhythm.  Pulmonary:     Effort: Pulmonary effort is normal.     Breath sounds: Normal breath sounds.  Abdominal:     General: Abdomen is flat.     Palpations: Abdomen is soft.  Musculoskeletal:        General: Normal range of motion.  Skin:    General: Skin is warm and dry.  Neurological:     General: No focal deficit present.     Mental Status: He is alert. Mental status is at baseline.  Psychiatric:        Attention and Perception: Attention normal.        Mood and Affect: Mood normal.        Speech: Speech normal.        Behavior: Behavior normal.        Thought Content: Thought content normal.        Cognition and Memory: Cognition normal.        Judgment: Judgment normal.    Review of Systems  Constitutional: Negative.   HENT: Negative.    Eyes: Negative.   Respiratory: Negative.    Cardiovascular: Negative.   Gastrointestinal: Negative.   Musculoskeletal: Negative.   Skin: Negative.   Neurological: Negative.   Psychiatric/Behavioral: Negative.     Blood pressure (!) 135/91, pulse 88, temperature 98.1 F (36.7 C), temperature source Oral, resp. rate 18, height 6\' 1"  (1.854 m), weight (!) 138.3 kg, SpO2 100 %. Body mass index is 40.24 kg/m.  Mental Status Per Nursing Assessment::   On Admission:  Suicidal ideation indicated by patient  Demographic Factors:  Male and Low socioeconomic status  Loss Factors: Financial problems/change in socioeconomic status  Historical Factors: Impulsivity  Risk Reduction Factors:   Positive social support and Positive coping skills or problem solving skills  Continued Clinical Symptoms:  Depression:   Comorbid alcohol abuse/dependence  Cognitive Features That Contribute To Risk:  None    Suicide Risk:  Minimal: No identifiable suicidal ideation.  Patients presenting with no risk factors but with morbid ruminations; may be  classified as minimal risk based on the severity of the depressive symptoms    Plan Of Care/Follow-up recommendations:  Other:  Recommend follow-up with [facility].  Counseling provided about alcohol use.  Prescriptions provided.  Patient reports no dangerous thinking and appears to be much improved at discharge.  Mordecai Rasmussen, MD 09/03/2022, 11:13 AM

## 2022-09-03 NOTE — Group Note (Signed)
Recreation Therapy Group Note   Group Topic:Goal Setting  Group Date: 09/03/2022 Start Time: 1000 End Time: 1100 Facilitators: Rosina Lowenstein, LRT, CTRS Location:  Craft Room  Group Description: Scientist, research (physical sciences). Patients were given many different magazines, a glue stick, markers, and a piece of cardstock paper. LRT and pts discussed the importance of having goals in life. LRT and pts discussed the difference between short-term and long-term goals, as well as what a SMART goal is. LRT encouraged pts to create a vision board, with images they picked and then cut out with safety scissors from the magazine, for themselves, that capture their short and long-term goals. LRT encouraged pts to show and explain their vision board to the group. LRT offered to laminate vision board once dry and complete.   Goal Area(s) Addressed:  Patient will gain knowledge of short vs. long term goals.  Patient will identify goals for themselves. Patient will practice setting SMART goals. Patient will verbalize their goals to LRT and peers.  Affect/Mood: Appropriate   Participation Level: Active and Engaged   Participation Quality: Independent   Behavior: Calm and Cooperative   Speech/Thought Process: Coherent   Insight: Good   Judgement: Good   Modes of Intervention: Art   Patient Response to Interventions:  Attentive, Engaged, Interested , and Receptive   Education Outcome:  Acknowledges education   Clinical Observations/Individualized Feedback: Dylan Watts was active in their participation of session activities and group discussion. Pt identified "I want a house, to go on vacation and travel the world" as his goals. Pt appropriately cut out images for identified goals. Pt interacted well with LRT and peers duration of session.   Plan: Continue to engage patient in RT group sessions 2-3x/week.   Rosina Lowenstein, LRT, CTRS 09/03/2022 11:28 AM

## 2022-09-03 NOTE — Progress Notes (Signed)
  [facility] Adult Case Management Discharge Plan :  Will you be returning to the same living situation after discharge:  Yes,  pt returning home. At discharge, do you have transportation home?: Yes,  support system to assist with transportation.  Do you have the ability to pay for your medications: No.  Release of information consent forms completed and in the chart;  Patient's signature needed at discharge.  Patient to Follow up at:  Follow-up Information     [facility] Schedule an appointment as soon as possible for a visit.   Why: You will be meeting Lorella Nimrod, peer support specialist, at [facility] on Wednesday, 09/05/22 at 7:15AM. Thanks! Contact information: [address] [place] [address] [phone]                 Next level of care provider has access to [facility]:no  Safety Planning and Suicide Prevention discussed: Yes,  SPE completed with pt.     Has patient been referred to the Quitline?: Patient does not use tobacco/nicotine products  Patient has been referred for addiction treatment: No known substance use disorder.  Glenis Smoker, LCSW 09/03/2022, 4:49 PM

## 2022-09-04 ENCOUNTER — Other Ambulatory Visit: Payer: Self-pay

## 2022-09-04 MED ORDER — TECHLITE PEN NEEDLES 31G X 8 MM MISC
0 refills | Status: DC
  Filled 2022-09-04: qty 100, 25d supply, fill #0

## 2022-09-18 ENCOUNTER — Other Ambulatory Visit: Payer: Self-pay

## 2022-09-18 MED ORDER — FLUOXETINE HCL 20 MG PO CAPS
20.0000 mg | ORAL_CAPSULE | Freq: Every morning | ORAL | 1 refills | Status: DC
  Filled 2022-10-04: qty 30, 30d supply, fill #0

## 2022-09-18 MED ORDER — HYDROXYZINE HCL 25 MG PO TABS
25.0000 mg | ORAL_TABLET | Freq: Two times a day (BID) | ORAL | 1 refills | Status: DC | PRN
  Filled 2022-10-04: qty 60, 30d supply, fill #0

## 2022-10-04 ENCOUNTER — Other Ambulatory Visit: Payer: Self-pay

## 2022-12-19 ENCOUNTER — Other Ambulatory Visit: Payer: Self-pay

## 2022-12-19 MED ORDER — HYDROXYZINE HCL 25 MG PO TABS
25.0000 mg | ORAL_TABLET | Freq: Two times a day (BID) | ORAL | 1 refills | Status: DC | PRN
  Filled 2022-12-19: qty 60, 30d supply, fill #0
  Filled 2023-03-18: qty 60, 30d supply, fill #1

## 2022-12-19 MED ORDER — FLUOXETINE HCL 20 MG PO CAPS
20.0000 mg | ORAL_CAPSULE | Freq: Every morning | ORAL | 1 refills | Status: DC
  Filled 2022-12-19: qty 30, 30d supply, fill #0
  Filled 2023-03-18: qty 30, 30d supply, fill #1

## 2023-03-18 ENCOUNTER — Other Ambulatory Visit: Payer: Self-pay

## 2023-05-29 ENCOUNTER — Ambulatory Visit
Admission: EM | Admit: 2023-05-29 | Discharge: 2023-05-29 | Disposition: A | Discharge status: Home / Self Care | Attending: Physician Assistant | Admitting: Physician Assistant

## 2023-05-29 DIAGNOSIS — J069 Acute upper respiratory infection, unspecified: Secondary | ICD-10-CM | POA: Diagnosis present

## 2023-05-29 LAB — POCT INFLUENZA A/B
Influenza A, POC: NEGATIVE
Influenza B, POC: NEGATIVE

## 2023-05-29 MED ORDER — FLUTICASONE PROPIONATE 50 MCG/ACT NA SUSP: 1.0000 | Freq: Every day | NASAL | 0 refills | Status: DC

## 2023-05-29 MED ORDER — PROMETHAZINE-DM 6.25-15 MG/5ML PO SYRP: 5.0000 mL | ORAL_SOLUTION | Freq: Three times a day (TID) | ORAL | 0 refills | Status: DC | PRN

## 2023-05-29 NOTE — ED Provider Notes (Signed)
 [facility]    CSN: [id number] Arrival date & time: 05/29/23  1033      History   Chief Complaint Chief Complaint  Patient presents with   Fever    HPI Dylan Watts is a 33 y.o. male.   Patient presents today with a 2-day history of URI symptoms.  He reports subjective fever, chills, cough, rhinorrhea.  Denies any chest pain, shortness of breath, nausea, vomiting, diarrhea.  He reports that both the flu virus and norovirus-going around his place of employment but denies additional sick contacts.  He has not had influenza vaccine.  He has not had COVID in the past.  He has tried ibuprofen and Mucinex without improvement of symptoms.  He denies any history of allergies, asthma, COPD, smoking.  Denies any recent antibiotics or steroids.    Past Medical History:  Diagnosis Date   Diabetes mellitus without complication (HCC)    DKA (diabetic ketoacidoses) 04/2019    Patient Active Problem List   Diagnosis Date Noted   Major depressive disorder 08/29/2022   MDD (major depressive disorder) 08/29/2022   Diabetes mellitus (HCC) 08/29/2022   Self-inflicted laceration of left wrist (HCC) 08/29/2022   DKA (diabetic ketoacidosis) (HCC) 04/30/2019   Hyperlipidemia 04/30/2019   Nausea & vomiting 04/30/2019   Hypertension 04/30/2019   Obesity, Class III, BMI 40-49.9 (morbid obesity) (HCC) 09/08/2017   Leukocytosis 09/08/2017   AKI (acute kidney injury) (HCC) 09/08/2017   Hypokalemia 09/08/2017   Hypophosphatemia 09/08/2017   Diabetic ketoacidosis (HCC) 09/07/2017    Past Surgical History:  Procedure Laterality Date   NO PAST SURGERIES         Home Medications    Prior to Admission medications   Medication Sig Start Date End Date Taking? Authorizing Provider  fluticasone (FLONASE) 50 MCG/ACT nasal spray Place 1 spray into both nostrils daily. 05/29/23  Yes Russel Morain K, PA-C  promethazine-dextromethorphan (PROMETHAZINE-DM) 6.25-15 MG/5ML syrup Take 5 mLs by  mouth 3 (three) times daily as needed for cough. 05/29/23  Yes Denya Buckingham K, PA-C  atorvastatin (LIPITOR) 20 MG tablet Take 1 tablet (20 mg total) by mouth daily. 09/01/22   Clapacs, Jackquline Denmark, MD  FLUoxetine (PROZAC) 20 MG capsule Take 1 capsule (20 mg total) by mouth every morning. 12/19/22     hydrOXYzine (ATARAX) 25 MG tablet Take 1 tablet (25 mg total) by mouth 2 (two) times daily as needed. 12/19/22     insulin lispro (HUMALOG KWIKPEN) 100 UNIT/ML KwikPen Inject 10 Units into the skin 3 (three) times daily with meals. 09/01/22   Clapacs, Jackquline Denmark, MD  Insulin Pen Needle (TECHLITE PEN NEEDLES) 31G X 8 MM MISC use with basglar and humalog 09/03/22   Clapacs, Jackquline Denmark, MD  metFORMIN (GLUCOPHAGE) 1000 MG tablet Take 1 tablet (1,000 mg total) by mouth daily with breakfast. 09/02/22   Clapacs, Jackquline Denmark, MD  metoprolol tartrate (LOPRESSOR) 25 MG tablet Take 0.5 tablets (12.5 mg total) by mouth 2 (two) times daily. 09/01/22   Clapacs, Jackquline Denmark, MD  traZODone (DESYREL) 50 MG tablet Take 1 tablet (50 mg total) by mouth at bedtime as needed for sleep. 09/01/22   Clapacs, Jackquline Denmark, MD  insulin aspart (NOVOLOG) 100 UNIT/ML injection Inject 10 Units into the skin 3 (three) times daily with meals. 09/01/22 09/01/22  Clapacs, Jackquline Denmark, MD    Family History Family History  Problem Relation Age of Onset   Diabetes Mother    Diabetes Father    Lupus Father  Diabetes Maternal Grandmother    Hypertension Maternal Grandmother    Diabetes Paternal Grandmother    Stroke Paternal Grandmother    Hypertension Paternal Grandmother     Social History Social History   Tobacco Use   Smoking status: Never   Smokeless tobacco: Never  Vaping Use   Vaping status: Never Used  Substance Use Topics   Alcohol use: Yes    Comment: socially   Drug use: No     Allergies   Patient has no known allergies.   Review of Systems Review of Systems  Constitutional:  Positive for activity change, chills, fatigue and fever. Negative for  appetite change.  HENT:  Positive for congestion. Negative for sinus pressure, sneezing and sore throat.   Respiratory:  Positive for cough. Negative for shortness of breath.   Cardiovascular:  Negative for chest pain.  Gastrointestinal:  Negative for abdominal pain, diarrhea, nausea and vomiting.  Musculoskeletal:  Positive for arthralgias and myalgias.  Neurological:  Positive for headaches. Negative for dizziness and light-headedness.     Physical Exam Triage Vital Signs ED Triage Vitals  Encounter Vitals Group     BP 05/29/23 1105 127/85     Systolic BP Percentile --      Diastolic BP Percentile --      Pulse --      Resp 05/29/23 1105 16     Temp 05/29/23 1105 98.6 F (37 C)     Temp Source 05/29/23 1105 Oral     SpO2 05/29/23 1105 95 %     Weight 05/29/23 1106 (!) 325 lb (147.4 kg)     Height 05/29/23 1106 6\' 1"  (1.854 m)     Head Circumference --      Peak Flow --      Pain Score 05/29/23 1108 0     Pain Loc --      Pain Education --      Exclude from Growth Chart --    No data found.  Updated Vital Signs BP 127/85 (BP Location: Right Arm)   Temp 98.6 F (37 C) (Oral)   Resp 16   Ht 6\' 1"  (1.854 m)   Wt (!) 325 lb (147.4 kg)   SpO2 95%   BMI 42.88 kg/m   Visual Acuity Right Eye Distance:   Left Eye Distance:   Bilateral Distance:    Right Eye Near:   Left Eye Near:    Bilateral Near:     Physical Exam Vitals reviewed.  Constitutional:      General: He is awake.     Appearance: Normal appearance. He is well-developed. He is not ill-appearing.     Comments: Very pleasant male appears stated age in no acute distress sitting comfortably in exam room  HENT:     Head: Normocephalic and atraumatic.     Right Ear: Tympanic membrane, ear canal and external ear normal. Tympanic membrane is not erythematous or bulging.     Left Ear: Tympanic membrane, ear canal and external ear normal. Tympanic membrane is not erythematous or bulging.     Nose: Nose normal.      Mouth/Throat:     Pharynx: Uvula midline. Posterior oropharyngeal erythema present. No oropharyngeal exudate or uvula swelling.  Cardiovascular:     Rate and Rhythm: Normal rate and regular rhythm.     Heart sounds: Normal heart sounds, S1 normal and S2 normal. No murmur heard. Pulmonary:     Effort: Pulmonary effort is normal. No accessory muscle usage  or respiratory distress.     Breath sounds: Normal breath sounds. No stridor. No wheezing, rhonchi or rales.     Comments: Clear to auscultation bilaterally Neurological:     Mental Status: He is alert.  Psychiatric:        Behavior: Behavior is cooperative.      UC Treatments / Results  Labs (all labs ordered are listed, but only abnormal results are displayed) Labs Reviewed  POCT INFLUENZA A/B - Normal  SARS CORONAVIRUS 2 (TAT 6-24 HRS)    EKG   Radiology No results found.  Procedures Procedures (including critical care time)  Medications Ordered in UC Medications - No data to display  Initial Impression / Assessment and Plan / UC Course  I have reviewed the triage vital signs and the nursing notes.  Pertinent labs & imaging results that were available during my care of the patient were reviewed by me and considered in my medical decision making (see chart for details).     Patient is well-appearing, afebrile, nontoxic.  No evidence of acute infection on physical exam that would warrant initiation of antibiotics.  He tested negative for influenza.  He was tested for COVID and these results are pending.  We will contact him if they are positive.  He does have a history of diabetes and so is a candidate for antiviral therapy.  His last metabolic panel obtained 08/29/2022 showed creatinine of 0.68 with EGFR greater than 60 mL/min.  He would need to hold his atorvastatin, trazodone, hydroxyzine while on this medication for 3 days after completing course.  He was treated symptomatically with Promethazine DM we discussed  that this can be sedating and he is not to drive or drink alcohol taking it.  He can use fluticasone nasal spray for additional symptom relief.  Recommended over-the-counter medication including Mucinex, Tylenol, nasal saline/sinus rinses.  He is to rest and drink plenty of fluid.  If his symptoms are not improving within a week he is to return for reevaluation.  Discussed that if anything worsens he needs to be seen immediately.  Strict return precautions given.  Excuse note provided.  Patient is candidate for antiviral therapy: Yes Medication recommendation: nirmatrelvir ritonavir eGFR: >60 Renal Dosing: No Medication adjustment while on antiviral therapy: Hold atorvastatin, hydroxyzine, trazodone while on Paxlovid and for 3 days after completing course.   Final Clinical Impressions(s) / UC Diagnoses   Final diagnoses:  Viral URI with cough     Discharge Instructions      You tested negative for flu.  We will contact you if you are positive for COVID.  Use Promethazine DM for cough.  This will make you sleepy so do not drive drink alcohol while taking it.  Use fluticasone nasal spray for congestion.  I also recommend nasal saline and sinus rinses as well as Tylenol and Mucinex.  If you are positive for COVID and are interested in taking Paxlovid, you would need to hold your atorvastatin, hydroxyzine, trazodone while on this medication and for 3 days after completing course.  If you are not feeling better within a week please return for reevaluation.  If anything worsens and you have high fever, worsening cough, shortness of breath, chest pain, nausea/vomiting interfering with oral intake you need to be seen immediately.     ED Prescriptions     Medication Sig Dispense Auth. Provider   promethazine-dextromethorphan (PROMETHAZINE-DM) 6.25-15 MG/5ML syrup Take 5 mLs by mouth 3 (three) times daily as needed for cough. 118  mL Agustus Mane K, PA-C   fluticasone (FLONASE) 50 MCG/ACT nasal  spray Place 1 spray into both nostrils daily. 16 g Sebastin Perlmutter K, PA-C      PDMP not reviewed this encounter.   Jeani Hawking, PA-C 05/29/23 1343

## 2023-05-29 NOTE — ED Triage Notes (Signed)
 Patient here today with c/o weakness, chills, sweats, runny nose, and little cough since Monday evening. He has been taking Mucinex and Advil with some relief. Coworkers have also been sick with the flu.

## 2023-05-29 NOTE — Discharge Instructions (Signed)
 You tested negative for flu.  We will contact you if you are positive for COVID.  Use Promethazine DM for cough.  This will make you sleepy so do not drive drink alcohol while taking it.  Use fluticasone nasal spray for congestion.  I also recommend nasal saline and sinus rinses as well as Tylenol and Mucinex.  If you are positive for COVID and are interested in taking Paxlovid, you would need to hold your atorvastatin, hydroxyzine, trazodone while on this medication and for 3 days after completing course.  If you are not feeling better within a week please return for reevaluation.  If anything worsens and you have high fever, worsening cough, shortness of breath, chest pain, nausea/vomiting interfering with oral intake you need to be seen immediately.

## 2023-05-30 ENCOUNTER — Telehealth (HOSPITAL_COMMUNITY): Payer: Self-pay

## 2023-05-30 LAB — SARS CORONAVIRUS 2 (TAT 6-24 HRS): SARS Coronavirus 2: POSITIVE — AB

## 2023-05-30 MED ORDER — PAXLOVID (300/100) 20 X 150 MG & 10 X 100MG PO TBPK: 3.0000 | ORAL_TABLET | Freq: Two times a day (BID) | ORAL | 0 refills | Status: DC

## 2023-05-30 MED ORDER — PAXLOVID (300/100) 20 X 150 MG & 10 X 100MG PO TBPK: 3.0000 | ORAL_TABLET | Freq: Two times a day (BID) | ORAL | 0 refills | Status: AC

## 2023-05-30 NOTE — Telephone Encounter (Signed)
 Per provider note, "Patient is candidate for antiviral therapy: Yes Medication recommendation: nirmatrelvir ritonavir eGFR: >60 Renal Dosing: No Medication adjustment while on antiviral therapy: Hold atorvastatin, hydroxyzine, trazodone while on Paxlovid and for 3 days after completing course"  Reviewed with patient, verified pharmacy, prescription sent

## 2023-05-30 NOTE — Addendum Note (Signed)
 Addended by: Warren Danes on: 05/30/2023 11:34 AM   Modules accepted: Orders

## 2023-06-03 ENCOUNTER — Other Ambulatory Visit: Payer: Self-pay

## 2023-07-10 ENCOUNTER — Encounter: Admitting: Family

## 2023-07-10 NOTE — Progress Notes (Signed)
 Erroneous encounter-disregard

## 2023-08-13 ENCOUNTER — Encounter: Payer: Self-pay | Admitting: Family

## 2023-08-13 ENCOUNTER — Ambulatory Visit (INDEPENDENT_AMBULATORY_CARE_PROVIDER_SITE_OTHER): Admitting: Family

## 2023-08-13 VITALS — BP 126/86 | HR 98 | Temp 98.3°F | Resp 18 | Ht 73.0 in | Wt 327.8 lb

## 2023-08-13 DIAGNOSIS — Z139 Encounter for screening, unspecified: Secondary | ICD-10-CM

## 2023-08-13 DIAGNOSIS — E1165 Type 2 diabetes mellitus with hyperglycemia: Secondary | ICD-10-CM | POA: Diagnosis not present

## 2023-08-13 DIAGNOSIS — F418 Other specified anxiety disorders: Secondary | ICD-10-CM

## 2023-08-13 DIAGNOSIS — Z794 Long term (current) use of insulin: Secondary | ICD-10-CM | POA: Diagnosis not present

## 2023-08-13 DIAGNOSIS — I10 Essential (primary) hypertension: Secondary | ICD-10-CM

## 2023-08-13 DIAGNOSIS — Z7984 Long term (current) use of oral hypoglycemic drugs: Secondary | ICD-10-CM | POA: Diagnosis not present

## 2023-08-13 DIAGNOSIS — E119 Type 2 diabetes mellitus without complications: Secondary | ICD-10-CM

## 2023-08-13 DIAGNOSIS — F419 Anxiety disorder, unspecified: Secondary | ICD-10-CM

## 2023-08-13 DIAGNOSIS — Z7689 Persons encountering health services in other specified circumstances: Secondary | ICD-10-CM

## 2023-08-13 DIAGNOSIS — E785 Hyperlipidemia, unspecified: Secondary | ICD-10-CM

## 2023-08-13 MED ORDER — ATORVASTATIN CALCIUM 20 MG PO TABS
20.0000 mg | ORAL_TABLET | Freq: Every day | ORAL | 0 refills | Status: DC
Start: 2023-08-13 — End: 2023-09-13

## 2023-08-13 MED ORDER — METOPROLOL TARTRATE 25 MG PO TABS
12.5000 mg | ORAL_TABLET | Freq: Two times a day (BID) | ORAL | 0 refills | Status: DC
Start: 2023-08-13 — End: 2023-10-14

## 2023-08-13 MED ORDER — TECHLITE PEN NEEDLES 31G X 8 MM MISC
1.0000 | Freq: Three times a day (TID) | 2 refills | Status: DC
Start: 2023-08-13 — End: 2024-01-31

## 2023-08-13 MED ORDER — INSULIN GLARGINE 100 UNIT/ML SOLOSTAR PEN
40.0000 [IU] | PEN_INJECTOR | Freq: Every day | SUBCUTANEOUS | 3 refills | Status: DC
Start: 2023-08-13 — End: 2023-12-03

## 2023-08-13 MED ORDER — FLUOXETINE HCL 20 MG PO CAPS
20.0000 mg | ORAL_CAPSULE | Freq: Every morning | ORAL | 1 refills | Status: DC
Start: 2023-08-13 — End: 2023-12-03

## 2023-08-13 MED ORDER — HYDROXYZINE HCL 25 MG PO TABS
25.0000 mg | ORAL_TABLET | Freq: Two times a day (BID) | ORAL | 1 refills | Status: DC | PRN
Start: 2023-08-13 — End: 2023-12-03

## 2023-08-13 MED ORDER — INSULIN LISPRO (1 UNIT DIAL) 100 UNIT/ML (KWIKPEN)
10.0000 [IU] | PEN_INJECTOR | Freq: Three times a day (TID) | SUBCUTANEOUS | 2 refills | Status: DC
Start: 2023-08-13 — End: 2023-12-03

## 2023-08-13 NOTE — Progress Notes (Signed)
 Needs to get back on medication and diabetes check up.  Patient scored high on PHQ9  patient had thoughts that he would be better off dead

## 2023-08-13 NOTE — Progress Notes (Signed)
 Subjective:    Dylan Watts - 33 y.o. male MRN [id number]  Date of birth: 1990/07/04  HPI  Dylan Watts is to establish care.   Current issues and/or concerns: - Hypertension. States he has not taken Metoprolol  Tartrate in several months due to moving from [place], [place] to [place], [place] and needing refills. He does not check blood pressure outside of office. He is trying to limit salt intake. He does not complain of red flag symptoms such as but not limited to chest pain, shortness of breath, worst headache of life, nausea/vomiting.  - Type 2 diabetes. States he has not taken short-acting insulin  (sliding scale) and long-acting insulin  (40 units) in several months due to moving from [place], [place] to [place], [place] and needing refills. Home blood sugars 230's to 240's. He is trying to watch what he eats. He denies red flag symptoms associated with diabetes.  - Due for diabetic eye exam.  - Due for diabetic foot exam. - High cholesterol. States he has not taken Atorvastatin  in several months due to moving from [place], [place] to [place], [place] and needing refills.  - Anxiety depression. States he has not taken Fluoxetine  and Hydroxyzine  in several months due to moving from [place], [place] to [place], [place] and needing refills. Today he denies thoughts of self-harm, suicidal ideations, homicidal ideations. States the anxiety depression questionnaire he completed today applied to "months ago in January".  - No further issues/concerns for discussion today.   ROS per HPI   Health Maintenance:  Health Maintenance Due  Topic Date Due   FOOT EXAM  Never done   OPHTHALMOLOGY EXAM  Never done   Diabetic kidney evaluation - Urine ACR  Never done   Hepatitis C Screening  Never done   HEMOGLOBIN A1C  02/28/2023   Diabetic kidney evaluation - eGFR measurement  08/29/2023     Past Medical History: Patient Active Problem List   Diagnosis Date Noted   Major depressive disorder 08/29/2022   MDD  (major depressive disorder) 08/29/2022   Diabetes mellitus (HCC) 08/29/2022   Self-inflicted laceration of left wrist (HCC) 08/29/2022   DKA (diabetic ketoacidosis) (HCC) 04/30/2019   Hyperlipidemia 04/30/2019   Nausea & vomiting 04/30/2019   Hypertension 04/30/2019   Obesity, Class III, BMI 40-49.9 (morbid obesity) 09/08/2017   Leukocytosis 09/08/2017   AKI (acute kidney injury) (HCC) 09/08/2017   Hypokalemia 09/08/2017   Hypophosphatemia 09/08/2017   Diabetic ketoacidosis (HCC) 09/07/2017      Social History   reports that he has never smoked. He has never used smokeless tobacco. He reports current alcohol use. He reports that he does not use drugs.   Family History  family history includes Diabetes in his father, maternal grandmother, mother, and paternal grandmother; Hypertension in his maternal grandmother and paternal grandmother; Lupus in his father; Stroke in his paternal grandmother.   Medications: reviewed and updated   Objective:   Physical Exam BP 126/86   Pulse 98   Temp 98.3 F (36.8 C) (Oral)   Resp 18   Ht 6\' 1"  (1.854 m)   Wt (!) 327 lb 12.8 oz (148.7 kg)   SpO2 96%   BMI 43.25 kg/m   Physical Exam HENT:     Head: Normocephalic and atraumatic.     Nose: Nose normal.     Mouth/Throat:     Mouth: Mucous membranes are moist.     Pharynx: Oropharynx is clear.  Eyes:     Extraocular Movements: Extraocular movements intact.  Conjunctiva/sclera: Conjunctivae normal.     Pupils: Pupils are equal, round, and reactive to light.  Cardiovascular:     Rate and Rhythm: Normal rate and regular rhythm.     Pulses: Normal pulses.     Heart sounds: Normal heart sounds.  Pulmonary:     Effort: Pulmonary effort is normal.     Breath sounds: Normal breath sounds.  Musculoskeletal:        General: Normal range of motion.     Cervical back: Normal range of motion and neck supple.  Neurological:     General: No focal deficit present.     Mental Status: He is  alert and oriented to person, place, and time.  Psychiatric:        Mood and Affect: Mood normal.        Behavior: Behavior normal.      Assessment & Plan:  1. Encounter to establish care (Primary) - Patient presents today to establish care. During the interim follow-up with primary provider as scheduled.  - Return for annual physical examination, labs, and health maintenance. Arrive fasting meaning having no food for at least 8 hours prior to appointment. You may have only water or black coffee. Please take scheduled medications as normal.  2. Primary hypertension - Continue Metoprolol  Tartrate as prescribed.  - Routine screening.  - Counseled on blood pressure goal of less than 130/80, low-sodium, DASH diet, medication compliance, and 150 minutes of moderate intensity exercise per week as tolerated. Counseled on medication adherence and adverse effects. - Follow-up with primary provider in 4 weeks or sooner if needed. - metoprolol  tartrate (LOPRESSOR ) 25 MG tablet; Take 0.5 tablets (12.5 mg total) by mouth 2 (two) times daily.  Dispense: 60 tablet; Refill: 0 - Basic Metabolic Panel  3. Type 2 diabetes mellitus with hyperglycemia, with long-term current use of insulin  (HCC) - Continue Insulin  Glargine and Insulin  Lispro as prescribed.  - Hemoglobin A1c result pending.  - Routine screening.  - Discussed the importance of healthy eating habits, low-carbohydrate diet, low-sugar diet, regular aerobic exercise (at least 150 minutes a week as tolerated) and medication compliance to achieve or maintain control of diabetes. Counseled on medication adherence/adverse effects.  - Follow-up with primary provider in 4 weeks or sooner if needed. - insulin  lispro (HUMALOG  KWIKPEN) 100 UNIT/ML KwikPen; Inject 10 Units into the skin 3 (three) times daily with meals.  Dispense: 15 mL; Refill: 2 - Insulin  Pen Needle (TECHLITE PEN NEEDLES) 31G X 8 MM MISC; 1 each by Other route 4 (four) times daily -  before  meals and at bedtime.  Dispense: 100 each; Refill: 2 - insulin  glargine (LANTUS ) 100 UNIT/ML Solostar Pen; Inject 40 Units into the skin daily.  Dispense: 15 mL; Refill: 3 - Hemoglobin A1c - Microalbumin / creatinine urine ratio  4. Diabetic eye exam [facility]) - Referral to Ophthalmology for evaluation/management. - Ambulatory referral to Ophthalmology  5. Encounter for diabetic foot exam [facility]) - Referral to Podiatry for evaluation/management. - Ambulatory referral to Podiatry  6. Hyperlipidemia, unspecified hyperlipidemia type - Practice low-fat heart healthy diet and at least 150 minutes of moderate intensity exercise weekly as tolerated.  - Continue Atorvastatin  as prescribed. Counseled on  medication adherence/adverse effects.  - Routine screening.  - Follow-up with primary provider as scheduled. - atorvastatin  (LIPITOR) 20 MG tablet; Take 1 tablet (20 mg total) by mouth daily.  Dispense: 30 tablet; Refill: 0 - Lipid panel  7. Anxiety and depression - Patient denies thoughts of self-harm,  suicidal ideations, homicidal ideations. - Continue Fluoxetine  and Hydroxyzine  as prescribed. Counseled on medication adherence/adverse effects.  - Follow-up with primary provider in 4 weeks or sooner if needed. - FLUoxetine  (PROZAC ) 20 MG capsule; Take 1 capsule (20 mg total) by mouth every morning.  Dispense: 30 capsule; Refill: 1 - hydrOXYzine  (ATARAX ) 25 MG tablet; Take 1 tablet (25 mg total) by mouth 2 (two) times daily as needed.  Dispense: 60 tablet; Refill: 1  8. Encounter for screening involving social determinants of health (SDoH) - Referral to [facility] Care Management for community resources. - AMB Referral [facility] Care Management   Patient was given clear instructions to go to Emergency Department or return to medical center if symptoms don't improve, worsen, or new problems develop.The patient verbalized understanding.  I discussed the assessment and treatment plan with the patient. The  patient was provided an opportunity to ask questions and all were answered. The patient agreed with the plan and demonstrated an understanding of the instructions.   The patient was advised to call back or seek an in-person evaluation if the symptoms worsen or if the condition fails to improve as anticipated.    Lavona Pounds, NP 08/13/2023, 10:40 AM Primary Care at [facility]

## 2023-08-14 ENCOUNTER — Ambulatory Visit: Payer: Self-pay | Admitting: Family

## 2023-08-14 LAB — BASIC METABOLIC PANEL WITH GFR
BUN/Creatinine Ratio: 15 (ref 9–20)
BUN: 11 mg/dL (ref 6–20)
CO2: 20 mmol/L (ref 20–29)
Calcium: 9.7 mg/dL (ref 8.7–10.2)
Chloride: 97 mmol/L (ref 96–106)
Creatinine, Ser: 0.72 mg/dL — ABNORMAL LOW (ref 0.76–1.27)
Glucose: 409 mg/dL — ABNORMAL HIGH (ref 70–99)
Potassium: 4.5 mmol/L (ref 3.5–5.2)
Sodium: 138 mmol/L (ref 134–144)
eGFR: 123 mL/min/{1.73_m2} (ref >59)

## 2023-08-14 LAB — LIPID PANEL
Chol/HDL Ratio: 4.2 ratio (ref 0.0–5.0)
Cholesterol, Total: 203 mg/dL — ABNORMAL HIGH (ref 100–199)
HDL: 48 mg/dL (ref >39)
LDL Chol Calc (NIH): 137 mg/dL — ABNORMAL HIGH (ref 0–99)
Triglycerides: 99 mg/dL (ref 0–149)
VLDL Cholesterol Cal: 18 mg/dL (ref 5–40)

## 2023-08-14 LAB — HEMOGLOBIN A1C
Est. average glucose Bld gHb Est-mCnc: 387 mg/dL
Hgb A1c MFr Bld: 15.1 % — ABNORMAL HIGH (ref 4.8–5.6)

## 2023-08-14 LAB — MICROALBUMIN / CREATININE URINE RATIO
Creatinine, Urine: 28.6 mg/dL
Microalb/Creat Ratio: 22 mg/g{creat} (ref 0–29)
Microalbumin, Urine: 6.2 ug/mL

## 2023-08-16 ENCOUNTER — Telehealth: Payer: Self-pay

## 2023-08-16 DIAGNOSIS — Z0100 Encounter for examination of eyes and vision without abnormal findings: Secondary | ICD-10-CM

## 2023-08-16 NOTE — Telephone Encounter (Signed)
 Copied from CRM [id number]. Topic: Clinical - Lab/Test Results >> Aug 16, 2023 11:46 AM Dylan Watts B wrote: Reason for CRM: patient is calling to get test results please call patient back [phone]

## 2023-08-21 ENCOUNTER — Telehealth: Payer: Self-pay | Admitting: *Deleted

## 2023-08-21 NOTE — Telephone Encounter (Signed)
 Patient has no concerns with lab test at this time.  He is requesting referral for ophthalmology. Referral was placed and will mail patient resources to eye specialist.   Patient verbalized understanding.

## 2023-08-21 NOTE — Addendum Note (Signed)
 Addended by: Likisha Alles on: 08/21/2023 11:17 AM   Modules accepted: Orders

## 2023-08-21 NOTE — Progress Notes (Signed)
 Complex Care Management Note  Care Guide Note 08/21/2023 Name: Dylan Watts MRN: [id number] DOB: 02-Jan-1991  Dylan Watts is a 33 y.o. year old male who sees Senaida Dama, NP for primary care. I reached out to [employer] by phone today to offer complex care management services.  [name] was given information about Complex Care Management services today including:   The Complex Care Management services include support from the care team which includes your Nurse Care Manager, Clinical Social Worker, or Pharmacist.  The Complex Care Management team is here to help remove barriers to the health concerns and goals most important to you. Complex Care Management services are voluntary, and the patient may decline or stop services at any time by request to their care team member.   Complex Care Management Consent Status: Patient agreed to services and verbal consent obtained.   Follow up plan:  Telephone appointment with complex care management team member scheduled for:  6/11 with BSW and 6/16 with RNCM   Encounter Outcome:  Patient Scheduled  Barnie Bora  [facility]  [facility], [facility] Guide  Direct Dial : [phone]  Fax [phone]

## 2023-08-27 ENCOUNTER — Ambulatory Visit: Admitting: Podiatry

## 2023-08-28 ENCOUNTER — Other Ambulatory Visit: Payer: Self-pay

## 2023-08-28 DIAGNOSIS — F419 Anxiety disorder, unspecified: Secondary | ICD-10-CM

## 2023-08-28 DIAGNOSIS — F32A Depression, unspecified: Secondary | ICD-10-CM

## 2023-08-28 NOTE — Patient Instructions (Signed)
 Visit Information  Mr. Beazer was given information about Medicaid Managed Care team care coordination services as a part of their [place] Medicaid benefit. Izaha Shughart verbally consented to engagement with the [facility] Managed Care team.   If you are experiencing a medical emergency, please call 911 or report to your local emergency department or urgent care.   If you have a non-emergency medical problem during routine business hours, please contact your provider's office and ask to speak with a nurse.   For questions related to your [place] health plan, please call: [phone]  If you would like to schedule transportation through your [place] plan, please call the following number at least 2 days in advance of your appointment: [phone].   There is no limit to the number of trips during the year between medical appointments, healthcare facilities, or pharmacies. Transportation must be scheduled at least 2 business days before but not more than thirty 30 days before of your appointment.  Call the Behavioral Health Crisis Line at [phone], at any time, 24 hours a day, 7 days a week. If you are in danger or need immediate medical attention call 911.  If you would like help to quit smoking, call 1-800-QUIT-NOW (479-038-8478) OR Espaol: 1-855-Djelo-Ya (4-132-440-1027) o para ms informacin haga clic aqu or Text READY to 253-664 to register via text  Mr. Gaida - following are the goals we discussed in your visit today:   Goals Addressed             This Visit's Progress    BSW VBCI Social Work Care Plan   On track    Problems:   Corporate treasurer  and Housing   CSW Clinical Goal(s):   Over the next 2 weeks the Patient will work with Child psychotherapist to address concerns related to financial strain, and housing instability with rent and utility assistance .  Interventions:  SW will provide community resources to Pt for food,  any rental/utility assistance, and general information regarding non-medical transportation through [employer].   Patient Goals/Self-Care Activities:  Call Department of Social Services [phone] to apply for food stamps. Get scheduled with a LCSW to navigate mild feelings of depression/anxiety.  Plan:   Telephone follow up appointment with care management team member scheduled for:  06/25 9AM and LCSW Appt 06/24 at 9AM.          Patient verbalizes understanding of instructions and care plan provided today and agrees to view in MyChart. Active MyChart status and patient understanding of how to access instructions and care plan via MyChart confirmed with patient.     Telephone follow up appointment with Managed Medicaid care management team member scheduled for: BSW 09/11/2023 at 9Am.   Burt Casco, BSW [place] [facility]/VBCI - [employer] Social Worker [phone]   Following is a copy of your plan of care:  There are no care plans that you recently modified to display for this patient.

## 2023-08-28 NOTE — Patient Outreach (Signed)
 Complex Care Management   Visit Note  08/28/2023  Name:  Dylan Watts MRN: [id number] DOB: May 15, 1990  Situation: Referral received for Complex Care Management related to SDOH Barriers:  Housing rent/utility assistance Financial Resource Strain I obtained verbal consent from Patient.  Visit completed with patient  on the phone  Background:   Past Medical History:  Diagnosis Date   Diabetes mellitus without complication (HCC)    DKA (diabetic ketoacidoses) 04/2019    Assessment:  SW called Pt over the phone. Pt was alert and cognitive. SDOH were assessed and the following needs were identified: financial strain and housing instability, specifically with rent/utility assistance. Pt confirmed that he recently loss his job and has been struggling financially. Pt states he moved in March 2025 to an apartment and with his recent job loss his rent for the month of June is past due and his [employer] bill is past due. SW updated Pt address on file to reflect new address. Pt confirmed with SW he is now employed again but does not get much work hours. Pt is actively looking for Full-time jobs. Pt confirmed he receives support from his mother with food and transportation since his car needs repairs but also [place]. SW offered Pt information on local food resources and Pt agreed. Pt states he applied for food stamps in March 2025 but was denied. Pt states he will re-apply again since he has experienced a reduction income. Pt is knowledgeable on how to apply online, but requested a paper application be emailed to him. SW asked if Pt needed any additional resources and Pt declined. Direct phone # for SW was provided to Pt and SW will provide Pt resources for his SDOH needs.   SW and Pt went over his upcoming appointments and confirmed he is scheduled for a diabetic foot exam next week. Pt also reported mild feelings of depression and anxiety and accepted to be referred to a LCSW for an initial  call. Appt with Alease Hunter, LCSW was scheduled for 09/10/2023 at 9AM.     SDOH Interventions    Flowsheet Row Patient Outreach Telephone from 08/28/2023 in [facility] POPULATION HEALTH DEPARTMENT Office Visit from 08/13/2023 in [facility] at [facility] Office Visit from 01/22/2019 in [facility] [place] [facility] [place]  SDOH Interventions     Food Insecurity Interventions Community Resources Provided Intervention Not Indicated --  Housing Interventions Community Resources Provided Intervention Not Indicated, AMB Referral --  Transportation Interventions Community Resources Provided Intervention Not Indicated --  Utilities Interventions Community Resources Provided Intervention Not Indicated --  Alcohol Usage Interventions -- Intervention Not Indicated (Score <7) --  Depression Interventions/Treatment  -- -- Medication, Counseling  [Social work consulted today's visit and patient started on medication]  Financial Strain Interventions [employer] Resources Provided [employer] Provided, Intervention Not Indicated --  Physical Activity Interventions -- Intervention Not Indicated --  Stress Interventions -- Intervention Not Indicated --  Social Connections Interventions -- Intervention Not Indicated --  Health Literacy Interventions -- Intervention Not Indicated --         Recommendation:   Referral for initial call with Alease Hunter, LCSW. Appt: 09/10/2023 9AM.   Follow Up Plan:   Telephone follow up appointment date/time:  09/11/2023 at 9AM  Burt Casco, BSW [place]/VBCI - [facility] Social Worker [phone]

## 2023-09-02 ENCOUNTER — Other Ambulatory Visit: Payer: Self-pay

## 2023-09-02 NOTE — Patient Outreach (Signed)
 Complex Care Management   Visit Note  09/02/2023  Name:  Dylan Watts MRN: [id number] DOB: Mar 30, 1990  Situation: Referral received for Complex Care Management related to Diabetes with Complications I obtained verbal consent from Patient.  Visit completed with Keitha Pata  on the phone  Background:   Past Medical History:  Diagnosis Date   Diabetes mellitus without complication (HCC)    DKA (diabetic ketoacidoses) 04/2019    Assessment: Patient Reported Symptoms:  Cognitive Cognitive Status: Able to follow simple commands, Alert and oriented to person, place, and time, Normal speech and language skills Cognitive/Intellectual Conditions Management [RPT]: None reported or documented in medical history or problem list      Neurological Neurological Review of Symptoms: No symptoms reported    HEENT HEENT Symptoms Reported: No symptoms reported      Cardiovascular Cardiovascular Symptoms Reported: No symptoms reported Does patient have uncontrolled Hypertension?: No Cardiovascular Conditions: Hypertension Cardiovascular Management Strategies: Medication therapy  Respiratory Respiratory Symptoms Reported: No symptoms reported    Endocrine Patient reports the following symptoms related to hypoglycemia or hyperglycemia : No symptoms reported Is patient diabetic?: Yes Is patient checking blood sugars at home?: Yes Endocrine Conditions: Diabetes Endocrine Management Strategies: Medication therapy, Coping strategies, Diet modification, Exercise Endocrine Comment: Patient has not started Lantus  due to waiting for medication to be ready for pickup at pharmacy. He plans to pick up and begin taking as ordered as soon as available.  Gastrointestinal Gastrointestinal Symptoms Reported: No symptoms reported (Last BM yesterday) Additional Gastrointestinal Details: Patient reports his appetite is good, varies day to day. Gastrointestinal Management Strategies: Diet modification Nutrition Risk  Screen (CP): No indicators present  Genitourinary Genitourinary Symptoms Reported: No symptoms reported    Integumentary Integumentary Symptoms Reported: No symptoms reported    Musculoskeletal Musculoskelatal Symptoms Reviewed: No symptoms reported Musculoskeletal Comment: Patient reports he walks daily. Falls in the past year?: No Number of falls in past year: 1 or less Was there an injury with Fall?: No Fall Risk Category Calculator: 0 Patient Fall Risk Level: Low Fall Risk Patient at Risk for Falls Due to: No Fall Risks  Psychosocial Psychosocial Symptoms Reported: Anxiety - if selected complete GAD Behavioral Health Conditions: Depression Behavioral Management Strategies: Coping strategies, Medication therapy Major Change/Loss/Stressor/Fears (CP): Resources Behaviors When Feeling Stressed/Fearful: Diversional activities Techniques to Cope with Loss/Stress/Change: Medication, Diversional activities, Exercise Quality of Family Relationships: involved, supportive Do you feel physically threatened by others?: No      09/02/2023   10:15 AM  Depression screen PHQ 2/9  Decreased Interest 2  Down, Depressed, Hopeless 2  PHQ - 2 Score 4  Altered sleeping 0  Tired, decreased energy 2  Change in appetite 1  Feeling bad or failure about yourself  3  Trouble concentrating 0  Moving slowly or fidgety/restless 1  Suicidal thoughts 0  PHQ-9 Score 11  Difficult doing work/chores Not difficult at all    There were no vitals filed for this visit.  Medications Reviewed Today     Reviewed by Valaria Garland, RN (Registered Nurse) on 09/02/23 at 1004  Med List Status: <None>   Medication Order Taking? Sig Documenting Provider Last Dose Status Informant  atorvastatin  (LIPITOR) 20 MG tablet [id number]  Take 1 tablet (20 mg total) by mouth daily. Senaida Dama, NP  Active   FLUoxetine  (PROZAC ) 20 MG capsule [id number]  Take 1 capsule (20 mg total) by mouth every morning. Senaida Dama, NP  Active   fluticasone  (FLONASE ) 50 MCG/ACT  nasal spray [id number]  Place 1 spray into both nostrils daily.  Patient not taking: Reported on 08/13/2023   Raspet, Erin K, PA-C  Active   hydrOXYzine  (ATARAX ) 25 MG tablet [id number]  Take 1 tablet (25 mg total) by mouth 2 (two) times daily as needed. Senaida Dama, NP  Active     Discontinued 09/01/22 1229 (Reorder)   insulin  glargine (LANTUS ) 100 UNIT/ML Solostar Pen [id number]  Inject 40 Units into the skin daily. Senaida Dama, NP  Active   insulin  lispro (HUMALOG  KWIKPEN) 100 UNIT/ML KwikPen [id number]  Inject 10 Units into the skin 3 (three) times daily with meals. Senaida Dama, NP  Active   Insulin  Pen Needle (TECHLITE PEN NEEDLES) 31G X 8 MM MISC [id number]  1 each by Other route 4 (four) times daily -  before meals and at bedtime. Senaida Dama, NP  Active   metFORMIN  (GLUCOPHAGE ) 1000 MG tablet [id number]  Take 1 tablet (1,000 mg total) by mouth daily with breakfast.  Patient not taking: Reported on 08/13/2023   Clapacs, Elida Grounds, MD  Active   metoprolol  tartrate (LOPRESSOR ) 25 MG tablet [id number]  Take 0.5 tablets (12.5 mg total) by mouth 2 (two) times daily. Senaida Dama, NP  Active   promethazine -dextromethorphan (PROMETHAZINE -DM) 6.25-15 MG/5ML syrup [id number]  Take 5 mLs by mouth 3 (three) times daily as needed for cough.  Patient not taking: Reported on 08/13/2023   Raspet, Erin K, PA-C  Active   traZODone  (DESYREL ) 50 MG tablet [id number]  Take 1 tablet (50 mg total) by mouth at bedtime as needed for sleep.  Patient not taking: Reported on 08/13/2023   Clapacs, Elida Grounds, MD  Active             Recommendation:   PCP Follow-up Continue Current Plan of Care Pick up Lantus  once available at pharmacy  Follow Up Plan:   Telephone follow up appointment date/time:  09/17/23 at 10 AM  Theodora Fish, RN MSN [place]  [facility] RN Care Manager Direct Dial : [phone]  Fax: [phone]

## 2023-09-02 NOTE — Patient Instructions (Signed)
 Visit Information  Thank you for taking time to visit with me today. Please don't hesitate to contact me if I can be of assistance to you before our next scheduled appointment.  Our next appointment is by telephone on 09/17/23 at 10 AM Please call the care guide team at [phone] if you need to cancel or reschedule your appointment.   Following is a copy of your care plan:   Goals Addressed             This Visit's Progress    VBCI RN Care Plan related to DM II   On track    Problems:  Chronic Disease Management support and education needs related to DMII  Goal: Over the next 30 days the Patient will attend all scheduled medical appointments: Podiatry, PCP as evidenced by completed visit notes uploaded to EMR        demonstrate Improved adherence to prescribed treatment plan for DMII as evidenced by blood sugars lowering towards goal (last A1.c 15.1%), patient report of blood sugars improving take all medications exactly as prescribed and will call provider for medication related questions as evidenced by patient report of medication adherence, including picking up Lantus  once available and taking as ordered    verbalize understanding of plan for management of DMII as evidenced by patient report of understanding work with Child psychotherapist to address Housing barriers and Mental Health Concerns  related to the management of depression and anxiety as evidenced by review of electronic medical record and patient or social worker report      Interventions:   Diabetes Interventions: Assessed patient's understanding of A1c goal: <7% Reviewed medications with patient and discussed importance of medication adherence Counseled on importance of regular laboratory monitoring as prescribed Discussed plans with patient for ongoing care management follow up and provided patient with direct contact information for care management team Provided patient with written educational materials related to hypo  and hyperglycemia and importance of correct treatment Reviewed scheduled/upcoming provider appointments including: PCP 09/16/23 Advised patient, providing education and rationale, to check cbg as advised by provider and record, calling PCP for findings outside established parameters Screening for signs and symptoms of depression related to chronic disease state  Information related to diabetic diet sent to patient via MyChart Lab Results  Component Value Date   HGBA1C 15.1 (H) 08/13/2023    Patient Self-Care Activities:  Attend all scheduled provider appointments Call provider office for new concerns or questions  Take medications as prescribed   Work with the social worker to address care coordination needs and will continue to work with the clinical team to address health care and disease management related needs schedule appointment with eye doctor check blood sugar at prescribed times: as advised by provider check feet daily for cuts, sores or redness enter blood sugar readings and medication or insulin  into daily log take the blood sugar log to all doctor visits  Plan:  Next PCP appointment scheduled for:  Telephone follow up appointment with care management team member scheduled for:  09/17/23 at 10 AM Patient has upcoming appointments with BSW and LCSW             Please call the Suicide and Crisis Lifeline: 988 call 1-800-273-TALK (toll free, 24 hour hotline) if you are experiencing a Mental Health or Behavioral Health Crisis or need someone to talk to.  Patient verbalizes understanding of instructions and care plan provided today and agrees to view in MyChart. Active MyChart status and patient understanding  of how to access instructions and care plan via MyChart confirmed with patient.     Theodora Fish, RN MSN [place]  VBCI Population Health RN Care Manager Direct Dial : [phone]  Fax: [phone]  Diabetes Mellitus and Nutrition, Adult When you have  diabetes, or diabetes mellitus, it is very important to have healthy eating habits because your blood sugar (glucose) levels are greatly affected by what you eat and drink. Eating healthy foods in the right amounts, at about the same times every day, can help you: Manage your blood glucose. Lower your risk of heart disease. Improve your blood pressure. Reach or maintain a healthy weight. What can affect my meal plan? Every person with diabetes is different, and each person has different needs for a meal plan. Your health care provider may recommend that you work with a dietitian to make a meal plan that is best for you. Your meal plan may vary depending on factors such as: The calories you need. The medicines you take. Your weight. Your blood glucose, blood pressure, and cholesterol levels. Your activity level. Other health conditions you have, such as heart or kidney disease. How do carbohydrates affect me? Carbohydrates, also called carbs, affect your blood glucose level more than any other type of food. Eating carbs raises the amount of glucose in your blood. It is important to know how many carbs you can safely have in each meal. This is different for every person. Your dietitian can help you calculate how many carbs you should have at each meal and for each snack. How does alcohol affect me? Alcohol can cause a decrease in blood glucose (hypoglycemia), especially if you use insulin  or take certain diabetes medicines by mouth. Hypoglycemia can be a life-threatening condition. Symptoms of hypoglycemia, such as sleepiness, dizziness, and confusion, are similar to symptoms of having too much alcohol. Do not drink alcohol if: Your health care provider tells you not to drink. You are pregnant, may be pregnant, or are planning to become pregnant. If you drink alcohol: Limit how much you have to: 0-1 drink a day for women. 0-2 drinks a day for men. Know how much alcohol is in your drink. In the  [place] one drink equals one 12 oz bottle of beer (355 mL), one 5 oz glass of wine (148 mL), or one 1 oz glass of hard liquor (44 mL). Keep yourself hydrated with water, diet soda, or unsweetened iced tea. Keep in mind that regular soda, juice, and other mixers may contain a lot of sugar and must be counted as carbs. What are tips for following this plan?  Reading food labels Start by checking the serving size on the Nutrition Facts label of packaged foods and drinks. The number of calories and the amount of carbs, fats, and other nutrients listed on the label are based on one serving of the item. Many items contain more than one serving per package. Check the total grams (g) of carbs in one serving. Check the number of grams of saturated fats and trans fats in one serving. Choose foods that have a low amount or none of these fats. Check the number of milligrams (mg) of salt (sodium) in one serving. Most people should limit total sodium intake to less than 2,300 mg per day. Always check the nutrition information of foods labeled as low-fat or nonfat. These foods may be higher in added sugar or refined carbs and should be avoided. Talk to your dietitian to identify your daily goals for  nutrients listed on the label. Shopping Avoid buying canned, pre-made, or processed foods. These foods tend to be high in fat, sodium, and added sugar. Shop around the outside edge of the grocery store. This is where you will most often find fresh fruits and vegetables, bulk grains, fresh meats, and fresh dairy products. Cooking Use low-heat cooking methods, such as baking, instead of high-heat cooking methods, such as deep frying. Cook using healthy oils, such as olive, canola, or sunflower oil. Avoid cooking with butter, cream, or high-fat meats. Meal planning Eat meals and snacks regularly, preferably at the same times every day. Avoid going long periods of time without eating. Eat foods that are high in  fiber, such as fresh fruits, vegetables, beans, and whole grains. Eat 4-6 oz (112-168 g) of lean protein each day, such as lean meat, chicken, fish, eggs, or tofu. One ounce (oz) (28 g) of lean protein is equal to: 1 oz (28 g) of meat, chicken, or fish. 1 egg.  cup (62 g) of tofu. Eat some foods each day that contain healthy fats, such as avocado, nuts, seeds, and fish. What foods should I eat? Fruits Berries. Apples. Oranges. Peaches. Apricots. Plums. Grapes. Mangoes. Papayas. Pomegranates. Kiwi. Cherries. Vegetables Leafy greens, including lettuce, spinach, kale, chard, collard greens, mustard greens, and cabbage. Beets. Cauliflower. Broccoli. Carrots. Green beans. Tomatoes. Peppers. Onions. Cucumbers. Brussels sprouts. Grains Whole grains, such as whole-wheat or whole-grain bread, crackers, tortillas, cereal, and pasta. Unsweetened oatmeal. Quinoa. Brown or wild rice. Meats and other proteins Seafood. Poultry without skin. Lean cuts of poultry and beef. Tofu. Nuts. Seeds. Dairy Low-fat or fat-free dairy products such as milk, yogurt, and cheese. The items listed above may not be a complete list of foods and beverages you can eat and drink. Contact a dietitian for more information. What foods should I avoid? Fruits Fruits canned with syrup. Vegetables Canned vegetables. Frozen vegetables with butter or cream sauce. Grains Refined white flour and flour products such as bread, pasta, snack foods, and cereals. Avoid all processed foods. Meats and other proteins Fatty cuts of meat. Poultry with skin. Breaded or fried meats. Processed meat. Avoid saturated fats. Dairy Full-fat yogurt, cheese, or milk. Beverages Sweetened drinks, such as soda or iced tea. The items listed above may not be a complete list of foods and beverages you should avoid. Contact a dietitian for more information. Questions to ask a health care provider Do I need to meet with a certified diabetes care and education  specialist? Do I need to meet with a dietitian? What number can I call if I have questions? When are the best times to check my blood glucose? Where to find more information: American Diabetes Association: diabetes.org Academy of Nutrition and Dietetics: eatright.[employer] of Diabetes and Digestive and Kidney Diseases: StageSync.si Association of Diabetes Care & Education Specialists: diabeteseducator.org Summary It is important to have healthy eating habits because your blood sugar (glucose) levels are greatly affected by what you eat and drink. It is important to use alcohol carefully. A healthy meal plan will help you manage your blood glucose and lower your risk of heart disease. Your health care provider may recommend that you work with a dietitian to make a meal plan that is best for you. This information is not intended to replace advice given to you by your health care provider. Make sure you discuss any questions you have with your health care provider. Document Revised: 10/06/2019 Document Reviewed: 10/07/2019 Elsevier Patient Education  2024  Elsevier Inc.  Carbohydrate Counting for Diabetes Mellitus, Adult Carbohydrate counting is a method of keeping track of how many carbohydrates you eat. Eating carbohydrates increases the amount of sugar (glucose) in the blood. Counting how many carbohydrates you eat improves how well you manage your blood glucose. This, in turn, helps you manage your diabetes. Carbohydrates are measured in grams (g) per serving. It is important to know how many carbohydrates (in grams or by serving size) you can have in each meal. This is different for every person. A dietitian can help you make a meal plan and calculate how many carbohydrates you should have at each meal and snack. What foods contain carbohydrates? Carbohydrates are found in the following foods: Grains, such as breads and cereals. Dried beans and soy products. Starchy  vegetables, such as potatoes, peas, and corn. Fruit and fruit juices. Milk and yogurt. Sweets and snack foods, such as cake, cookies, candy, chips, and soft drinks. How do I count carbohydrates in foods? There are two ways to count carbohydrates in food. You can read food labels or learn standard serving sizes of foods. You can use either of these methods or a combination of both. Using the Nutrition Facts label The Nutrition Facts list is included on the labels of almost all packaged foods and beverages in the [place] . It includes: The serving size. Information about nutrients in each serving, including the grams of carbohydrate per serving. To use the Nutrition Facts, decide how many servings you will have. Then, multiply the number of servings by the number of carbohydrates per serving. The resulting number is the total grams of carbohydrates that you will be having. Learning the standard serving sizes of foods When you eat carbohydrate foods that are not packaged or do not include Nutrition Facts on the label, you need to measure the servings in order to count the grams of carbohydrates. Measure the foods that you will eat with a food scale or measuring cup, if needed. Decide how many standard-size servings you will eat. Multiply the number of servings by 15. For foods that contain carbohydrates, one serving equals 15 g of carbohydrates. For example, if you eat 2 cups or 10 oz (300 g) of strawberries, you will have eaten 2 servings and 30 g of carbohydrates (2 servings x 15 g = 30 g). For foods that have more than one food mixed, such as soups and casseroles, you must count the carbohydrates in each food that is included. The following list contains standard serving sizes of common carbohydrate-rich foods. Each of these servings has about 15 g of carbohydrates: 1 slice of bread. 1 six-inch (15 cm) tortilla. ? cup or 2 oz (53 g) cooked rice or pasta.  cup or 3 oz (85 g) cooked or  canned, drained and rinsed beans or lentils.  cup or 3 oz (85 g) starchy vegetable, such as peas, corn, or squash.  cup or 4 oz (120 g) hot cereal.  cup or 3 oz (85 g) boiled or mashed potatoes, or  or 3 oz (85 g) of a large baked potato.  cup or 4 fl oz (118 mL) fruit juice. 1 cup or 8 fl oz (237 mL) milk. 1 small or 4 oz (106 g) apple.  or 2 oz (63 g) of a medium banana. 1 cup or 5 oz (150 g) strawberries. 3 cups or 1 oz (28.3 g) popped popcorn. What is an example of carbohydrate counting? To calculate the grams of carbohydrates in this sample  meal, follow the steps shown below. Sample meal 3 oz (85 g) chicken breast. ? cup or 4 oz (106 g) brown rice.  cup or 3 oz (85 g) corn. 1 cup or 8 fl oz (237 mL) milk. 1 cup or 5 oz (150 g) strawberries with sugar-free whipped topping. Carbohydrate calculation Identify the foods that contain carbohydrates: Rice. Corn. Milk. Strawberries. Calculate how many servings you have of each food: 2 servings rice. 1 serving corn. 1 serving milk. 1 serving strawberries. Multiply each number of servings by 15 g: 2 servings rice x 15 g = 30 g. 1 serving corn x 15 g = 15 g. 1 serving milk x 15 g = 15 g. 1 serving strawberries x 15 g = 15 g. Add together all of the amounts to find the total grams of carbohydrates eaten: 30 g + 15 g + 15 g + 15 g = 75 g of carbohydrates total. What are tips for following this plan? Shopping Develop a meal plan and then make a shopping list. Buy fresh and frozen vegetables, fresh and frozen fruit, dairy, eggs, beans, lentils, and whole grains. Look at food labels. Choose foods that have more fiber and less sugar. Avoid processed foods and foods with added sugars. Meal planning Aim to have the same number of grams of carbohydrates at each meal and for each snack time. Plan to have regular, balanced meals and snacks. Where to find more information American Diabetes Association: diabetes.org Centers for  Disease Control and Prevention: TonerPromos.no Academy of Nutrition and Dietetics: eatright.org Association of Diabetes Care & Education Specialists: diabeteseducator.org Summary Carbohydrate counting is a method of keeping track of how many carbohydrates you eat. Eating carbohydrates increases the amount of sugar (glucose) in your blood. Counting how many carbohydrates you eat improves how well you manage your blood glucose. This helps you manage your diabetes. A dietitian can help you make a meal plan and calculate how many carbohydrates you should have at each meal and snack. This information is not intended to replace advice given to you by your health care provider. Make sure you discuss any questions you have with your health care provider. Document Revised: 10/06/2019 Document Reviewed: 10/07/2019 Elsevier Patient Education  2024 [employer].

## 2023-09-03 ENCOUNTER — Encounter: Payer: Self-pay | Admitting: Podiatry

## 2023-09-03 ENCOUNTER — Ambulatory Visit (INDEPENDENT_AMBULATORY_CARE_PROVIDER_SITE_OTHER): Admitting: Podiatry

## 2023-09-03 DIAGNOSIS — M79672 Pain in left foot: Secondary | ICD-10-CM

## 2023-09-03 DIAGNOSIS — B351 Tinea unguium: Secondary | ICD-10-CM

## 2023-09-03 DIAGNOSIS — L600 Ingrowing nail: Secondary | ICD-10-CM

## 2023-09-03 DIAGNOSIS — M79671 Pain in right foot: Secondary | ICD-10-CM

## 2023-09-03 NOTE — Progress Notes (Signed)
 Patient presents for evaluation and treatment of tenderness and some redness around nails feet.  Tenderness around toes with walking and wearing shoes.  Physical exam:  General appearance: Alert, pleasant, and in no acute distress.  Vascular: Pedal pulses: DP 2/4 B/L, PT 2/4 B/L.  Minimal edema lower legs bilaterally.  Capillary refill time immediate bilaterally  Neurological:  Vibratory sensation intact bilaterally feet.  Monofilament sensation tact feet bilaterally.  Negative Tinel's sign tarsal tunnel, porta pedis, and cutaneous peripheral nerve foot and ankle bilaterally.  Achilles tendon reflex normal bilaterally  Dermatologic:  Nails thickened, disfigured, discolored 1-5 BL with subungual debris.  Redness and hypertrophic nail folds along nail folds bilaterally but no signs of drainage or infection.  Some hyperkeratotic lesions on the foot bilaterally  Musculoskeletal:  Hammertoes 2 through 5 bilaterally.  Good range of motion ankle joint    Diagnosis: 1. Painful onychomycotic nails 1 through 5 bilaterally. 2. Pain toes 1 through 5 bilaterally. 3.  Ingrown nails bilaterally  Plan: -New patient office visit level 3 for evaluation and management.  Modifier 25 -Debrided onychomycotic nails 1 through 5 bilaterally. -Discussed diabetes and precautions he needs to take.  Discussed with him that he does not have any early signs neuropathy however this can develop over time cautious.  Discussed proper shoes to wear.   Return prn

## 2023-09-10 ENCOUNTER — Other Ambulatory Visit: Payer: Self-pay | Admitting: Licensed Clinical Social Worker

## 2023-09-10 NOTE — Patient Outreach (Signed)
 LCSW introduced self and explained role in Complex Care Management. Pt reported that he needs to re-schedule initial appt, due to an emergency. LCSW will follow up with patient on 09/19/23.

## 2023-09-11 ENCOUNTER — Other Ambulatory Visit: Payer: Self-pay

## 2023-09-11 NOTE — Patient Outreach (Signed)
 BSW made multiple outreach attempts during scheduled call with Pt. No successful contact was made. SW left VM stating Pt would be rescheduled for next week same time or to call back to reschedule.   SW will follow-up with Patient over email as well to notify him of his missed appointment and reschedule for next week or for him to call back to reschedule sooner.

## 2023-09-11 NOTE — Patient Instructions (Signed)
 Visit Information  Thank you for taking time to visit with me today. Please don't hesitate to contact me if I can be of assistance to you before our next scheduled appointment.  Your next care management appointment is by telephone on 09/18/2023 at 10AM    Please call the care guide team at [phone] if you need to cancel, schedule, or reschedule an appointment.   Please call the Suicide and Crisis Lifeline: 988 go to [facility] [address] [place] [phone]) call 911 if you are experiencing a Mental Health or Behavioral Health Crisis or need someone to talk to.  Dylan Watts, BSW [place]/VBCI - [employer] Social Worker [phone]

## 2023-09-12 ENCOUNTER — Telehealth: Payer: Self-pay | Admitting: Family

## 2023-09-12 NOTE — Telephone Encounter (Signed)
 Faxed paper back today

## 2023-09-12 NOTE — Telephone Encounter (Signed)
 Patient was identified as falling into the [place] Measure - Diabetes.   Patient was: Appointment already scheduled for:  09/16/2023.

## 2023-09-13 ENCOUNTER — Other Ambulatory Visit: Payer: Self-pay | Admitting: Family

## 2023-09-13 DIAGNOSIS — E785 Hyperlipidemia, unspecified: Secondary | ICD-10-CM

## 2023-09-13 NOTE — Telephone Encounter (Signed)
 Complete

## 2023-09-16 ENCOUNTER — Encounter: Admitting: Family

## 2023-09-16 ENCOUNTER — Telehealth: Payer: Self-pay | Admitting: Family

## 2023-09-16 NOTE — Progress Notes (Signed)
 Erroneous encounter-disregard

## 2023-09-16 NOTE — Telephone Encounter (Signed)
 Called patient to reschedule missed appointment no answer left voicemail

## 2023-09-17 ENCOUNTER — Telehealth: Payer: Self-pay

## 2023-09-17 NOTE — Patient Outreach (Signed)
 Attempted to reach patient for RNCM follow-up. No answer. HIPAA compliant voicemail left. Will continue with outreach attempts.  Rosaline Finlay, RN MSN [place]  VBCI Population Health RN Care Manager Direct Dial : [phone]  Fax: [phone]

## 2023-09-18 ENCOUNTER — Other Ambulatory Visit: Payer: Self-pay

## 2023-09-18 NOTE — Patient Outreach (Signed)
 Complex Care Management   Visit Note  09/18/2023  Name:  Dylan Watts MRN: [id number] DOB: June 23, 1990  Situation: Referral received for Complex Care Management related to SDOH Barriers:  Housing rent Food insecurity Lack of essential utilities past due [employer] bill I obtained verbal consent from Patient.  Visit completed with patient  on the phone  Background:   Past Medical History:  Diagnosis Date   Diabetes mellitus without complication (HCC)    DKA (diabetic ketoacidoses) 04/2019    Assessment: SW met with patient over the phone today for follow-up call. Patient was alert and cognitive. Patient apologies for missing appointment the past time. Patient experienced an emergency with roommate and had a lot going on in the past two weeks. Patient states things are better now and roommate is no longer living with him. Patient confirmed he received BSW resources via email and was able to access a food pantry. Patient also confirmed with BSW that he applied for food stamps at DSS office about two weeks ago and is waiting for a response. BSW requested patient to provide caseworker contact information at next follow-up and patient agreed. Patient confirmed he does not need additional resources at this time and was appreciative of the support provided to him. BSW encouraged patient to continue his scheduled appts with LCSW to address any mental/behavior needs as well as continue medication adherence as instructed by his PCP. Patient understood and agreed.   SDOH Interventions    Flowsheet Row Patient Outreach Telephone from 09/18/2023 in [place] POPUL[facility] DEPARTMENT Patient Outreach Telephone from 09/02/2023 in [place] POPULATION HEALTH DEPARTMENT Patient Outreach Telephone from 08/28/2023 in [place] POPULATION HEALTH DEPARTMENT Office Visit from 08[facility]13/2023 in [facility] Office Visit from 01/22/2019 in [facility] [place] [facility]  SDOH Interventions       Food Insecurity Interventions Community Resources Provided  [Patient states recieved food resources and has been able access a food pantry.] -- Community Resources Provided Intervention Not Indicated --  Housing Interventions Intervention Not Indicated -- [employer] Provided Intervention Not Indicated, AMB Referral --  Transportation Interventions Intervention Not Indicated -- Programmer, applications Provided Intervention Not Indicated --  Utilities Interventions Intervention Not Indicated -- [employer] Provided Intervention Not Indicated --  Alcohol Usage Interventions -- -- -- Intervention Not Indicated (Score <7) --  Depression Interventions/Treatment  -- Currently on Treatment -- -- Medication, Counseling  [Social work consulted today's visit and patient started on medication]  Financial Strain Interventions Intervention Not Indicated -- Programmer, applications Provided [employer] Provided, Intervention Not Indicated --  Physical Activity Interventions -- -- -- Intervention Not Indicated --  Stress Interventions -- -- -- Intervention Not Indicated --  Social Connections Interventions -- -- -- Intervention Not Indicated --  Health Literacy Interventions -- -- -- Intervention Not Indicated --      Recommendation:   None at this time.   Follow Up Plan:   Telephone follow up appointment date/time:  09/26/2023 2pm  Laymon Doll, BSW [place]/VBCI - [facility] Social Worker [phone]

## 2023-09-18 NOTE — Patient Instructions (Signed)
 Visit Information  Dylan Watts was given information about Medicaid Managed Care team care coordination services as a part of their [place] Medicaid benefit. Dylan Watts verbally consented to engagement with the [facility] Managed Care team.   If you are experiencing a medical emergency, please call 911 or report to your local emergency department or urgent care.   If you have a non-emergency medical problem during routine business hours, please contact your provider's office and ask to speak with a nurse.   For questions related to your [place] Medicaid health plan, please call: [phone]  If you would like to schedule transportation through your [place] Medicaid plan, please call the following number at least 2 days in advance of your appointment: [phone].   There is no limit to the number of trips during the year between medical appointments, healthcare facilities, or pharmacies. Transportation must be scheduled at least 2 business days before but not more than thirty 30 days before of your appointment.  Call the Behavioral Health Crisis Line at [phone], at any time, 24 hours a day, 7 days a week. If you are in danger or need immediate medical attention call 911.  If you would like help to quit smoking, call 1-800-QUIT-NOW ((908) 833-9954) OR Espaol: 1-855-Djelo-Ya (8-144-664-6430) o para ms informacin haga clic aqu or Text READY to 799-599 to register via text  Dylan Watts - following are the goals we discussed in your visit today:   Goals Addressed             This Visit's Progress    COMPLETED: BSW VBCI Social Work Care Plan       Problems:   Corporate treasurer  and Housing   CSW Clinical Goal(s):   Over the next 2 weeks the Patient will work with Child psychotherapist to address concerns related to financial strain, and housing instability with rent and utility assistance .  Interventions:  SW will provide community resources to Pt for food,  any rental/utility assistance, and general information regarding non-medical transportation through [employer].   Patient Goals/Self-Care Activities:  Call Department of Social Services [phone] to apply for food stamps. Get scheduled with a LCSW to navigate mild feelings of depression/anxiety.  Plan:   Telephone follow up appointment with care management team member scheduled for:  06/25 9AM and LCSW Appt 06/24 at 9AM.         Patient verbalizes understanding of instructions and care plan provided today and agrees to view in MyChart. Active MyChart status and patient understanding of how to access instructions and care plan via MyChart confirmed with patient.     Telephone follow up appointment with Managed Medicaid care management team member scheduled for: 09/26/2023 at 2pm  Dylan Watts, [place]/VBCI - [facility] Social Worker [phone]   Following is a copy of your plan of care:  There are no care plans that you recently modified to display for this patient.

## 2023-09-18 NOTE — Patient Outreach (Signed)
 Attempted to reach patient for RNCM follow-up. No answer. HIPAA compliant voicemail left. Will continue with outreach attempts.   Rosaline Finlay, RN MSN [place]  VBCI Population Health RN Care Manager Direct Dial : [phone]  Fax: [phone]

## 2023-09-19 ENCOUNTER — Telehealth: Payer: Self-pay | Admitting: Licensed Clinical Social Worker

## 2023-09-19 ENCOUNTER — Encounter: Payer: Self-pay | Admitting: Licensed Clinical Social Worker

## 2023-09-23 NOTE — Patient Outreach (Signed)
 Care Coordination   09/23/2023 Name: Dylan Watts MRN: [id number] DOB: August 30, 1990   Care Coordination Outreach Attempts:  A third unsuccessful outreach was attempted for [facility] follow-up today.  Follow Up Plan:  No further outreach attempts will be made at this time. We have been unable to contact the patient to complete CMRN follow-up.  Encounter Outcome:  No Answer. HIPAA compliant voicemail left asking patient to return call.    Rosaline Finlay, RN MSN [place]  [facility] RN Care Manager Direct Dial : [phone]  Fax: [phone]

## 2023-09-26 ENCOUNTER — Other Ambulatory Visit: Payer: Self-pay

## 2023-09-26 NOTE — Patient Outreach (Signed)
 Attempted mutiple contacts to patient for scheduled f/u call with BSW today. Patient did not answer. VM was left with information regarding new appt date and time.   F/u rescheduled for: 10/04/2023 at 2:15pm

## 2023-10-04 ENCOUNTER — Other Ambulatory Visit: Payer: Self-pay

## 2023-10-07 ENCOUNTER — Telehealth: Payer: Self-pay

## 2023-10-13 ENCOUNTER — Other Ambulatory Visit: Payer: Self-pay | Admitting: Family

## 2023-10-13 DIAGNOSIS — I10 Essential (primary) hypertension: Secondary | ICD-10-CM

## 2023-10-14 ENCOUNTER — Other Ambulatory Visit: Payer: Self-pay

## 2023-10-14 NOTE — Telephone Encounter (Signed)
 Complete

## 2023-10-14 NOTE — Patient Outreach (Signed)
 BSW completed 3rd outreach attempt with patient. Patient did not answer. VM was left communicating to patient there will be no additional outreach attempts made at this time. Patient was instructed on how to call back or how to get another referral for BSW services.

## 2023-12-03 ENCOUNTER — Other Ambulatory Visit: Payer: Self-pay | Admitting: Family

## 2023-12-03 DIAGNOSIS — E1165 Type 2 diabetes mellitus with hyperglycemia: Secondary | ICD-10-CM

## 2023-12-03 DIAGNOSIS — F419 Anxiety disorder, unspecified: Secondary | ICD-10-CM

## 2023-12-03 NOTE — Telephone Encounter (Unsigned)
 Copied from CRM [id number]. Topic: Clinical - Medication Refill >> Dec 03, 2023 12:58 PM Corin V wrote: Medication: insulin  lispro (HUMALOG  KWIKPEN) 100 UNIT/ML KwikPen FLUoxetine  (PROZAC ) 20 MG capsule hydrOXYzine  (ATARAX ) 25 MG tablet insulin  glargine (LANTUS ) 100 UNIT/ML Solostar Pen  Has the patient contacted their pharmacy? Yes- no refills remaining (Agent: If no, request that the patient contact the pharmacy for the refill. If patient does not wish to contact the pharmacy document the reason why and proceed with request.) (Agent: If yes, when and what did the pharmacy advise?)  This is the patient's preferred pharmacy:  [id number] [address], [place] - [address] [place] [address] Phone: [phone] Fax: [phone]  Is this the correct pharmacy for this prescription? Yes If no, delete pharmacy and type the correct one.   Has the prescription been filled recently? No  Is the patient out of the medication? Yes  Has the patient been seen for an appointment in the last year OR does the patient have an upcoming appointment? Yes  Can we respond through MyChart? Yes  Agent: Please be advised that Rx refills may take up to 3 business days. We ask that you follow-up with your pharmacy.

## 2023-12-04 MED ORDER — INSULIN GLARGINE 100 UNIT/ML SOLOSTAR PEN: 40.0000 [IU] | PEN_INJECTOR | Freq: Every day | SUBCUTANEOUS | 1 refills | Status: DC

## 2023-12-04 MED ORDER — FLUOXETINE HCL 20 MG PO CAPS
20.0000 mg | ORAL_CAPSULE | Freq: Every morning | ORAL | 0 refills | Status: DC
Start: 2023-12-04 — End: 2024-01-31

## 2023-12-04 MED ORDER — HYDROXYZINE HCL 25 MG PO TABS: 25.0000 mg | ORAL_TABLET | Freq: Two times a day (BID) | ORAL | 0 refills | Status: DC | PRN

## 2023-12-04 MED ORDER — INSULIN LISPRO (1 UNIT DIAL) 100 UNIT/ML (KWIKPEN)
10.0000 [IU] | PEN_INJECTOR | Freq: Three times a day (TID) | SUBCUTANEOUS | 1 refills | Status: DC
Start: 2023-12-04 — End: 2024-01-31

## 2023-12-04 NOTE — Telephone Encounter (Signed)
 Requested Prescriptions  Pending Prescriptions Disp Refills   insulin  lispro (HUMALOG  KWIKPEN) 100 UNIT/ML KwikPen 15 mL 1    Sig: Inject 10 Units into the skin 3 (three) times daily with meals.     Endocrinology:  Diabetes - Insulins Failed - 12/04/2023  3:42 PM      Failed - HBA1C is between 0 and 7.9 and within 180 days    Hgb A1c MFr Bld  Date Value Ref Range Status  08/13/2023 15.1 (H) 4.8 - 5.6 % Final    Comment:             Prediabetes: 5.7 - 6.4          Diabetes: >6.4          Glycemic control for adults with diabetes: <7.0          Passed - Valid encounter within last 6 months    Recent Outpatient Visits           3 months ago Encounter to establish care   [facility] at [facility], [place], NP   4 years ago No-show for appointment   [facility] - A Dept Of [place]. [facility] [place], [place] J, NP   4 years ago Uncontrolled type 2 diabetes mellitus with hyperglycemia, without long-term current use of insulin  [facility])   [place] [facility] - A Dept Of [place]. [facility] Fleeta Morris, [place] L, RPH-CPP   4 years ago Uncontrolled type 2 diabetes mellitus with hyperglycemia, without long-term current use of insulin  [facility])   [place] [facility] - A Dept Of [place]. [facility] Fulp, [place], MD               FLUoxetine  (PROZAC ) 20 MG capsule 90 capsule 0    Sig: Take 1 capsule (20 mg total) by mouth every morning.     Psychiatry:  Antidepressants - SSRI Passed - 12/04/2023  3:42 PM      Passed - Completed PHQ-2 or PHQ-9 in the last 360 days      Passed - Valid encounter within last 6 months    Recent Outpatient Visits           3 months ago Encounter to establish care   [facility] at [facility], [place], NP   4 years ago No-show for appointment   [facility] - A Dept Of [place]. [facility] Stephan, [place] J, NP    4 years ago Uncontrolled type 2 diabetes mellitus with hyperglycemia, without long-term current use of insulin  [facility])   [place] [facility] - A Dept Of [place]. [facility] Fleeta Morris, [place] L, RPH-CPP   4 years ago Uncontrolled type 2 diabetes mellitus with hyperglycemia, without long-term current use of insulin  [facility])   [place] Comm Health [facility] - A Dept Of [place]. [facility] Fulp, [place], MD               hydrOXYzine  (ATARAX ) 25 MG tablet 180 tablet 0    Sig: Take 1 tablet (25 mg total) by mouth 2 (two) times daily as needed.     Ear, Nose, and Throat:  Antihistamines 2 Failed - 12/04/2023  3:42 PM      Failed - Cr in normal range and within 360 days    Creatinine, Ser  Date Value Ref Range  Status  08/13/2023 0.72 (L) 0.76 - 1.27 mg/dL Final         Passed - Valid encounter within last 12 months    Recent Outpatient Visits           3 months ago Encounter to establish care   [facility], [place], NP   4 years ago No-show for appointment   [facility] Health [facility] - A Dept Of [place]. [facility] [place], [place] J, NP   4 years ago Uncontrolled type 2 diabetes mellitus with hyperglycemia, without long-term current use of insulin  [facility])   [place] Comm Health [facility] - A Dept Of [place]. [facility] Fleeta Morris, [place] L, RPH-CPP   4 years ago Uncontrolled type 2 diabetes mellitus with hyperglycemia, without long-term current use of insulin  [facility])   [employer] [facility] Health [facility] - A Dept Of [place]. [facility] Fulp, [place], MD               insulin  glargine (LANTUS ) 100 UNIT/ML Solostar Pen 15 mL 1    Sig: Inject 40 Units into the skin daily.     Endocrinology:  Diabetes - Insulins Failed - 12/04/2023  3:42 PM      Failed - HBA1C is between 0 and 7.9 and within 180 days    Hgb A1c MFr Bld  Date Value Ref Range Status  08/13/2023 15.1 (H) 4.8 -  5.6 % Final    Comment:             Prediabetes: 5.7 - 6.4          Diabetes: >6.4          Glycemic control for adults with diabetes: <7.0          Passed - Valid encounter within last 6 months    Recent Outpatient Visits           3 months ago Encounter to establish care   [facility] at [facility], [place], NP   4 years ago No-show for appointment   [facility] Health [facility] - A Dept Of [place]. [facility] [place], [place] J, NP   4 years ago Uncontrolled type 2 diabetes mellitus with hyperglycemia, without long-term current use of insulin  [facility])   [place] Comm Health [facility] - A Dept Of [place]. [facility] Fleeta Morris, [place] L, RPH-CPP   4 years ago Uncontrolled type 2 diabetes mellitus with hyperglycemia, without long-term current use of insulin  [facility])   [place] Comm Health Shel[facility] - A Dept Of . [facility] Alec House, MD

## 2024-01-16 ENCOUNTER — Ambulatory Visit: Admitting: Family

## 2024-01-31 ENCOUNTER — Emergency Department (HOSPITAL_COMMUNITY)
Admission: EM | Admit: 2024-01-31 | Discharge: 2024-01-31 | Disposition: A | Discharge status: Home / Self Care | Attending: Emergency Medicine | Admitting: Emergency Medicine

## 2024-01-31 ENCOUNTER — Encounter (HOSPITAL_COMMUNITY): Payer: Self-pay

## 2024-01-31 ENCOUNTER — Other Ambulatory Visit: Payer: Self-pay

## 2024-01-31 DIAGNOSIS — E785 Hyperlipidemia, unspecified: Secondary | ICD-10-CM

## 2024-01-31 DIAGNOSIS — E1165 Type 2 diabetes mellitus with hyperglycemia: Secondary | ICD-10-CM | POA: Diagnosis not present

## 2024-01-31 DIAGNOSIS — I10 Essential (primary) hypertension: Secondary | ICD-10-CM

## 2024-01-31 DIAGNOSIS — F32A Depression, unspecified: Secondary | ICD-10-CM

## 2024-01-31 DIAGNOSIS — R112 Nausea with vomiting, unspecified: Secondary | ICD-10-CM | POA: Diagnosis present

## 2024-01-31 DIAGNOSIS — Z7984 Long term (current) use of oral hypoglycemic drugs: Secondary | ICD-10-CM | POA: Diagnosis not present

## 2024-01-31 DIAGNOSIS — R1114 Bilious vomiting: Secondary | ICD-10-CM | POA: Diagnosis not present

## 2024-01-31 DIAGNOSIS — Z794 Long term (current) use of insulin: Secondary | ICD-10-CM | POA: Insufficient documentation

## 2024-01-31 DIAGNOSIS — R739 Hyperglycemia, unspecified: Secondary | ICD-10-CM

## 2024-01-31 LAB — CBC WITH DIFFERENTIAL/PLATELET
Abs Immature Granulocytes: 0.1 K/uL — ABNORMAL HIGH (ref 0.00–0.07)
Basophils Absolute: 0 K/uL (ref 0.0–0.1)
Basophils Relative: 0 %
Eosinophils Absolute: 0.2 K/uL (ref 0.0–0.5)
Eosinophils Relative: 2 %
HCT: 48.8 % (ref 39.0–52.0)
Hemoglobin: 16.3 g/dL (ref 13.0–17.0)
Immature Granulocytes: 1 %
Lymphocytes Relative: 11 %
Lymphs Abs: 1.4 K/uL (ref 0.7–4.0)
MCH: 29.2 pg (ref 26.0–34.0)
MCHC: 33.4 g/dL (ref 30.0–36.0)
MCV: 87.3 fL (ref 80.0–100.0)
Monocytes Absolute: 0.8 K/uL (ref 0.1–1.0)
Monocytes Relative: 6 %
Neutro Abs: 11.1 K/uL — ABNORMAL HIGH (ref 1.7–7.7)
Neutrophils Relative %: 80 %
Platelets: 358 K/uL (ref 150–400)
RBC: 5.59 MIL/uL (ref 4.22–5.81)
RDW: 12.5 % (ref 11.5–15.5)
WBC: 13.7 K/uL — ABNORMAL HIGH (ref 4.0–10.5)
nRBC: 0 % (ref 0.0–0.2)

## 2024-01-31 LAB — URINALYSIS, ROUTINE W REFLEX MICROSCOPIC
Bacteria, UA: NONE SEEN
Bilirubin Urine: NEGATIVE
Glucose, UA: >=500 mg/dL — AB
Hgb urine dipstick: NEGATIVE
Ketones, ur: 80 mg/dL — AB
Leukocytes,Ua: NEGATIVE
Nitrite: NEGATIVE
Protein, ur: 100 mg/dL — AB
Specific Gravity, Urine: 1.033 — ABNORMAL HIGH (ref 1.005–1.030)
pH: 5 (ref 5.0–8.0)

## 2024-01-31 LAB — I-STAT VENOUS BLOOD GAS, ED
Acid-base deficit: 3 mmol/L — ABNORMAL HIGH (ref 0.0–2.0)
Bicarbonate: 21.2 mmol/L (ref 20.0–28.0)
Calcium, Ion: 1.16 mmol/L (ref 1.15–1.40)
HCT: 49 % (ref 39.0–52.0)
Hemoglobin: 16.7 g/dL (ref 13.0–17.0)
O2 Saturation: 73 %
Potassium: 4 mmol/L (ref 3.5–5.1)
Sodium: 140 mmol/L (ref 135–145)
TCO2: 22 mmol/L (ref 22–32)
pCO2, Ven: 36 mmHg — ABNORMAL LOW (ref 44–60)
pH, Ven: 7.378 (ref 7.25–7.43)
pO2, Ven: 39 mmHg (ref 32–45)

## 2024-01-31 LAB — COMPREHENSIVE METABOLIC PANEL WITH GFR
ALT: 19 U/L (ref 0–44)
AST: 13 U/L — ABNORMAL LOW (ref 15–41)
Albumin: 3.5 g/dL (ref 3.5–5.0)
Alkaline Phosphatase: 94 U/L (ref 38–126)
Anion gap: 16 — ABNORMAL HIGH (ref 5–15)
BUN: 9 mg/dL (ref 6–20)
CO2: 19 mmol/L — ABNORMAL LOW (ref 22–32)
Calcium: 9.2 mg/dL (ref 8.9–10.3)
Chloride: 102 mmol/L (ref 98–111)
Creatinine, Ser: 0.9 mg/dL (ref 0.61–1.24)
GFR, Estimated: >60 mL/min (ref >60)
Glucose, Bld: 274 mg/dL — ABNORMAL HIGH (ref 70–99)
Potassium: 3.8 mmol/L (ref 3.5–5.1)
Sodium: 137 mmol/L (ref 135–145)
Total Bilirubin: 1.3 mg/dL — ABNORMAL HIGH (ref 0.0–1.2)
Total Protein: 9.2 g/dL — ABNORMAL HIGH (ref 6.5–8.1)

## 2024-01-31 LAB — CBG MONITORING, ED
Glucose-Capillary: 275 mg/dL — ABNORMAL HIGH (ref 70–99)
Glucose-Capillary: 250 mg/dL — ABNORMAL HIGH (ref 70–99)
Glucose-Capillary: 249 mg/dL — ABNORMAL HIGH (ref 70–99)

## 2024-01-31 LAB — BETA-HYDROXYBUTYRIC ACID: Beta-Hydroxybutyric Acid: 3.1 mmol/L — ABNORMAL HIGH (ref 0.05–0.27)

## 2024-01-31 LAB — LIPASE, BLOOD: Lipase: 20 U/L (ref 11–51)

## 2024-01-31 MED ORDER — INSULIN GLARGINE 100 UNIT/ML SOLOSTAR PEN: 40.0000 [IU] | PEN_INJECTOR | Freq: Every day | SUBCUTANEOUS | 1 refills | Status: DC

## 2024-01-31 MED ORDER — LACTATED RINGERS IV BOLUS
1000.0000 mL | Freq: Once | INTRAVENOUS | Status: AC
  Administered 2024-01-31: 1000 mL via INTRAVENOUS

## 2024-01-31 MED ORDER — METOPROLOL TARTRATE 25 MG PO TABS: 12.5000 mg | ORAL_TABLET | Freq: Two times a day (BID) | ORAL | 0 refills | Status: DC

## 2024-01-31 MED ORDER — INSULIN LISPRO (1 UNIT DIAL) 100 UNIT/ML (KWIKPEN): 10.0000 [IU] | PEN_INJECTOR | Freq: Three times a day (TID) | SUBCUTANEOUS | 1 refills | Status: DC

## 2024-01-31 MED ORDER — INSULIN ASPART 100 UNIT/ML IJ SOLN
10.0000 [IU] | Freq: Once | INTRAMUSCULAR | Status: AC
  Administered 2024-01-31: 10 [IU] via INTRAVENOUS
  Filled 2024-01-31: qty 10

## 2024-01-31 MED ORDER — FLUOXETINE HCL 20 MG PO CAPS: 20.0000 mg | ORAL_CAPSULE | Freq: Every morning | ORAL | 0 refills | Status: DC

## 2024-01-31 MED ORDER — HYDROXYZINE HCL 25 MG PO TABS: 25.0000 mg | ORAL_TABLET | Freq: Two times a day (BID) | ORAL | 0 refills | Status: DC | PRN

## 2024-01-31 MED ORDER — ATORVASTATIN CALCIUM 20 MG PO TABS: 20.0000 mg | ORAL_TABLET | Freq: Every day | ORAL | 0 refills | Status: DC

## 2024-01-31 MED ORDER — TECHLITE PEN NEEDLES 31G X 8 MM MISC: 1.0000 | Freq: Three times a day (TID) | 2 refills | Status: DC

## 2024-01-31 NOTE — ED Provider Notes (Signed)
 [place] EMERGENCY DEPARTMENT AT [facility] Provider Note   CSN: [id number] Arrival date & time: 01/31/24  1434     Patient presents with: No chief complaint on file.   Dylan Watts is a 33 y.o. male.   HPI Dylan Watts , a 33 y.o. male  was evaluated in triage.  Pt complains of abd pain. Pain across abd with n/v/d since yesterday afternoon.  Can't keep anything down.  No fever, chills, cp, sob, dysuria.  Does have hx of DM but ran out of insulin  several months ago.  Denies alcohol abuse or marijuana use.      Prior to Admission medications   Medication Sig Start Date End Date Taking? Authorizing Provider  atorvastatin  (LIPITOR) 20 MG tablet TAKE 1 TABLET BY MOUTH EVERY DAY 09/13/23   Jaycee Greig PARAS, NP  FLUoxetine  (PROZAC ) 20 MG capsule Take 1 capsule (20 mg total) by mouth every morning. 12/04/23   Jaycee Greig PARAS, NP  hydrOXYzine  (ATARAX ) 25 MG tablet Take 1 tablet (25 mg total) by mouth 2 (two) times daily as needed. 12/04/23   Jaycee Greig PARAS, NP  insulin  glargine (LANTUS ) 100 UNIT/ML Solostar Pen Inject 40 Units into the skin daily. 12/04/23   Jaycee Greig PARAS, NP  insulin  lispro (HUMALOG  KWIKPEN) 100 UNIT/ML KwikPen Inject 10 Units into the skin 3 (three) times daily with meals. 12/04/23   Jaycee Greig PARAS, NP  Insulin  Pen Needle (TECHLITE PEN NEEDLES) 31G X 8 MM MISC 1 each by Other route 4 (four) times daily -  before meals and at bedtime. 08/13/23   Jaycee Greig PARAS, NP  metFORMIN  (GLUCOPHAGE ) 1000 MG tablet Take 1 tablet (1,000 mg total) by mouth daily with breakfast. Patient not taking: Reported on 09/02/2023 09/02/22   Clapacs, Norleen DASEN, MD  metoprolol  tartrate (LOPRESSOR ) 25 MG tablet TAKE 0.5 TABLETS BY MOUTH 2 TIMES DAILY. 10/14/23   Jaycee Greig PARAS, NP  promethazine -dextromethorphan (PROMETHAZINE -DM) 6.25-15 MG/5ML syrup Take 5 mLs by mouth 3 (three) times daily as needed for cough. Patient not taking: Reported on 09/02/2023 05/29/23   Raspet, Erin K, PA-C  traZODone  (DESYREL ) 50  MG tablet Take 1 tablet (50 mg total) by mouth at bedtime as needed for sleep. Patient not taking: Reported on 09/02/2023 09/01/22   Clapacs, Norleen DASEN, MD  insulin  aspart (NOVOLOG ) 100 UNIT/ML injection Inject 10 Units into the skin 3 (three) times daily with meals. 09/01/22 09/01/22  Clapacs, Norleen DASEN, MD    Allergies: Patient has no known allergies.    Review of Systems  Updated Vital Signs BP 104/65 (BP Location: Right Arm)   Pulse (!) 116   Temp 97.8 F (36.6 C) (Oral)   Resp 15   SpO2 100%   Physical Exam Vitals and nursing note reviewed.  Constitutional:      General: He is not in acute distress.    Appearance: He is well-developed.  HENT:     Head: Normocephalic and atraumatic.  Eyes:     Conjunctiva/sclera: Conjunctivae normal.  Cardiovascular:     Rate and Rhythm: Regular rhythm. Tachycardia present.  Pulmonary:     Effort: Pulmonary effort is normal. No respiratory distress.     Breath sounds: No stridor.  Abdominal:     General: There is no distension.  Skin:    General: Skin is warm and dry.  Neurological:     Mental Status: He is alert and oriented to person, place, and time.     (all labs ordered are  listed, but only abnormal results are displayed) Labs Reviewed  CBC WITH DIFFERENTIAL/PLATELET - Abnormal; Notable for the following components:      Result Value   WBC 13.7 (*)    Neutro Abs 11.1 (*)    Abs Immature Granulocytes 0.10 (*)    All other components within normal limits  COMPREHENSIVE METABOLIC PANEL WITH GFR - Abnormal; Notable for the following components:   CO2 19 (*)    Glucose, Bld 274 (*)    Total Protein 9.2 (*)    AST 13 (*)    Total Bilirubin 1.3 (*)    Anion gap 16 (*)    All other components within normal limits  URINALYSIS, ROUTINE W REFLEX MICROSCOPIC - Abnormal; Notable for the following components:   APPearance HAZY (*)    Specific Gravity, Urine 1.033 (*)    Glucose, UA >=500 (*)    Ketones, ur 80 (*)    Protein, ur 100 (*)     All other components within normal limits  BETA-HYDROXYBUTYRIC ACID - Abnormal; Notable for the following components:   Beta-Hydroxybutyric Acid 3.10 (*)    All other components within normal limits  I-STAT VENOUS BLOOD GAS, ED - Abnormal; Notable for the following components:   pCO2, Ven 36.0 (*)    Acid-base deficit 3.0 (*)    All other components within normal limits  CBG MONITORING, ED - Abnormal; Notable for the following components:   Glucose-Capillary 275 (*)    All other components within normal limits  CBG MONITORING, ED - Abnormal; Notable for the following components:   Glucose-Capillary 250 (*)    All other components within normal limits  CBG MONITORING, ED - Abnormal; Notable for the following components:   Glucose-Capillary 249 (*)    All other components within normal limits  LIPASE, BLOOD    EKG: EKG Interpretation Date/Time:  Friday January 31 2024 16:11:22 EST Ventricular Rate:  130 PR Interval:  134 QRS Duration:  70 QT Interval:  288 QTC Calculation: 423 R Axis:   64  Text Interpretation: Sinus tachycardia Anterior infarct , age undetermined Abnormal ECG Confirmed by Garrick Charleston [phone]) on 01/31/2024 8:35:35 PM  Radiology: No results found.   Procedures   Medications Ordered in the ED  lactated ringers  bolus 1,000 mL (0 mLs Intravenous Stopped 01/31/24 2209)  insulin  aspart (novoLOG ) injection 10 Units (10 Units Intravenous Given 01/31/24 2132)  lactated ringers  bolus 1,000 mL (1,000 mLs Intravenous New Bag/Given 01/31/24 2240)                                    Medical Decision Making Adult male with insulin -dependent diabetes now out of his medication for 2 months presents with nausea, vomiting x 1 day, on exam is awake, alert, afebrile, but is tachycardic.  He has a soft, nonperitoneal abdomen. Broad differential including DKA, ketotic hyperosmolar state, dehydration, electrolyte derangement, infection. Patient received fluid  resuscitation empirically, insulin  as his initial glucose was 275.  On cardiac: 5 sinus tach abnormal pulse ox 100% room air normal  Amount and/or Complexity of Data Reviewed Independent Historian: parent External Data Reviewed: notes. Labs: ordered. Decision-making details documented in ED Course. ECG/medicine tests: ordered and independent interpretation performed. Decision-making details documented in ED Course.  Risk OTC drugs. Prescription drug management.  No acidosis on VBG, patient does have ketone urea, mild hyperglycemia, mild gap, and with tachycardia, patient has received fluid, insulin  as  above.  10:53 PM Glucose 250, patient feeling better, no additional vomiting.  We had a lengthy conversation about resumption of the patient's insulin  protocol, discussed admission, the patient has a preference for going home and given his improvement here, absence of evidence for DKA, tolerance of medications, reasonable.    Final diagnoses:  Hyperglycemia  Bilious vomiting with nausea     Garrick Charleston, MD 01/31/24 [id number]

## 2024-01-31 NOTE — ED Triage Notes (Addendum)
 Pt c/o vomiting, diarrhea, and abdominal cramping since yesterday; denies fevers; hx DM, states he has not been able to get meds  Pt gives verbal consent for mse

## 2024-01-31 NOTE — Discharge Instructions (Addendum)
 You have been prescribed all of your medications.  It is important to obtain and take them as prescribed.  Return here for concerning changes in your condition.

## 2024-01-31 NOTE — ED Provider Triage Note (Signed)
 Emergency Medicine Provider Triage Evaluation Note  Dylan Watts , a 33 y.o. male  was evaluated in triage.  Pt complains of abd pain. Pain across abd with n/v/d since yesterday afternoon.  Can't keep anything down.  No fever, chills, cp, sob, dysuria.  Does have hx of DM but ran out of insulin  several months ago.  Denies alcohol abuse or marijuana use  Review of Systems  Positive: As above Negative: As above  Physical Exam  BP (!) 144/102 (BP Location: Right Arm)   Pulse (!) 125   Temp 98.1 F (36.7 C)   Resp 17   SpO2 98%  Gen:   Awake, no distress   Resp:  Normal effort  MSK:   Moves extremities without difficulty  Other:  tachy  Medical Decision Making  Medically screening exam initiated at 3:59 PM.  Appropriate orders placed.  Aziz Slape was informed that the remainder of the evaluation will be completed by another provider, this initial triage assessment does not replace that evaluation, and the importance of remaining in the ED until their evaluation is complete.     Nivia Colon, PA-C 01/31/24 1600

## 2024-02-24 ENCOUNTER — Encounter: Payer: Self-pay | Admitting: Family

## 2024-02-24 ENCOUNTER — Ambulatory Visit: Admitting: Family

## 2024-02-24 VITALS — BP 137/86 | HR 97 | Temp 98.3°F | Resp 18 | Ht 73.0 in | Wt 346.0 lb

## 2024-02-24 DIAGNOSIS — F419 Anxiety disorder, unspecified: Secondary | ICD-10-CM

## 2024-02-24 DIAGNOSIS — Z0189 Encounter for other specified special examinations: Secondary | ICD-10-CM

## 2024-02-24 DIAGNOSIS — E1165 Type 2 diabetes mellitus with hyperglycemia: Secondary | ICD-10-CM

## 2024-02-24 DIAGNOSIS — Z0100 Encounter for examination of eyes and vision without abnormal findings: Secondary | ICD-10-CM

## 2024-02-24 DIAGNOSIS — I10 Essential (primary) hypertension: Secondary | ICD-10-CM | POA: Diagnosis not present

## 2024-02-24 DIAGNOSIS — E119 Type 2 diabetes mellitus without complications: Secondary | ICD-10-CM

## 2024-02-24 DIAGNOSIS — E785 Hyperlipidemia, unspecified: Secondary | ICD-10-CM

## 2024-02-24 DIAGNOSIS — Z794 Long term (current) use of insulin: Secondary | ICD-10-CM

## 2024-02-24 DIAGNOSIS — F32A Depression, unspecified: Secondary | ICD-10-CM

## 2024-02-24 MED ORDER — INSULIN GLARGINE 100 UNIT/ML SOLOSTAR PEN: 40.0000 [IU] | PEN_INJECTOR | Freq: Every day | SUBCUTANEOUS | 2 refills | Status: DC

## 2024-02-24 MED ORDER — INSULIN LISPRO (1 UNIT DIAL) 100 UNIT/ML (KWIKPEN): 10.0000 [IU] | PEN_INJECTOR | Freq: Three times a day (TID) | SUBCUTANEOUS | 2 refills | Status: AC

## 2024-02-24 MED ORDER — METOPROLOL TARTRATE 25 MG PO TABS: 12.5000 mg | ORAL_TABLET | Freq: Two times a day (BID) | ORAL | 0 refills | Status: AC

## 2024-02-24 MED ORDER — ATORVASTATIN CALCIUM 20 MG PO TABS: 20.0000 mg | ORAL_TABLET | Freq: Every day | ORAL | 0 refills | Status: AC

## 2024-02-24 MED ORDER — FLUOXETINE HCL 20 MG PO CAPS: 20.0000 mg | ORAL_CAPSULE | Freq: Every morning | ORAL | 0 refills | Status: AC

## 2024-02-24 MED ORDER — HYDROXYZINE HCL 25 MG PO TABS: 25.0000 mg | ORAL_TABLET | Freq: Two times a day (BID) | ORAL | 1 refills | Status: AC | PRN

## 2024-02-24 MED ORDER — TECHLITE PEN NEEDLES 31G X 8 MM MISC: 1.0000 | Freq: Three times a day (TID) | 11 refills | Status: AC

## 2024-02-24 NOTE — Progress Notes (Signed)
 4 month follow up

## 2024-02-24 NOTE — Progress Notes (Signed)
 Patient ID: Dylan Watts, male    DOB: 27-Oct-1990  MRN: [id number]  CC: Chronic Conditions Follow-Up  Subjective: Dylan Watts is a 33 y.o. male who presents for chronic conditions follow-up.   His concerns today include:  - Doing well on Metoprolol  Tartrate, no issues/concerns. He does not complain of red flag symptoms such as but not limited to chest pain, shortness of breath, worst headache of life, nausea/vomiting.  - Doing well on Insulin  Glargine and Insulin  Lispro, no issues/concerns. Denies red flag symptoms associated with diabetes.  - Due for diabetic eye exam.  - States up to date on diabetic foot exam. - Doing well on Atorvastatin , no issues/concerns.  - Doing well on Fluoxetine  and Hydroxyzine , no issues/concerns. He denies thoughts of self-harm, suicidal ideations, homicidal ideations.  Patient Active Problem List   Diagnosis Date Noted   Major depressive disorder 08/29/2022   MDD (major depressive disorder) 08/29/2022   Diabetes mellitus (HCC) 08/29/2022   Self-inflicted laceration of left wrist (HCC) 08/29/2022   DKA (diabetic ketoacidosis) (HCC) 04/30/2019   Hyperlipidemia 04/30/2019   Nausea & vomiting 04/30/2019   Hypertension 04/30/2019   Obesity, Class III, BMI 40-49.9 (morbid obesity) (HCC) 09/08/2017   Leukocytosis 09/08/2017   AKI (acute kidney injury) 09/08/2017   Hypokalemia 09/08/2017   Hypophosphatemia 09/08/2017   Diabetic ketoacidosis (HCC) 09/07/2017     Current Outpatient Medications on File Prior to Visit  Medication Sig Dispense Refill   [DISCONTINUED] insulin  aspart (NOVOLOG ) 100 UNIT/ML injection Inject 10 Units into the skin 3 (three) times daily with meals. 10 mL 11   No current facility-administered medications on file prior to visit.    No Known Allergies  Social History   Socioeconomic History   Marital status: Single    Spouse name: Not on file   Number of children: Not on file   Years of education: Not on file   Highest  education level: Not on file  Occupational History   Not on file  Tobacco Use   Smoking status: Never   Smokeless tobacco: Never  Vaping Use   Vaping status: Never Used  Substance and Sexual Activity   Alcohol use: Yes    Comment: socially   Drug use: No   Sexual activity: Yes    Birth control/protection: Condom  Other Topics Concern   Not on file  Social History Narrative   Not on file   Social Drivers of Health   Financial Resource Strain: High Risk (09/18/2023)   Overall Financial Resource Strain (CARDIA)    Difficulty of Paying Living Expenses: Hard  Food Insecurity: No Food Insecurity (09/18/2023)   Hunger Vital Sign    Worried About Running Out of Food in the Last Year: Never true    Ran Out of Food in the Last Year: Never true  Transportation Needs: No Transportation Needs (09/18/2023)   PRAPARE - Administrator, Civil Service (Medical): No    Lack of Transportation (Non-Medical): No  Physical Activity: Insufficiently Active (08/13/2023)   Exercise Vital Sign    Days of Exercise per Week: 2 days    Minutes of Exercise per Session: 30 min  Stress: Stress Concern Present (08/13/2023)   [employer] of Occupational Health - Occupational Stress Questionnaire    Feeling of Stress : Very much  Social Connections: Socially Isolated (08/13/2023)   Social Connection and Isolation Panel    Frequency of Communication with Friends and Family: More than three times a week  Frequency of Social Gatherings with Friends and Family: Twice a week    Attends Religious Services: Never    Database Administrator or Organizations: No    Attends Banker Meetings: Never    Marital Status: Never married  Intimate Partner Violence: Not At Risk (09/18/2023)   Humiliation, Afraid, Rape, and Kick questionnaire    Fear of Current or Ex-Partner: No    Emotionally Abused: No    Physically Abused: No    Sexually Abused: No    Family History  Problem Relation Age of  Onset   Diabetes Mother    Diabetes Father    Lupus Father    Diabetes Maternal Grandmother    Hypertension Maternal Grandmother    Diabetes Paternal Grandmother    Stroke Paternal Grandmother    Hypertension Paternal Grandmother     Past Surgical History:  Procedure Laterality Date   NO PAST SURGERIES      ROS: Review of Systems Negative except as stated above  PHYSICAL EXAM: BP 137/86   Pulse 97   Temp 98.3 F (36.8 C) (Oral)   Resp 18   Ht 6' 1 (1.854 m)   Wt (!) 346 lb (156.9 kg)   SpO2 93%   BMI 45.65 kg/m   Physical Exam HENT:     Head: Normocephalic and atraumatic.     Nose: Nose normal.     Mouth/Throat:     Mouth: Mucous membranes are moist.     Pharynx: Oropharynx is clear.  Eyes:     Extraocular Movements: Extraocular movements intact.     Conjunctiva/sclera: Conjunctivae normal.     Pupils: Pupils are equal, round, and reactive to light.  Cardiovascular:     Rate and Rhythm: Normal rate and regular rhythm.     Pulses: Normal pulses.     Heart sounds: Normal heart sounds.  Pulmonary:     Effort: Pulmonary effort is normal.     Breath sounds: Normal breath sounds.  Musculoskeletal:        General: Normal range of motion.     Cervical back: Normal range of motion and neck supple.  Neurological:     General: No focal deficit present.     Mental Status: He is alert and oriented to person, place, and time.  Psychiatric:        Mood and Affect: Mood normal.        Behavior: Behavior normal.    ASSESSMENT AND PLAN: 1. Primary hypertension (Primary) - Continue Metoprolol  Tartrate as prescribed.  - Counseled on blood pressure goal of less than 130/80, low-sodium, DASH diet, medication compliance, and 150 minutes of moderate intensity exercise per week as tolerated. Counseled on medication adherence and adverse effects. - Follow-up with primary provider in 3 months or sooner if needed. - metoprolol  tartrate (LOPRESSOR ) 25 MG tablet; Take 0.5 tablets  (12.5 mg total) by mouth 2 (two) times daily.  Dispense: 90 tablet; Refill: 0  2. Type 2 diabetes mellitus with hyperglycemia, with long-term current use of insulin  (HCC) - Hemoglobin A1c result pending.  - Continue Insulin  Glargine and Insulin  Lispro as prescribed.  - Discussed the importance of healthy eating habits, low-carbohydrate diet, low-sugar diet, regular aerobic exercise (at least 150 minutes a week as tolerated) and medication compliance to achieve or maintain control of diabetes. Counseled on medication adherence/adverse effects.  - Follow-up with primary provider as scheduled. - Hemoglobin A1c - Insulin  Pen Needle (TECHLITE PEN NEEDLES) 31G X 8 MM MISC; 1  each by Other route 4 (four) times daily -  before meals and at bedtime.  Dispense: 100 each; Refill: 11 - insulin  lispro (HUMALOG  KWIKPEN) 100 UNIT/ML KwikPen; Inject 10 Units into the skin 3 (three) times daily with meals.  Dispense: 15 mL; Refill: 2 - insulin  glargine (LANTUS ) 100 UNIT/ML Solostar Pen; Inject 40 Units into the skin daily.  Dispense: 15 mL; Refill: 2  3. Diabetic eye exam [facility]) - Referral to Ophthalmology for evaluation/management.  - Ambulatory referral to Ophthalmology  4. Encounter for diabetic foot exam [facility]) - Patient reports up to date.   5. Hyperlipidemia, unspecified hyperlipidemia type - Continue Atorvastatin  as prescribed. Counseled on medication adherence/adverse effects.  - Follow-up with primary provider in 3 months or sooner if needed. - atorvastatin  (LIPITOR) 20 MG tablet; Take 1 tablet (20 mg total) by mouth daily.  Dispense: 90 tablet; Refill: 0  6. Anxiety and depression - Patient denies thoughts of self-harm, suicidal ideations, homicidal ideations. - Continue Fluoxetine  and Hydroxyzine  as prescribed. Counseled on medication adherence/adverse effects.  - Follow-up with primary provider in 3 months or sooner if needed.  - FLUoxetine  (PROZAC ) 20 MG capsule; Take 1 capsule (20 mg total) by  mouth every morning.  Dispense: 90 capsule; Refill: 0 - hydrOXYzine  (ATARAX ) 25 MG tablet; Take 1 tablet (25 mg total) by mouth 2 (two) times daily as needed.  Dispense: 90 tablet; Refill: 1   Patient was given the opportunity to ask questions.  Patient verbalized understanding of the plan and was able to repeat key elements of the plan. Patient was given clear instructions to go to Emergency Department or return to medical center if symptoms don't improve, worsen, or new problems develop.The patient verbalized understanding.   Orders Placed This Encounter  Procedures   Hemoglobin A1c   Ambulatory referral to Ophthalmology     Requested Prescriptions   Signed Prescriptions Disp Refills   metoprolol  tartrate (LOPRESSOR ) 25 MG tablet 90 tablet 0    Sig: Take 0.5 tablets (12.5 mg total) by mouth 2 (two) times daily.   Insulin  Pen Needle (TECHLITE PEN NEEDLES) 31G X 8 MM MISC 100 each 11    Sig: 1 each by Other route 4 (four) times daily -  before meals and at bedtime.   insulin  lispro (HUMALOG  KWIKPEN) 100 UNIT/ML KwikPen 15 mL 2    Sig: Inject 10 Units into the skin 3 (three) times daily with meals.   insulin  glargine (LANTUS ) 100 UNIT/ML Solostar Pen 15 mL 2    Sig: Inject 40 Units into the skin daily.   atorvastatin  (LIPITOR) 20 MG tablet 90 tablet 0    Sig: Take 1 tablet (20 mg total) by mouth daily.   FLUoxetine  (PROZAC ) 20 MG capsule 90 capsule 0    Sig: Take 1 capsule (20 mg total) by mouth every morning.   hydrOXYzine  (ATARAX ) 25 MG tablet 90 tablet 1    Sig: Take 1 tablet (25 mg total) by mouth 2 (two) times daily as needed.    Return in about 3 months (around 05/24/2024) for Follow-Up or next available chronic conditions.  Greig JINNY Chute, NP

## 2024-02-25 ENCOUNTER — Ambulatory Visit: Payer: Self-pay | Admitting: Family

## 2024-02-25 DIAGNOSIS — Z794 Long term (current) use of insulin: Secondary | ICD-10-CM

## 2024-02-25 LAB — HEMOGLOBIN A1C
Est. average glucose Bld gHb Est-mCnc: 303 mg/dL
Hgb A1c MFr Bld: 12.2 % — ABNORMAL HIGH (ref 4.8–5.6)

## 2024-02-25 MED ORDER — INSULIN GLARGINE 100 UNIT/ML SOLOSTAR PEN: 45.0000 [IU] | PEN_INJECTOR | Freq: Every day | SUBCUTANEOUS | 2 refills | Status: AC

## 2024-03-10 ENCOUNTER — Ambulatory Visit
Admission: RE | Admit: 2024-03-10 | Discharge: 2024-03-10 | Disposition: A | Discharge status: Home / Self Care | Payer: Self-pay

## 2024-03-10 ENCOUNTER — Other Ambulatory Visit: Payer: Self-pay

## 2024-03-10 VITALS — BP 130/87 | HR 104 | Temp 98.3°F | Resp 20 | Wt 350.8 lb

## 2024-03-10 DIAGNOSIS — S61452A Open bite of left hand, initial encounter: Secondary | ICD-10-CM | POA: Diagnosis not present

## 2024-03-10 DIAGNOSIS — W5501XA Bitten by cat, initial encounter: Secondary | ICD-10-CM

## 2024-03-10 MED ORDER — AMOXICILLIN-POT CLAVULANATE 875-125 MG PO TABS: 1.0000 | ORAL_TABLET | Freq: Two times a day (BID) | ORAL | 0 refills | Status: AC

## 2024-03-10 MED ORDER — RABIES IMMUNE GLOBULIN 300 UNIT/2ML IJ SOLN
20.0000 [IU]/kg | Freq: Once | INTRAMUSCULAR | Status: AC
  Administered 2024-03-10: 3300 [IU] via INTRAMUSCULAR

## 2024-03-10 MED ORDER — RABIES VIRUS VACCINE, HDC IM SUSR
1.0000 mL | Freq: Once | INTRAMUSCULAR | Status: AC
  Administered 2024-03-10: 1 mL via INTRAMUSCULAR

## 2024-03-10 NOTE — ED Provider Notes (Addendum)
 " [facility]    CSN: [id number] Arrival date & time: 03/10/24  0859      History   Chief Complaint Chief Complaint  Patient presents with   Appointment    HPI Dylan Watts is a 33 y.o. male.   Pt presents today due to being bitten by a stray cat at work on Sunday. 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 He attempted to shoo the cat away and he was bitten on the palm of left hand. Pt states that he has never seen the cat before, denies contacting animal control. Pt states that his last tetanus was in 2024.      Past Medical History:  Diagnosis Date   Diabetes mellitus without complication (HCC)    DKA (diabetic ketoacidoses) 04/2019    Patient Active Problem List   Diagnosis Date Noted   Major depressive disorder 08/29/2022   MDD (major depressive disorder) 08/29/2022   Diabetes mellitus (HCC) 08/29/2022   Self-inflicted laceration of left wrist (HCC) 08/29/2022   DKA (diabetic ketoacidosis) (HCC) 04/30/2019   Hyperlipidemia 04/30/2019   Nausea & vomiting 04/30/2019   Hypertension 04/30/2019   Obesity, Class III, BMI 40-49.9 (morbid obesity) (HCC) 09/08/2017   Leukocytosis 09/08/2017   AKI (acute kidney injury) 09/08/2017   Hypokalemia 09/08/2017   Hypophosphatemia 09/08/2017   Diabetic ketoacidosis (HCC) 09/07/2017    Past Surgical History:  Procedure Laterality Date   NO PAST SURGERIES         Home Medications    Prior to Admission medications  Medication Sig Start Date End Date Taking? Authorizing Provider  amoxicillin -clavulanate (AUGMENTIN ) 875-125 MG tablet Take 1 tablet by mouth every 12 (twelve) hours for 7 days. 03/10/24 03/17/24 Yes Andra Krabbe C, PA-C  atorvastatin  (LIPITOR) 20 MG tablet Take 1 tablet (20 mg total) by mouth daily. 02/24/24   Jaycee Greig PARAS, NP  FLUoxetine  (PROZAC ) 20 MG capsule Take 1 capsule (20 mg total) by mouth every morning. 02/24/24    Jaycee Greig PARAS, NP  hydrOXYzine  (ATARAX ) 25 MG tablet Take 1 tablet (25 mg total) by mouth 2 (two) times daily as needed. 02/24/24   Jaycee Greig PARAS, NP  insulin  glargine (LANTUS ) 100 UNIT/ML Solostar Pen Inject 45 Units into the skin daily. 02/25/24   Jaycee Greig PARAS, NP  insulin  lispro (HUMALOG  KWIKPEN) 100 UNIT/ML KwikPen Inject 10 Units into the skin 3 (three) times daily with meals. 02/24/24   Jaycee Greig PARAS, NP  Insulin  Pen Needle (TECHLITE PEN NEEDLES) 31G X 8 MM MISC 1 each by Other route 4 (four) times daily -  before meals and at bedtime. 02/24/24   Jaycee Greig PARAS, NP  metoprolol  tartrate (LOPRESSOR ) 25 MG tablet Take 0.5 tablets (12.5 mg total) by mouth 2 (two) times daily. 02/24/24   Jaycee Greig PARAS, NP  insulin  aspart (NOVOLOG ) 100 UNIT/ML injection Inject 10 Units into the skin 3 (three) times daily with meals. 09/01/22 09/01/22  Clapacs, Norleen DASEN, MD    Family History Family History  Problem Relation Age of Onset   Diabetes Mother    Diabetes Father    Lupus Father    Diabetes Maternal Grandmother    Hypertension Maternal Grandmother    Diabetes Paternal Grandmother    Stroke Paternal Grandmother    Hypertension Paternal Grandmother     Social History Social History[1]   Allergies   Patient has no known allergies.   Review  of Systems Review of Systems   Physical Exam Triage Vital Signs ED Triage Vitals  Encounter Vitals Group     BP 03/10/24 0943 130/87     Girls Systolic BP Percentile --      Girls Diastolic BP Percentile --      Boys Systolic BP Percentile --      Boys Diastolic BP Percentile --      Pulse Rate 03/10/24 0943 (!) 104     Resp 03/10/24 0943 20     Temp 03/10/24 0943 98.3 F (36.8 C)     Temp Source 03/10/24 0943 Oral     SpO2 03/10/24 0943 96 %     Weight 03/10/24 0938 (!) 350 lb 12.8 oz (159.1 kg)     Height --      Head Circumference --      Peak Flow --      Pain Score 03/10/24 0940 0     Pain Loc --      Pain Education --      Exclude from  Growth Chart --    No data found.  Updated Vital Signs BP 130/87 (BP Location: Right Arm) Comment (BP Location): large cuff  Pulse (!) 104   Temp 98.3 F (36.8 C) (Oral)   Resp 20   Wt (!) 350 lb 12.8 oz (159.1 kg)   SpO2 96%   BMI 46.28 kg/m   Visual Acuity Right Eye Distance:   Left Eye Distance:   Bilateral Distance:    Right Eye Near:   Left Eye Near:    Bilateral Near:     Physical Exam Vitals and nursing note reviewed.  Constitutional:      General: He is not in acute distress.    Appearance: Normal appearance. He is not ill-appearing, toxic-appearing or diaphoretic.  Eyes:     General: No scleral icterus. Cardiovascular:     Rate and Rhythm: Normal rate and regular rhythm.     Heart sounds: Normal heart sounds.  Pulmonary:     Effort: Pulmonary effort is normal. No respiratory distress.     Breath sounds: Normal breath sounds. No wheezing or rhonchi.  Skin:    General: Skin is warm.     Comments: 2 puncture wounds noted of palm of left hand  Neurological:     Mental Status: He is alert and oriented to person, place, and time.  Psychiatric:        Mood and Affect: Mood normal.        Behavior: Behavior normal.      UC Treatments / Results  Labs (all labs ordered are listed, but only abnormal results are displayed) Labs Reviewed - No data to display  EKG   Radiology No results found.  Procedures Procedures (including critical care time)  Medications Ordered in UC Medications  rabies immune globulin  (HYPERRAB) injection 3,300 Units (has no administration in time range)  rabies vaccine, human diploid (IMOVAX) injection 1 mL (1 mL Intramuscular Given 03/10/24 1016)    Initial Impression / Assessment and Plan / UC Course  I have reviewed the triage vital signs and the nursing notes.  Pertinent labs & imaging results that were available during my care of the patient were reviewed by me and considered in my medical decision making (see chart for  details).     Final Clinical Impressions(s) / UC Diagnoses   Final diagnoses:  Cat bite of left hand, initial encounter     Discharge Instructions  You have been diagnosed with an abrasion/laceration that requires wound care.  -Use you have the area clean and dry -Change your bandage at least daily, keep bandage clean and dry.  If bandages become soiled or wet they need to be changed soon as possible. -Will get wound daily, if you notice color discharge, foul odor, or pale/gray/black discoloration of skin, increased redness, pain, or swelling you need to seek attention immediately. -You may use ibuprofen  and Tylenol  as needed for pain control.     ED Prescriptions     Medication Sig Dispense Auth. Provider   amoxicillin -clavulanate (AUGMENTIN ) 875-125 MG tablet Take 1 tablet by mouth every 12 (twelve) hours for 7 days. 14 tablet Andra Corean BROCKS, PA-C      PDMP not reviewed this encounter.    Andra Corean BROCKS, PA-C 03/10/24 1028     [1]  Social History Tobacco Use   Smoking status: Never   Smokeless tobacco: Never  Vaping Use   Vaping status: Some Days  Substance Use Topics   Alcohol use: Yes    Comment: socially   Drug use: No     Andra Corean BROCKS, PA-C 03/10/24 1030  "

## 2024-03-10 NOTE — Discharge Instructions (Signed)
 You have been diagnosed with an abrasion/laceration that requires wound care.  -Use you have the area clean and dry -Change your bandage at least daily, keep bandage clean and dry.  If bandages become soiled or wet they need to be changed soon as possible. -Will get wound daily, if you notice color discharge, foul odor, or pale/gray/black discoloration of skin, increased redness, pain, or swelling you need to seek attention immediately. -You may use ibuprofen and Tylenol as needed for pain control.

## 2024-03-10 NOTE — ED Triage Notes (Signed)
 Patient reports a stray cat walked up to patient, he tried to swat cat away and reports bite to left hand.  This occurred on Sunday, 12/21/20252 small punctures to skin on left palm.  Washed with peroxide.  Area itches.  2 small wounds are unremarkable  TDAP 2024 per epic

## 2024-03-13 ENCOUNTER — Ambulatory Visit
Admission: RE | Admit: 2024-03-13 | Discharge: 2024-03-13 | Disposition: A | Discharge status: Home / Self Care | Payer: Self-pay | Source: Ambulatory Visit

## 2024-03-13 DIAGNOSIS — Z23 Encounter for immunization: Secondary | ICD-10-CM | POA: Diagnosis not present

## 2024-03-13 DIAGNOSIS — Z203 Contact with and (suspected) exposure to rabies: Secondary | ICD-10-CM | POA: Diagnosis not present

## 2024-03-13 MED ORDER — RABIES VIRUS VACCINE, HDC IM SUSR
1.0000 mL | Freq: Once | INTRAMUSCULAR | Status: AC
  Administered 2024-03-13: 1 mL via INTRAMUSCULAR

## 2024-03-13 NOTE — ED Triage Notes (Signed)
 Pt here for 2nd rabies vaccineon correct date of series. Nurse visit. No other concerns. Bite site healing well.

## 2024-03-13 NOTE — ED Provider Notes (Signed)
 Patient presents today for rabies vaccine.   Andra Corean BROCKS, PA-C 03/13/24 1714

## 2024-03-18 ENCOUNTER — Ambulatory Visit: Payer: Self-pay

## 2024-03-20 ENCOUNTER — Ambulatory Visit

## 2024-03-23 ENCOUNTER — Ambulatory Visit

## 2024-05-26 ENCOUNTER — Ambulatory Visit: Admitting: Family

## 2024-08-07 ENCOUNTER — Ambulatory Visit: Admitting: Endocrinology
